# Patient Record
Sex: Female | Born: 1985
Health system: Southern US, Community
[De-identification: ages and names within clinical notes are randomized; demographics above are authoritative.]

## PROBLEM LIST (undated history)

## (undated) DIAGNOSIS — D509 Iron deficiency anemia, unspecified: Secondary | ICD-10-CM

## (undated) DIAGNOSIS — Z52819 Egg (Oocyte) donor, unspecified: Secondary | ICD-10-CM

## (undated) DIAGNOSIS — B019 Varicella without complication: Secondary | ICD-10-CM

## (undated) HISTORY — DX: Egg (oocyte) donor, unspecified: Z52.819

## (undated) HISTORY — DX: Iron deficiency anemia, unspecified: D50.9

## (undated) HISTORY — DX: Varicella without complication: B01.9

## (undated) HISTORY — PX: OTHER SURGICAL HISTORY: SHX169

---

## 2005-09-25 LAB — CONVERTED CEMR LAB: Pap Smear: NORMAL

## 2006-10-17 ENCOUNTER — Ambulatory Visit: Payer: Self-pay | Admitting: Internal Medicine

## 2006-10-17 LAB — CONVERTED CEMR LAB
ALT: 11 units/L (ref 0–40)
AST: 20 units/L (ref 0–37)
Alkaline Phosphatase: 40 units/L (ref 39–117)
Basophils Absolute: 0.1 10*3/uL (ref 0.0–0.1)
Basophils Relative: 1.2 % — ABNORMAL HIGH (ref 0.0–1.0)
Chloride: 105 meq/L (ref 96–112)
Cholesterol: 151 mg/dL (ref 0–200)
Eosinophils Relative: 1.5 % (ref 0.0–5.0)
Ferritin: 2.2 ng/mL — ABNORMAL LOW (ref 10.0–291.0)
GFR calc Af Amer: 115 mL/min
GFR calc non Af Amer: 95 mL/min
Glucose, Bld: 92 mg/dL (ref 70–99)
HCT: 25.9 % — ABNORMAL LOW (ref 36.0–46.0)
HDL: 36.8 mg/dL — ABNORMAL LOW (ref >39.0)
LDL Cholesterol: 93 mg/dL (ref 0–99)
Platelets: 262 10*3/uL (ref 150–400)
Potassium: 3.7 meq/L (ref 3.5–5.1)
RBC: 4.22 M/uL (ref 3.87–5.11)
RDW: 19.4 % — ABNORMAL HIGH (ref 11.5–14.6)
Sodium: 139 meq/L (ref 135–145)
Total Protein: 8.3 g/dL (ref 6.0–8.3)
VLDL: 22 mg/dL (ref 0–40)
WBC: 4.3 10*3/uL — ABNORMAL LOW (ref 4.5–10.5)

## 2006-10-18 ENCOUNTER — Ambulatory Visit: Payer: Self-pay | Admitting: Internal Medicine

## 2006-10-18 LAB — CONVERTED CEMR LAB
Hgb A2 Quant: 4.9 % — ABNORMAL HIGH (ref 2.2–3.2)
Hgb A: 93.2 % — ABNORMAL LOW (ref 96.8–97.8)

## 2006-10-22 ENCOUNTER — Encounter: Payer: Self-pay | Admitting: Internal Medicine

## 2006-10-24 ENCOUNTER — Ambulatory Visit: Payer: Self-pay | Admitting: Internal Medicine

## 2006-10-24 ENCOUNTER — Other Ambulatory Visit: Admission: RE | Admit: 2006-10-24 | Discharge: 2006-10-24 | Payer: Self-pay | Admitting: Internal Medicine

## 2006-10-24 ENCOUNTER — Encounter: Payer: Self-pay | Admitting: Internal Medicine

## 2006-10-24 LAB — CONVERTED CEMR LAB

## 2006-12-26 ENCOUNTER — Ambulatory Visit: Payer: Self-pay | Admitting: Internal Medicine

## 2007-03-06 ENCOUNTER — Ambulatory Visit: Payer: Self-pay | Admitting: Internal Medicine

## 2007-03-15 ENCOUNTER — Encounter: Payer: Self-pay | Admitting: Internal Medicine

## 2007-04-25 ENCOUNTER — Ambulatory Visit: Payer: Self-pay | Admitting: Internal Medicine

## 2007-04-25 DIAGNOSIS — D508 Other iron deficiency anemias: Secondary | ICD-10-CM

## 2007-04-25 DIAGNOSIS — L989 Disorder of the skin and subcutaneous tissue, unspecified: Secondary | ICD-10-CM | POA: Insufficient documentation

## 2007-05-06 ENCOUNTER — Telehealth (INDEPENDENT_AMBULATORY_CARE_PROVIDER_SITE_OTHER): Payer: Self-pay | Admitting: *Deleted

## 2007-10-11 ENCOUNTER — Ambulatory Visit: Payer: Self-pay | Admitting: Internal Medicine

## 2007-10-11 LAB — CONVERTED CEMR LAB
Bilirubin Urine: NEGATIVE
Urobilinogen, UA: 0.2

## 2007-10-13 LAB — CONVERTED CEMR LAB
Albumin: 3.7 g/dL (ref 3.5–5.2)
Alkaline Phosphatase: 33 units/L — ABNORMAL LOW (ref 39–117)
BUN: 8 mg/dL (ref 6–23)
Basophils Absolute: 0 10*3/uL (ref 0.0–0.1)
Cholesterol: 149 mg/dL (ref 0–200)
GFR calc Af Amer: 133 mL/min
HDL: 32.8 mg/dL — ABNORMAL LOW (ref >39.0)
Hemoglobin: 10.9 g/dL — ABNORMAL LOW (ref 12.0–15.0)
LDL Cholesterol: 94 mg/dL (ref 0–99)
Lymphocytes Relative: 47.6 % — ABNORMAL HIGH (ref 12.0–46.0)
MCHC: 32.7 g/dL (ref 30.0–36.0)
Monocytes Absolute: 0.3 10*3/uL (ref 0.2–0.7)
Monocytes Relative: 7.8 % (ref 3.0–11.0)
Neutro Abs: 1.7 10*3/uL (ref 1.4–7.7)
Platelets: 264 10*3/uL (ref 150–400)
Potassium: 4.5 meq/L (ref 3.5–5.1)
RDW: 23.5 % — ABNORMAL HIGH (ref 11.5–14.6)
Triglycerides: 111 mg/dL (ref 0–149)

## 2007-10-18 ENCOUNTER — Ambulatory Visit: Payer: Self-pay | Admitting: Internal Medicine

## 2007-10-18 ENCOUNTER — Other Ambulatory Visit: Admission: RE | Admit: 2007-10-18 | Discharge: 2007-10-18 | Payer: Self-pay | Admitting: Internal Medicine

## 2007-10-18 ENCOUNTER — Encounter: Payer: Self-pay | Admitting: Internal Medicine

## 2007-10-18 DIAGNOSIS — R51 Headache: Secondary | ICD-10-CM | POA: Insufficient documentation

## 2007-10-18 DIAGNOSIS — R519 Headache, unspecified: Secondary | ICD-10-CM | POA: Insufficient documentation

## 2007-12-17 ENCOUNTER — Ambulatory Visit: Payer: Self-pay | Admitting: Internal Medicine

## 2007-12-17 LAB — CONVERTED CEMR LAB: Hemoglobin: 11.3 g/dL

## 2009-02-02 ENCOUNTER — Ambulatory Visit: Payer: Self-pay | Admitting: Internal Medicine

## 2009-02-02 LAB — CONVERTED CEMR LAB
ALT: 10 units/L (ref 0–35)
Albumin: 4.1 g/dL (ref 3.5–5.2)
Basophils Absolute: 0 10*3/uL (ref 0.0–0.1)
CO2: 29 meq/L (ref 19–32)
Calcium: 9.4 mg/dL (ref 8.4–10.5)
Creatinine, Ser: 0.7 mg/dL (ref 0.4–1.2)
Eosinophils Absolute: 0 10*3/uL (ref 0.0–0.7)
Glucose, Bld: 87 mg/dL (ref 70–99)
Glucose, Urine, Semiquant: NEGATIVE
HDL: 36 mg/dL — ABNORMAL LOW (ref >39.00)
Hemoglobin: 11.5 g/dL — ABNORMAL LOW (ref 12.0–15.0)
Ketones, urine, test strip: NEGATIVE
Lymphocytes Relative: 52.1 % — ABNORMAL HIGH (ref 12.0–46.0)
MCHC: 33.2 g/dL (ref 30.0–36.0)
Monocytes Absolute: 0.3 10*3/uL (ref 0.1–1.0)
Neutro Abs: 1.1 10*3/uL — ABNORMAL LOW (ref 1.4–7.7)
Neutrophils Relative %: 35.7 % — ABNORMAL LOW (ref 43.0–77.0)
RDW: 25.9 % — ABNORMAL HIGH (ref 11.5–14.6)
Specific Gravity, Urine: >=1.03
TSH: 2.13 microintl units/mL (ref 0.35–5.50)
Total Bilirubin: 0.5 mg/dL (ref 0.3–1.2)
Total Protein: 7.9 g/dL (ref 6.0–8.3)
Triglycerides: 97 mg/dL (ref 0.0–149.0)
pH: 5.5

## 2009-02-09 ENCOUNTER — Encounter: Payer: Self-pay | Admitting: Internal Medicine

## 2009-02-09 ENCOUNTER — Ambulatory Visit: Payer: Self-pay | Admitting: Internal Medicine

## 2009-02-09 ENCOUNTER — Other Ambulatory Visit: Admission: RE | Admit: 2009-02-09 | Discharge: 2009-02-09 | Payer: Self-pay | Admitting: Internal Medicine

## 2009-05-21 ENCOUNTER — Ambulatory Visit: Payer: Self-pay | Admitting: Internal Medicine

## 2009-05-21 DIAGNOSIS — M542 Cervicalgia: Secondary | ICD-10-CM | POA: Insufficient documentation

## 2009-08-31 ENCOUNTER — Ambulatory Visit: Payer: Self-pay | Admitting: Internal Medicine

## 2009-08-31 ENCOUNTER — Other Ambulatory Visit: Admission: RE | Admit: 2009-08-31 | Discharge: 2009-08-31 | Payer: Self-pay | Admitting: Internal Medicine

## 2009-08-31 DIAGNOSIS — R87610 Atypical squamous cells of undetermined significance on cytologic smear of cervix (ASC-US): Secondary | ICD-10-CM

## 2009-08-31 LAB — CONVERTED CEMR LAB: Hemoglobin: 11.6 g/dL

## 2011-02-10 NOTE — Assessment & Plan Note (Signed)
[facility]                            [facility] OFFICE NOTE   NAME:[name]                        MRN:          [id number]  DATE:10/18/2006                            DOB:          1986/07/03    CHIEF COMPLAINT:  New patient to discuss lab.   HISTORY OF PRESENT ILLNESS:  [name] is a 25 year old nonsmoking,  single, African American female who comes today for first time visit.  Called in because of abnormal lab test by our office because she had had  lab work done for a new patient physical and her hemoglobin was 8.  She  states that she has probably always been anemic and her mother told her  that she had some kind of anemia at birth but she has been off and on  iron throughout her life, but has side effects of stomach aches and  constipation with that so she has not been taking iron for quite a  while.  She has apparently been able to get her hemoglobin up to the 10  or 11 range at some point in her life, and in 2003 however it was in the  6 range.  She has never had a transfusion or acute bleeding.  Her  periods are heavy off and on but last 7 days.  She had been on oral  contraceptives in the past.  The cyclic kind caused breakthrough  bleeding but was on Seasonale for at least a year and did well on that.  She stopped it because of expense.  She has not been on hormonal therapy  for the last 2 years.  She denies any personal easy bleeding or  bruising.  She also has some problems with headaches about 3 times a  month, lasts anywhere from 1-7 days.  Occur onset of pain around her eye  and then spreads, does get sensitivity to light with no nausea and  vomiting.  She has had these kind of headaches for quite a while, but is  more frequent recently.  She is taking ibuprofen 800 mg and occasional  Goody powder with some mild help.  She is scheduled to have a regular  checkup next week with a Pap smear.   PAST MEDICAL HISTORY:  See  database.  Anemia as above, frequent  headaches.  SURGERIES:  None.  She is primiparous.  Last Pap 2006, has had an abnormal LMP October 03, 2006.  Tetanus shot known, may be up to date.   MEDICATIONS:  None.   DRUG ALLERGIES:  NONE.   FAMILY HISTORY:  Positive for anemia in her mother, may be resolved when  she had her hysterectomy.  There is a question about easy bleeding in an  aunt, when she pricks her finger it never stops.  Grandparent with  breast cancer, others with heart disease, no known diabetes.  Mother  does have high triglycerides.   SOCIAL HISTORY:  Lives at home, household of 3.  Six to eight hours of  sleep.  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  Social alcohol.  Caffeine:  Goes  to [employer] 2-3 times a week, an occasional soda.  No tobacco.  She  does not eat red meat, but is not a vegetarian.  See database.   REVIEW OF SYSTEMS:  Negative for chest pain, shortness of breath,  bleeding.  However, she has never had good exercise tolerance, but there  has been no dramatic change most recently.  No adenopathy, fever, weight  loss, weight gain that she can not explain.  Rest of her is negative or  noncontributory.   OBJECTIVE:  Height 5 foot 1-1/2 inches, weight 146, pulse 60 and  regular, blood pressure 120/80.  Is a WDWN,  healthy appearing young adult in no acute distress.  HEENT:  Is grossly normal, eyes are nonicteric.  NECK:  Without masses or significant thyromegaly or nodules.  CHEST:  CTA, BS equal.  CARDIAC:  S1, S2, no gallops or murmurs are heard.  ABDOMEN:  Soft, without any organomegaly, guarding, or rebound.  LYMPH NODES:  No adenopathy.  SKIN:  No acute bruising or petechiae.  NEUROLOGIC:  Grossly intact.   LABORATORY:  So far shows a white count of 4300, hemoglobin of 8.1, MCV  61.5, RDW 19.4, generally normal diff.  Platelet count is 262.  Cholesterol 151, LDL 93 with HDL 36.8,  triglycerides 108.  Ratio is 4.1.  General chemistries normal as well as thyroid and urinalysis.   IMPRESSION:  1. Anemia.  Significant, microcytic.  May be combination of genetic      anemia plus iron deficiency with heavy periods and cardiovascular      adaptation.  No obvious history consistent with bleeding disorder      today and examination is unremarkable.  2. Headaches.  Probably migrainous, increasing frequency.  Examination      is nonrevealing.  Would suggest headache calendar, given to patient      today and will follow up this and avoid triggers such as caffeine      and alcohol.   I have encouraged her, in regard to her anemia, to still try taking iron  and iron rich foods.  In the meantime we will add a ferritin and  hemoglobin electrophoresis onto her labs previously drawn and follow up  at her next visit.     Neta Mends. Panosh, MD  Electronically Signed    WKP/MedQ  DD: 10/18/2006  DT: 10/18/2006  Job #: [id number]

## 2011-12-26 ENCOUNTER — Other Ambulatory Visit: Payer: Self-pay

## 2012-01-02 ENCOUNTER — Other Ambulatory Visit: Payer: Self-pay

## 2012-01-10 ENCOUNTER — Encounter: Payer: Self-pay | Admitting: Internal Medicine

## 2012-04-08 ENCOUNTER — Other Ambulatory Visit: Payer: Self-pay

## 2012-04-15 ENCOUNTER — Encounter: Payer: Self-pay | Admitting: Internal Medicine

## 2012-04-15 DIAGNOSIS — Z0289 Encounter for other administrative examinations: Secondary | ICD-10-CM

## 2012-10-31 ENCOUNTER — Other Ambulatory Visit (INDEPENDENT_AMBULATORY_CARE_PROVIDER_SITE_OTHER): Payer: BC Managed Care – PPO

## 2012-10-31 DIAGNOSIS — Z0000 Encounter for general adult medical examination without abnormal findings: Secondary | ICD-10-CM

## 2012-10-31 LAB — POCT URINALYSIS DIPSTICK
Urobilinogen, UA: 0.2
pH, UA: 6

## 2012-10-31 LAB — HEPATIC FUNCTION PANEL
Albumin: 4.3 g/dL (ref 3.5–5.2)
Alkaline Phosphatase: 49 U/L (ref 39–117)

## 2012-10-31 LAB — CBC WITH DIFFERENTIAL/PLATELET
Basophils Relative: 0.9 % (ref 0.0–3.0)
Eosinophils Absolute: 0.1 10*3/uL (ref 0.0–0.7)
Eosinophils Relative: 1.3 % (ref 0.0–5.0)
Hemoglobin: 10.7 g/dL — ABNORMAL LOW (ref 12.0–15.0)
Lymphocytes Relative: 38.2 % (ref 12.0–46.0)
MCHC: 31.8 g/dL (ref 30.0–36.0)
Neutro Abs: 1.9 10*3/uL (ref 1.4–7.7)
RBC: 4.32 Mil/uL (ref 3.87–5.11)

## 2012-10-31 LAB — LIPID PANEL
HDL: 36.7 mg/dL — ABNORMAL LOW (ref >39.00)
LDL Cholesterol: 98 mg/dL (ref 0–99)
Total CHOL/HDL Ratio: 4
VLDL: 16.2 mg/dL (ref 0.0–40.0)

## 2012-10-31 LAB — BASIC METABOLIC PANEL
CO2: 26 mEq/L (ref 19–32)
Calcium: 8.9 mg/dL (ref 8.4–10.5)
Chloride: 105 mEq/L (ref 96–112)
Sodium: 137 mEq/L (ref 135–145)

## 2012-10-31 LAB — TSH: TSH: 1.2 u[IU]/mL (ref 0.35–5.50)

## 2012-11-01 LAB — RPR

## 2012-11-01 LAB — HIV ANTIBODY (ROUTINE TESTING W REFLEX): HIV: NONREACTIVE

## 2012-11-01 LAB — GC/CHLAMYDIA PROBE AMP, URINE: Chlamydia, Swab/Urine, PCR: NEGATIVE

## 2012-11-06 ENCOUNTER — Other Ambulatory Visit (HOSPITAL_COMMUNITY)
Admission: RE | Admit: 2012-11-06 | Discharge: 2012-11-06 | Disposition: A | Discharge status: Home / Self Care | Payer: BC Managed Care – PPO | Source: Ambulatory Visit | Attending: Internal Medicine | Admitting: Internal Medicine

## 2012-11-06 ENCOUNTER — Encounter: Payer: Self-pay | Admitting: Internal Medicine

## 2012-11-06 ENCOUNTER — Ambulatory Visit (INDEPENDENT_AMBULATORY_CARE_PROVIDER_SITE_OTHER): Payer: BC Managed Care – PPO | Admitting: Internal Medicine

## 2012-11-06 VITALS — BP 118/88 | HR 71 | Temp 97.3°F | Ht 62.0 in | Wt 161.0 lb

## 2012-11-06 DIAGNOSIS — Z23 Encounter for immunization: Secondary | ICD-10-CM

## 2012-11-06 DIAGNOSIS — N760 Acute vaginitis: Secondary | ICD-10-CM | POA: Insufficient documentation

## 2012-11-06 DIAGNOSIS — R109 Unspecified abdominal pain: Secondary | ICD-10-CM

## 2012-11-06 DIAGNOSIS — H1010 Acute atopic conjunctivitis, unspecified eye: Secondary | ICD-10-CM

## 2012-11-06 DIAGNOSIS — Z01419 Encounter for gynecological examination (general) (routine) without abnormal findings: Secondary | ICD-10-CM

## 2012-11-06 DIAGNOSIS — Z3009 Encounter for other general counseling and advice on contraception: Secondary | ICD-10-CM

## 2012-11-06 DIAGNOSIS — M25562 Pain in left knee: Secondary | ICD-10-CM

## 2012-11-06 DIAGNOSIS — Z113 Encounter for screening for infections with a predominantly sexual mode of transmission: Secondary | ICD-10-CM | POA: Insufficient documentation

## 2012-11-06 DIAGNOSIS — Z0000 Encounter for general adult medical examination without abnormal findings: Secondary | ICD-10-CM

## 2012-11-06 DIAGNOSIS — M25569 Pain in unspecified knee: Secondary | ICD-10-CM

## 2012-11-06 DIAGNOSIS — G43909 Migraine, unspecified, not intractable, without status migrainosus: Secondary | ICD-10-CM

## 2012-11-06 DIAGNOSIS — D649 Anemia, unspecified: Secondary | ICD-10-CM

## 2012-11-06 MED ORDER — FLUTICASONE PROPIONATE 50 MCG/ACT NA SUSP: NASAL | Status: DC

## 2012-11-06 NOTE — Patient Instructions (Addendum)
Will notify you  of Pap and screening tests when available. Sign out for my chart to review your results. I think your anemia is the same it's just been in the past. You have a low HDL which is probably genetic avoid trans-fat fast foods tobacco and continue exercise.  i agree with considering mirena. This is a good option for people who get side effects with the oral contraceptives. Have you see a gynecologist if you wish to do that. Suggest sports medicine at [place] [facility].   In regard to your knee problem.  I am uncertain why you have the lower of trauma no pain it could be constipation monitor this carefully eat correctly for optimal bowel function if this is recurrent and persistent please contact us for followup evaluation.  Try nasal cortisone as a controller medicine for your seasonal allergies. When taking every day it is very good with your antihistamine to decrease flareups.  Preventive Care for Adults, Female A healthy lifestyle and preventive care can promote health and wellness. Preventive health guidelines for women include the following key practices.  A routine yearly physical is a good way to check with your caregiver about your health and preventive screening. It is a chance to share any concerns and updates on your health, and to receive a thorough exam.  Visit your dentist for a routine exam and preventive care every 6 months. Brush your teeth twice a day and floss once a day. Good oral hygiene prevents tooth decay and gum disease.  The frequency of eye exams is based on your age, health, family medical history, use of contact lenses, and other factors. Follow your caregiver's recommendations for frequency of eye exams.  Eat a healthy diet. Foods like vegetables, fruits, whole grains, low-fat dairy products, and lean protein foods contain the nutrients you need without too many calories. Decrease your intake of foods high in solid fats, added sugars, and  salt. Eat the right amount of calories for you.Get information about a proper diet from your caregiver, if necessary.  Regular physical exercise is one of the most important things you can do for your health. Most adults should get at least 150 minutes of moderate-intensity exercise (any activity that increases your heart rate and causes you to sweat) each week. In addition, most adults need muscle-strengthening exercises on 2 or more days a week.  Maintain a healthy weight. The body mass index (BMI) is a screening tool to identify possible weight problems. It provides an estimate of body fat based on height and weight. Your caregiver can help determine your BMI, and can help you achieve or maintain a healthy weight.For adults 20 years and older:  A BMI below 18.5 is considered underweight.  A BMI of 18.5 to 24.9 is normal.  A BMI of 25 to 29.9 is considered overweight.  A BMI of 30 and above is considered obese.  Maintain normal blood lipids and cholesterol levels by exercising and minimizing your intake of saturated fat. Eat a balanced diet with plenty of fruit and vegetables. Blood tests for lipids and cholesterol should begin at age 51 and be repeated every 5 years. If your lipid or cholesterol levels are high, you are over 50, or you are at high risk for heart disease, you may need your cholesterol levels checked more frequently.Ongoing high lipid and cholesterol levels should be treated with medicines if diet and exercise are not effective.  If you smoke, find out from your caregiver  how to quit. If you do not use tobacco, do not start.  If you are pregnant, do not drink alcohol. If you are breastfeeding, be very cautious about drinking alcohol. If you are not pregnant and choose to drink alcohol, do not exceed 1 drink per day. One drink is considered to be 12 ounces (355 mL) of beer, 5 ounces (148 mL) of wine, or 1.5 ounces (44 mL) of liquor.  Avoid use of street drugs. Do not share  needles with anyone. Ask for help if you need support or instructions about stopping the use of drugs.  High blood pressure causes heart disease and increases the risk of stroke. Your blood pressure should be checked at least every 1 to 2 years. Ongoing high blood pressure should be treated with medicines if weight loss and exercise are not effective.  If you are 49 to 29 years old, ask your caregiver if you should take aspirin to prevent strokes.  Diabetes screening involves taking a blood sample to check your fasting blood sugar level. This should be done once every 3 years, after age 21, if you are within normal weight and without risk factors for diabetes. Testing should be considered at a younger age or be carried out more frequently if you are overweight and have at least 1 risk factor for diabetes.  Breast cancer screening is essential preventive care for women. You should practice "breast self-awareness." This means understanding the normal appearance and feel of your breasts and may include breast self-examination. Any changes detected, no matter how small, should be reported to a caregiver. Women in their 48s and 30s should have a clinical breast exam (CBE) by a caregiver as part of a regular health exam every 1 to 3 years. After age 83, women should have a CBE every year. Starting at age 107, women should consider having a mammography (breast X-ray test) every year. Women who have a family history of breast cancer should talk to their caregiver about genetic screening. Women at a high risk of breast cancer should talk to their caregivers about having magnetic resonance imaging (MRI) and a mammography every year.  The Pap test is a screening test for cervical cancer. A Pap test can show cell changes on the cervix that might become cervical cancer if left untreated. A Pap test is a procedure in which cells are obtained and examined from the lower end of the uterus (cervix).  Women should have a  Pap test starting at age 3.  Between ages 16 and 37, Pap tests should be repeated every 2 years.  Beginning at age 66, you should have a Pap test every 3 years as long as the past 3 Pap tests have been normal.  Some women have medical problems that increase the chance of getting cervical cancer. Talk to your caregiver about these problems. It is especially important to talk to your caregiver if a new problem develops soon after your last Pap test. In these cases, your caregiver may recommend more frequent screening and Pap tests.  The above recommendations are the same for women who have or have not gotten the vaccine for human papillomavirus (HPV).  If you had a hysterectomy for a problem that was not cancer or a condition that could lead to cancer, then you no longer need Pap tests. Even if you no longer need a Pap test, a regular exam is a good idea to make sure no other problems are starting.  If you are between  ages 28 and 49, and you have had normal Pap tests going back 10 years, you no longer need Pap tests. Even if you no longer need a Pap test, a regular exam is a good idea to make sure no other problems are starting.  If you have had past treatment for cervical cancer or a condition that could lead to cancer, you need Pap tests and screening for cancer for at least 20 years after your treatment.  If Pap tests have been discontinued, risk factors (such as a new sexual partner) need to be reassessed to determine if screening should be resumed.  The HPV test is an additional test that may be used for cervical cancer screening. The HPV test looks for the virus that can cause the cell changes on the cervix. The cells collected during the Pap test can be tested for HPV. The HPV test could be used to screen women aged 19 years and older, and should be used in women of any age who have unclear Pap test results. After the age of 62, women should have HPV testing at the same frequency as a Pap  test.  Colorectal cancer can be detected and often prevented. Most routine colorectal cancer screening begins at the age of 28 and continues through age 39. However, your caregiver may recommend screening at an earlier age if you have risk factors for colon cancer. On a yearly basis, your caregiver may provide home test kits to check for hidden blood in the stool. Use of a small camera at the end of a tube, to directly examine the colon (sigmoidoscopy or colonoscopy), can detect the earliest forms of colorectal cancer. Talk to your caregiver about this at age 64, when routine screening begins. Direct examination of the colon should be repeated every 5 to 10 years through age 66, unless early forms of pre-cancerous polyps or small growths are found.  Hepatitis C blood testing is recommended for all people born from [birth year] through 1965 and any individual with known risks for hepatitis C.  Practice safe sex. Use condoms and avoid high-risk sexual practices to reduce the spread of sexually transmitted infections (STIs). STIs include gonorrhea, chlamydia, syphilis, trichomonas, herpes, HPV, and human immunodeficiency virus (HIV). Herpes, HIV, and HPV are viral illnesses that have no cure. They can result in disability, cancer, and death. Sexually active women aged 75 and younger should be checked for chlamydia. Older women with new or multiple partners should also be tested for chlamydia. Testing for other STIs is recommended if you are sexually active and at increased risk.  Osteoporosis is a disease in which the bones lose minerals and strength with aging. This can result in serious bone fractures. The risk of osteoporosis can be identified using a bone density scan. Women ages 8 and over and women at risk for fractures or osteoporosis should discuss screening with their caregivers. Ask your caregiver whether you should take a calcium supplement or vitamin D to reduce the rate of osteoporosis.  Menopause can  be associated with physical symptoms and risks. Hormone replacement therapy is available to decrease symptoms and risks. You should talk to your caregiver about whether hormone replacement therapy is right for you.  Use sunscreen with sun protection factor (SPF) of 30 or more. Apply sunscreen liberally and repeatedly throughout the day. You should seek shade when your shadow is shorter than you. Protect yourself by wearing long sleeves, pants, a wide-brimmed hat, and sunglasses year round, whenever you are outdoors.  Once a month, do a whole body skin exam, using a mirror to look at the skin on your back. Notify your caregiver of new moles, moles that have irregular borders, moles that are larger than a pencil eraser, or moles that have changed in shape or color.  Stay current with required immunizations.  Influenza. You need a dose every fall (or winter). The composition of the flu vaccine changes each year, so being vaccinated once is not enough.  Pneumococcal polysaccharide. You need 1 to 2 doses if you smoke cigarettes or if you have certain chronic medical conditions. You need 1 dose at age 32 (or older) if you have never been vaccinated.  Tetanus, diphtheria, pertussis (Tdap, Td). Get 1 dose of Tdap vaccine if you are younger than age 57, are over 46 and have contact with an infant, are a Research scientist (physical sciences), are pregnant, or simply want to be protected from whooping cough. After that, you need a Td booster dose every 10 years. Consult your caregiver if you have not had at least 3 tetanus and diphtheria-containing shots sometime in your life or have a deep or dirty wound.  HPV. You need this vaccine if you are a woman age 1 or younger. The vaccine is given in 3 doses over 6 months.  Measles, mumps, rubella (MMR). You need at least 1 dose of MMR if you were born in 1957 or later. You may also need a second dose.  Meningococcal. If you are age 49 to 48 and a first-year college student living in  a residence hall, or have one of several medical conditions, you need to get vaccinated against meningococcal disease. You may also need additional booster doses.  Zoster (shingles). If you are age 67 or older, you should get this vaccine.  Varicella (chickenpox). If you have never had chickenpox or you were vaccinated but received only 1 dose, talk to your caregiver to find out if you need this vaccine.  Hepatitis A. You need this vaccine if you have a specific risk factor for hepatitis A virus infection or you simply wish to be protected from this disease. The vaccine is usually given as 2 doses, 6 to 18 months apart.  Hepatitis B. You need this vaccine if you have a specific risk factor for hepatitis B virus infection or you simply wish to be protected from this disease. The vaccine is given in 3 doses, usually over 6 months. Preventive Services / Frequency Ages 75 to 64  Blood pressure check.** / Every 1 to 2 years.  Lipid and cholesterol check.** / Every 5 years beginning at age 74.  Clinical breast exam.** / Every 3 years for women in their 63s and 30s.  Pap test.** / Every 2 years from ages 73 through 48. Every 3 years starting at age 9 through age 54 or 83 with a history of 3 consecutive normal Pap tests.  HPV screening.** / Every 3 years from ages 60 through ages 69 to 49 with a history of 3 consecutive normal Pap tests.  Hepatitis C blood test.** / For any individual with known risks for hepatitis C.  Skin self-exam. / Monthly.  Influenza immunization.** / Every year.  Pneumococcal polysaccharide immunization.** / 1 to 2 doses if you smoke cigarettes or if you have certain chronic medical conditions.  Tetanus, diphtheria, pertussis (Tdap, Td) immunization. / A one-time dose of Tdap vaccine. After that, you need a Td booster dose every 10 years.  HPV immunization. / 3 doses over  6 months, if you are 55 and younger.  Measles, mumps, rubella (MMR) immunization. / You need at  least 1 dose of MMR if you were born in 1957 or later. You may also need a second dose.  Meningococcal immunization. / 1 dose if you are age 66 to 68 and a first-year college student living in a residence hall, or have one of several medical conditions, you need to get vaccinated against meningococcal disease. You may also need additional booster doses.  Varicella immunization.** / Consult your caregiver.  Hepatitis A immunization.** / Consult your caregiver. 2 doses, 6 to 18 months apart.  Hepatitis B immunization.** / Consult your caregiver. 3 doses usually over 6 months. Ages 72 to 56  Blood pressure check.** / Every 1 to 2 years.  Lipid and cholesterol check.** / Every 5 years beginning at age 91.  Clinical breast exam.** / Every year after age 12.  Mammogram.** / Every year beginning at age 70 and continuing for as long as you are in good health. Consult with your caregiver.  Pap test.** / Every 3 years starting at age 56 through age 45 or 49 with a history of 3 consecutive normal Pap tests.  HPV screening.** / Every 3 years from ages 51 through ages 83 to 28 with a history of 3 consecutive normal Pap tests.  Fecal occult blood test (FOBT) of stool. / Every year beginning at age 22 and continuing until age 90. You may not need to do this test if you get a colonoscopy every 10 years.  Flexible sigmoidoscopy or colonoscopy.** / Every 5 years for a flexible sigmoidoscopy or every 10 years for a colonoscopy beginning at age 23 and continuing until age 33.  Hepatitis C blood test.** / For all people born from [birth year] through 1967 and any individual with known risks for hepatitis C.  Skin self-exam. / Monthly.  Influenza immunization.** / Every year.  Pneumococcal polysaccharide immunization.** / 1 to 2 doses if you smoke cigarettes or if you have certain chronic medical conditions.  Tetanus, diphtheria, pertussis (Tdap, Td) immunization.** / A one-time dose of Tdap vaccine. After  that, you need a Td booster dose every 10 years.  Measles, mumps, rubella (MMR) immunization. / You need at least 1 dose of MMR if you were born in 1957 or later. You may also need a second dose.  Varicella immunization.** / Consult your caregiver.  Meningococcal immunization.** / Consult your caregiver.  Hepatitis A immunization.** / Consult your caregiver. 2 doses, 6 to 18 months apart.  Hepatitis B immunization.** / Consult your caregiver. 3 doses, usually over 6 months. Ages 36 and over  Blood pressure check.** / Every 1 to 2 years.  Lipid and cholesterol check.** / Every 5 years beginning at age 33.  Clinical breast exam.** / Every year after age 61.  Mammogram.** / Every year beginning at age 26 and continuing for as long as you are in good health. Consult with your caregiver.  Pap test.** / Every 3 years starting at age 67 through age 23 or 33 with a 3 consecutive normal Pap tests. Testing can be stopped between 65 and 70 with 3 consecutive normal Pap tests and no abnormal Pap or HPV tests in the past 10 years.  HPV screening.** / Every 3 years from ages 32 through ages 33 or 102 with a history of 3 consecutive normal Pap tests. Testing can be stopped between 65 and 70 with 3 consecutive normal Pap tests and no abnormal  Pap or HPV tests in the past 10 years.  Fecal occult blood test (FOBT) of stool. / Every year beginning at age 64 and continuing until age 18. You may not need to do this test if you get a colonoscopy every 10 years.  Flexible sigmoidoscopy or colonoscopy.** / Every 5 years for a flexible sigmoidoscopy or every 10 years for a colonoscopy beginning at age 29 and continuing until age 38.  Hepatitis C blood test.** / For all people born from [birth year] through 1967 and any individual with known risks for hepatitis C.  Osteoporosis screening.** / A one-time screening for women ages 17 and over and women at risk for fractures or osteoporosis.  Skin self-exam. /  Monthly.  Influenza immunization.** / Every year.  Pneumococcal polysaccharide immunization.** / 1 dose at age 24 (or older) if you have never been vaccinated.  Tetanus, diphtheria, pertussis (Tdap, Td) immunization. / A one-time dose of Tdap vaccine if you are over 65 and have contact with an infant, are a Research scientist (physical sciences), or simply want to be protected from whooping cough. After that, you need a Td booster dose every 10 years.  Varicella immunization.** / Consult your caregiver.  Meningococcal immunization.** / Consult your caregiver.  Hepatitis A immunization.** / Consult your caregiver. 2 doses, 6 to 18 months apart.  Hepatitis B immunization.** / Check with your caregiver. 3 doses, usually over 6 months. ** Family history and personal history of risk and conditions may change your caregiver's recommendations. Document Released: 11/07/2001 Document Revised: 12/04/2011 Document Reviewed: 02/06/2011 [facility] Patient Information 2013 [place], [place].

## 2012-11-06 NOTE — Progress Notes (Signed)
Chief Complaint  Patient presents with  . Annual Exam    Complains of pain in her stomach.  Tends to be after she eats.  She is treating with Gas-X.  Also would like STD testing with her pap.  Also has some allergy problems.  Stated she is eatting Claritin like candy.  Has had migraines and was taking generic Imitrex.  Would like something else to take.  Would like to discuss BC.  Specifically the Mirena.    HPI: Patient comes in today for Preventive Health Care visit  Last visit was over 3 years ago . She is generally well but needs PV and pap and has a couple of issues as above.  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  Contraception periods: Past; hx of nuvaring  And just stopped. Some concern about use   Has used OCPS in the past  But made  her sick . ? If mirena an option   Low abd bloating   Off an on for 3 weeks   Uncertain.  cause had episode  To left shoulder x 1 dau.  No fever NV some constipation in the past.    No dysmenorrhea.  Not taking lots of nsaids . No change vag discharge  No vomiting  No hx of ovarian cysts    Working on knee  Problems  Has seen SM .  wendover;  ocass aleve.  Needs more help  Bothers her with exercise .   Allergy sx nose and eyes in spring usually no fever asthma sx.   Gets ocass migraine and imitrex can help but makes her nauseous asks about other options  Frequently  every 1-2 months  ROS:  GEN/ HEENT: No fever, significant weight changes sweats  vision problems hearing changes, CV/ PULM; No chest pain shortness of breath cough, syncope,edema  change in exercise tolerance. GI /GU: No adominal pain, vomiting, change in bowel habits. No blood in the stool. No significant GU symptoms. SKIN/HEME: ,no acute skin rashes suspicious lesions or bleeding. No lymphadenopathy, nodules, masses.  NEURO/ PSYCH:  No neurologic signs such as weakness numbness. No depression anxiety. IMM/ Allergy: No unusual infections.  Allergy .   REST of 12 system review  negative except as per HPI   Past Medical History  Diagnosis Date  . Anemia, iron deficiency     With elevated hemoglobin A2  . Chicken pox   . Egg donor     Family History  Problem Relation Age of Onset  . Hyperlipidemia Mother   . Miscarriages / [place] Mother     Mother has 1 miscarriage  . Cancer Father     Lung Cancer  . Learning disabilities Sister     Sister has Cerebral Palsy and Epilepsy  . Mental retardation Sister   . Cancer Maternal Grandfather     Liver Cancer    History   Social History  . Marital Status: Single    Spouse Name: N/A    Number of Children: N/A  . Years of Education: N/A   Social History Main Topics  . Smoking status: Never Smoker   . Smokeless tobacco: None  . Alcohol Use: None  . Drug Use: None  . Sexually Active: None   Other Topics Concern  . None   Social History Narrative   hhof 3 plus dog   Working  Animator.   School [employer]  Senior  BA .  6 hours  No ets. Tobacco   caffiene  Once coke zero.   2-3    ETOH:  Wine weekends.    Gym  4 x per week.     Working on knee  Problems  Has seen SM .  wendover  ocass aleve.           Outpatient Encounter Prescriptions as of 11/06/2012  Medication Sig Dispense Refill  . loratadine (CLARITIN) 10 MG tablet Take 10 mg by mouth daily.      . fluticasone (FLONASE) 50 MCG/ACT nasal spray 2 spray each nostril qd  16 g  3   No facility-administered encounter medications on file as of 11/06/2012.    EXAM:  BP 118/88  Pulse 71  Temp(Src) 97.3 F (36.3 C) (Oral)  Ht 5\' 2"  (1.575 m)  Wt 161 lb (73.029 kg)  BMI 29.44 kg/m2  SpO2 97%  LMP 10/21/2012  Body mass index is 29.44 kg/(m^2).  Physical Exam: Vital signs reviewed [id number] is a well-developed well-nourished alert cooperative   female who appears her stated age in no acute distress.  HEENT: normocephalic atraumatic , Eyes: PERRL EOM's full, conjunctiva clear, Nares: paten,t no deformity discharge or tenderness. Mild  congestion , Ears: no deformity EAC's clear TMs with normal landmarks. Mouth: clear OP, no lesions, edema.  Moist mucous membranes. Dentition in adequate repair. NECK: supple without masses, thyromegaly or bruits. CHEST/PULM:  Clear to auscultation and percussion breath sounds equal no wheeze , rales or rhonchi. No chest wall deformities or tenderness. CV: PMI is nondisplaced, S1 S2 no gallops, murmurs, rubs. Peripheral pulses are full without delay.No JVD .  Breast: normal by inspection . No dimpling, discharge, masses, tenderness or discharge .  ABDOMEN: Bowel sounds normal nontender  No guard or rebound, no hepato splenomegal no CVA tenderness.  No hernia. Extremtities:  No clubbing cyanosis or edema, no acute joint swelling or redness no focal atrophy knee  No  overt deformity nl rom  NEURO:  Oriented x3, cranial nerves 3-12 appear to be intact, no obvious focal weakness,gait within normal limits no abnormal reflexes or asymmetrical SKIN: No acute rashes normal turgor, color, no bruising or petechiae. PSYCH: Oriented, good eye contact, no obvious depression anxiety, cognition and judgment appear normal. LN: no cervical axillary inguinal adenopathy Pelvic: NL ext GU, labia clear without lesions or rash . Vagina no lesions . Creamy grey discharge Cervix: clear  UTERUS: Neg CMT Adnexa:  clear no masses . obvious   PAP done   Lab Results  Component Value Date   WBC 3.9* 10/31/2012   HGB 10.7* 10/31/2012   HCT 33.6* 10/31/2012   PLT 253.0 10/31/2012   GLUCOSE 95 10/31/2012   CHOL 151 10/31/2012   TRIG 81.0 10/31/2012   HDL 36.70* 10/31/2012   LDLCALC 98 10/31/2012   ALT 12 10/31/2012   AST 19 10/31/2012   NA 137 10/31/2012   K 4.0 10/31/2012   CL 105 10/31/2012   CREATININE 0.8 10/31/2012   BUN 7 10/31/2012   CO2 26 10/31/2012   TSH 1.20 10/31/2012    ASSESSMENT AND PLAN:  Discussed the following assessment and plan:  Visit for preventive health examination - Plan: PAP [[place]]  Migraine headache -  contact us about  insuance  med preference and can pick from this  avoid triggers  Abdominal pain, unspecified site - lower off and on for 3 weks no new sx  Need for Tdap vaccination - Plan: Tdap vaccine greater than or equal to 7yo  IM  Encounter for routine gynecological examination - Plan: PAP [[place]]  Other general counseling and advice for contraceptive management - agree mirena good option see gyne about this  Knee pain, bilateral - mechanical. suggest [facility] for help  Allergic rhinoconjunctivitis, seasonal and perennial - trial nasal steroids  Anemia - hx iron def and elevated hg a2  optimize iron.  Patient Care Team: Rocco Serene, MD as PCP - General (Dermatology) Patient Instructions  Will notify you  of Pap and screening tests when available. Sign out for my chart to review your results. I think your anemia is the same it's just been in the past. You have a low HDL which is probably genetic avoid trans-fat fast foods tobacco and continue exercise.  i agree with considering mirena. This is a good option for people who get side effects with the oral contraceptives. Have you see a gynecologist if you wish to do that. Suggest sports medicine at [place] [facility].   In regard to your knee problem.  I am uncertain why you have the lower of trauma no pain it could be constipation monitor this carefully eat correctly for optimal bowel function if this is recurrent and persistent please contact us for followup evaluation.  Try nasal cortisone as a controller medicine for your seasonal allergies. When taking every day it is very good with your antihistamine to decrease flareups.  Preventive Care for Adults, Female A healthy lifestyle and preventive care can promote health and wellness. Preventive health guidelines for women include the following key practices.  A routine yearly physical is a good way to check with your caregiver about your health and preventive  screening. It is a chance to share any concerns and updates on your health, and to receive a thorough exam.  Visit your dentist for a routine exam and preventive care every 6 months. Brush your teeth twice a day and floss once a day. Good oral hygiene prevents tooth decay and gum disease.  The frequency of eye exams is based on your age, health, family medical history, use of contact lenses, and other factors. Follow your caregiver's recommendations for frequency of eye exams.  Eat a healthy diet. Foods like vegetables, fruits, whole grains, low-fat dairy products, and lean protein foods contain the nutrients you need without too many calories. Decrease your intake of foods high in solid fats, added sugars, and salt. Eat the right amount of calories for you.Get information about a proper diet from your caregiver, if necessary.  Regular physical exercise is one of the most important things you can do for your health. Most adults should get at least 150 minutes of moderate-intensity exercise (any activity that increases your heart rate and causes you to sweat) each week. In addition, most adults need muscle-strengthening exercises on 2 or more days a week.  Maintain a healthy weight. The body mass index (BMI) is a screening tool to identify possible weight problems. It provides an estimate of body fat based on height and weight. Your caregiver can help determine your BMI, and can help you achieve or maintain a healthy weight.For adults 20 years and older:  A BMI below 18.5 is considered underweight.  A BMI of 18.5 to 24.9 is normal.  A BMI of 25 to 29.9 is considered overweight.  A BMI of 30 and above is considered obese.  Maintain normal blood lipids and cholesterol levels by exercising and minimizing your intake of saturated fat. Eat a balanced  diet with plenty of fruit and vegetables. Blood tests for lipids and cholesterol should begin at age 82 and be repeated every 5 years. If your lipid or  cholesterol levels are high, you are over 50, or you are at high risk for heart disease, you may need your cholesterol levels checked more frequently.Ongoing high lipid and cholesterol levels should be treated with medicines if diet and exercise are not effective.  If you smoke, find out from your caregiver how to quit. If you do not use tobacco, do not start.  If you are pregnant, do not drink alcohol. If you are breastfeeding, be very cautious about drinking alcohol. If you are not pregnant and choose to drink alcohol, do not exceed 1 drink per day. One drink is considered to be 12 ounces (355 mL) of beer, 5 ounces (148 mL) of wine, or 1.5 ounces (44 mL) of liquor.  Avoid use of street drugs. Do not share needles with anyone. Ask for help if you need support or instructions about stopping the use of drugs.  High blood pressure causes heart disease and increases the risk of stroke. Your blood pressure should be checked at least every 1 to 2 years. Ongoing high blood pressure should be treated with medicines if weight loss and exercise are not effective.  If you are 25 to 29 years old, ask your caregiver if you should take aspirin to prevent strokes.  Diabetes screening involves taking a blood sample to check your fasting blood sugar level. This should be done once every 3 years, after age 55, if you are within normal weight and without risk factors for diabetes. Testing should be considered at a younger age or be carried out more frequently if you are overweight and have at least 1 risk factor for diabetes.  Breast cancer screening is essential preventive care for women. You should practice "breast self-awareness." This means understanding the normal appearance and feel of your breasts and may include breast self-examination. Any changes detected, no matter how small, should be reported to a caregiver. Women in their 81s and 30s should have a clinical breast exam (CBE) by a caregiver as part of a  regular health exam every 1 to 3 years. After age 44, women should have a CBE every year. Starting at age 53, women should consider having a mammography (breast X-ray test) every year. Women who have a family history of breast cancer should talk to their caregiver about genetic screening. Women at a high risk of breast cancer should talk to their caregivers about having magnetic resonance imaging (MRI) and a mammography every year.  The Pap test is a screening test for cervical cancer. A Pap test can show cell changes on the cervix that might become cervical cancer if left untreated. A Pap test is a procedure in which cells are obtained and examined from the lower end of the uterus (cervix).  Women should have a Pap test starting at age 76.  Between ages 30 and 62, Pap tests should be repeated every 2 years.  Beginning at age 84, you should have a Pap test every 3 years as long as the past 3 Pap tests have been normal.  Some women have medical problems that increase the chance of getting cervical cancer. Talk to your caregiver about these problems. It is especially important to talk to your caregiver if a new problem develops soon after your last Pap test. In these cases, your caregiver may recommend more frequent screening and  Pap tests.  The above recommendations are the same for women who have or have not gotten the vaccine for human papillomavirus (HPV).  If you had a hysterectomy for a problem that was not cancer or a condition that could lead to cancer, then you no longer need Pap tests. Even if you no longer need a Pap test, a regular exam is a good idea to make sure no other problems are starting.  If you are between ages 20 and 35, and you have had normal Pap tests going back 10 years, you no longer need Pap tests. Even if you no longer need a Pap test, a regular exam is a good idea to make sure no other problems are starting.  If you have had past treatment for cervical cancer or a  condition that could lead to cancer, you need Pap tests and screening for cancer for at least 20 years after your treatment.  If Pap tests have been discontinued, risk factors (such as a new sexual partner) need to be reassessed to determine if screening should be resumed.  The HPV test is an additional test that may be used for cervical cancer screening. The HPV test looks for the virus that can cause the cell changes on the cervix. The cells collected during the Pap test can be tested for HPV. The HPV test could be used to screen women aged 30 years and older, and should be used in women of any age who have unclear Pap test results. After the age of 78, women should have HPV testing at the same frequency as a Pap test.  Colorectal cancer can be detected and often prevented. Most routine colorectal cancer screening begins at the age of 18 and continues through age 72. However, your caregiver may recommend screening at an earlier age if you have risk factors for colon cancer. On a yearly basis, your caregiver may provide home test kits to check for hidden blood in the stool. Use of a small camera at the end of a tube, to directly examine the colon (sigmoidoscopy or colonoscopy), can detect the earliest forms of colorectal cancer. Talk to your caregiver about this at age 71, when routine screening begins. Direct examination of the colon should be repeated every 5 to 10 years through age 71, unless early forms of pre-cancerous polyps or small growths are found.  Hepatitis C blood testing is recommended for all people born from [birth year] through 1965 and any individual with known risks for hepatitis C.  Practice safe sex. Use condoms and avoid high-risk sexual practices to reduce the spread of sexually transmitted infections (STIs). STIs include gonorrhea, chlamydia, syphilis, trichomonas, herpes, HPV, and human immunodeficiency virus (HIV). Herpes, HIV, and HPV are viral illnesses that have no cure. They can  result in disability, cancer, and death. Sexually active women aged 80 and younger should be checked for chlamydia. Older women with new or multiple partners should also be tested for chlamydia. Testing for other STIs is recommended if you are sexually active and at increased risk.  Osteoporosis is a disease in which the bones lose minerals and strength with aging. This can result in serious bone fractures. The risk of osteoporosis can be identified using a bone density scan. Women ages 73 and over and women at risk for fractures or osteoporosis should discuss screening with their caregivers. Ask your caregiver whether you should take a calcium supplement or vitamin D to reduce the rate of osteoporosis.  Menopause can be associated  with physical symptoms and risks. Hormone replacement therapy is available to decrease symptoms and risks. You should talk to your caregiver about whether hormone replacement therapy is right for you.  Use sunscreen with sun protection factor (SPF) of 30 or more. Apply sunscreen liberally and repeatedly throughout the day. You should seek shade when your shadow is shorter than you. Protect yourself by wearing long sleeves, pants, a wide-brimmed hat, and sunglasses year round, whenever you are outdoors.  Once a month, do a whole body skin exam, using a mirror to look at the skin on your back. Notify your caregiver of new moles, moles that have irregular borders, moles that are larger than a pencil eraser, or moles that have changed in shape or color.  Stay current with required immunizations.  Influenza. You need a dose every fall (or winter). The composition of the flu vaccine changes each year, so being vaccinated once is not enough.  Pneumococcal polysaccharide. You need 1 to 2 doses if you smoke cigarettes or if you have certain chronic medical conditions. You need 1 dose at age 70 (or older) if you have never been vaccinated.  Tetanus, diphtheria, pertussis (Tdap, Td).  Get 1 dose of Tdap vaccine if you are younger than age 56, are over 48 and have contact with an infant, are a Research scientist (physical sciences), are pregnant, or simply want to be protected from whooping cough. After that, you need a Td booster dose every 10 years. Consult your caregiver if you have not had at least 3 tetanus and diphtheria-containing shots sometime in your life or have a deep or dirty wound.  HPV. You need this vaccine if you are a woman age 17 or younger. The vaccine is given in 3 doses over 6 months.  Measles, mumps, rubella (MMR). You need at least 1 dose of MMR if you were born in 1957 or later. You may also need a second dose.  Meningococcal. If you are age 38 to 45 and a first-year college student living in a residence hall, or have one of several medical conditions, you need to get vaccinated against meningococcal disease. You may also need additional booster doses.  Zoster (shingles). If you are age 57 or older, you should get this vaccine.  Varicella (chickenpox). If you have never had chickenpox or you were vaccinated but received only 1 dose, talk to your caregiver to find out if you need this vaccine.  Hepatitis A. You need this vaccine if you have a specific risk factor for hepatitis A virus infection or you simply wish to be protected from this disease. The vaccine is usually given as 2 doses, 6 to 18 months apart.  Hepatitis B. You need this vaccine if you have a specific risk factor for hepatitis B virus infection or you simply wish to be protected from this disease. The vaccine is given in 3 doses, usually over 6 months. Preventive Services / Frequency Ages 49 to 64  Blood pressure check.** / Every 1 to 2 years.  Lipid and cholesterol check.** / Every 5 years beginning at age 15.  Clinical breast exam.** / Every 3 years for women in their 44s and 30s.  Pap test.** / Every 2 years from ages 47 through 19. Every 3 years starting at age 70 through age 42 or 18 with a history  of 3 consecutive normal Pap tests.  HPV screening.** / Every 3 years from ages 62 through ages 29 to 38 with a history of 3 consecutive normal  Pap tests.  Hepatitis C blood test.** / For any individual with known risks for hepatitis C.  Skin self-exam. / Monthly.  Influenza immunization.** / Every year.  Pneumococcal polysaccharide immunization.** / 1 to 2 doses if you smoke cigarettes or if you have certain chronic medical conditions.  Tetanus, diphtheria, pertussis (Tdap, Td) immunization. / A one-time dose of Tdap vaccine. After that, you need a Td booster dose every 10 years.  HPV immunization. / 3 doses over 6 months, if you are 7 and younger.  Measles, mumps, rubella (MMR) immunization. / You need at least 1 dose of MMR if you were born in 1957 or later. You may also need a second dose.  Meningococcal immunization. / 1 dose if you are age 70 to 20 and a first-year college student living in a residence hall, or have one of several medical conditions, you need to get vaccinated against meningococcal disease. You may also need additional booster doses.  Varicella immunization.** / Consult your caregiver.  Hepatitis A immunization.** / Consult your caregiver. 2 doses, 6 to 18 months apart.  Hepatitis B immunization.** / Consult your caregiver. 3 doses usually over 6 months. Ages 50 to 81  Blood pressure check.** / Every 1 to 2 years.  Lipid and cholesterol check.** / Every 5 years beginning at age 60.  Clinical breast exam.** / Every year after age 57.  Mammogram.** / Every year beginning at age 74 and continuing for as long as you are in good health. Consult with your caregiver.  Pap test.** / Every 3 years starting at age 49 through age 83 or 30 with a history of 3 consecutive normal Pap tests.  HPV screening.** / Every 3 years from ages 51 through ages 19 to 62 with a history of 3 consecutive normal Pap tests.  Fecal occult blood test (FOBT) of stool. / Every year  beginning at age 21 and continuing until age 40. You may not need to do this test if you get a colonoscopy every 10 years.  Flexible sigmoidoscopy or colonoscopy.** / Every 5 years for a flexible sigmoidoscopy or every 10 years for a colonoscopy beginning at age 45 and continuing until age 10.  Hepatitis C blood test.** / For all people born from [birth year] through 1967 and any individual with known risks for hepatitis C.  Skin self-exam. / Monthly.  Influenza immunization.** / Every year.  Pneumococcal polysaccharide immunization.** / 1 to 2 doses if you smoke cigarettes or if you have certain chronic medical conditions.  Tetanus, diphtheria, pertussis (Tdap, Td) immunization.** / A one-time dose of Tdap vaccine. After that, you need a Td booster dose every 10 years.  Measles, mumps, rubella (MMR) immunization. / You need at least 1 dose of MMR if you were born in 1957 or later. You may also need a second dose.  Varicella immunization.** / Consult your caregiver.  Meningococcal immunization.** / Consult your caregiver.  Hepatitis A immunization.** / Consult your caregiver. 2 doses, 6 to 18 months apart.  Hepatitis B immunization.** / Consult your caregiver. 3 doses, usually over 6 months. Ages 93 and over  Blood pressure check.** / Every 1 to 2 years.  Lipid and cholesterol check.** / Every 5 years beginning at age 28.  Clinical breast exam.** / Every year after age 56.  Mammogram.** / Every year beginning at age 50 and continuing for as long as you are in good health. Consult with your caregiver.  Pap test.** / Every 3 years starting at age  30 through age 35 or 61 with a 3 consecutive normal Pap tests. Testing can be stopped between 65 and 70 with 3 consecutive normal Pap tests and no abnormal Pap or HPV tests in the past 10 years.  HPV screening.** / Every 3 years from ages 15 through ages 49 or 78 with a history of 3 consecutive normal Pap tests. Testing can be stopped between 65 and  70 with 3 consecutive normal Pap tests and no abnormal Pap or HPV tests in the past 10 years.  Fecal occult blood test (FOBT) of stool. / Every year beginning at age 30 and continuing until age 55. You may not need to do this test if you get a colonoscopy every 10 years.  Flexible sigmoidoscopy or colonoscopy.** / Every 5 years for a flexible sigmoidoscopy or every 10 years for a colonoscopy beginning at age 37 and continuing until age 63.  Hepatitis C blood test.** / For all people born from [birth year] through 1967 and any individual with known risks for hepatitis C.  Osteoporosis screening.** / A one-time screening for women ages 67 and over and women at risk for fractures or osteoporosis.  Skin self-exam. / Monthly.  Influenza immunization.** / Every year.  Pneumococcal polysaccharide immunization.** / 1 dose at age 48 (or older) if you have never been vaccinated.  Tetanus, diphtheria, pertussis (Tdap, Td) immunization. / A one-time dose of Tdap vaccine if you are over 65 and have contact with an infant, are a Research scientist (physical sciences), or simply want to be protected from whooping cough. After that, you need a Td booster dose every 10 years.  Varicella immunization.** / Consult your caregiver.  Meningococcal immunization.** / Consult your caregiver.  Hepatitis A immunization.** / Consult your caregiver. 2 doses, 6 to 18 months apart.  Hepatitis B immunization.** / Check with your caregiver. 3 doses, usually over 6 months. ** Family history and personal history of risk and conditions may change your caregiver's recommendations. Document Released: 11/07/2001 Document Revised: 12/04/2011 Document Reviewed: 02/06/2011 [facility] Patient Information 2013 [place], [place].     Neta Mends. Hafiz Irion M.D.

## 2012-11-07 ENCOUNTER — Encounter: Payer: Self-pay | Admitting: Internal Medicine

## 2012-11-07 DIAGNOSIS — Z01419 Encounter for gynecological examination (general) (routine) without abnormal findings: Secondary | ICD-10-CM | POA: Insufficient documentation

## 2012-11-07 DIAGNOSIS — Z3009 Encounter for other general counseling and advice on contraception: Secondary | ICD-10-CM | POA: Insufficient documentation

## 2012-11-07 DIAGNOSIS — D649 Anemia, unspecified: Secondary | ICD-10-CM | POA: Insufficient documentation

## 2012-11-07 DIAGNOSIS — H1010 Acute atopic conjunctivitis, unspecified eye: Secondary | ICD-10-CM | POA: Insufficient documentation

## 2012-11-07 DIAGNOSIS — Z0000 Encounter for general adult medical examination without abnormal findings: Secondary | ICD-10-CM | POA: Insufficient documentation

## 2012-11-07 DIAGNOSIS — M25561 Pain in right knee: Secondary | ICD-10-CM | POA: Insufficient documentation

## 2012-11-07 DIAGNOSIS — R109 Unspecified abdominal pain: Secondary | ICD-10-CM | POA: Insufficient documentation

## 2012-11-18 ENCOUNTER — Other Ambulatory Visit: Payer: Self-pay | Admitting: Family Medicine

## 2012-11-18 MED ORDER — METRONIDAZOLE 500 MG PO TABS: ORAL_TABLET | ORAL | Status: DC

## 2012-11-18 MED ORDER — SUMATRIPTAN SUCCINATE 100 MG PO TABS: 100.0000 mg | ORAL_TABLET | ORAL | Status: DC | PRN

## 2012-12-17 ENCOUNTER — Encounter: Payer: Self-pay | Admitting: Sports Medicine

## 2012-12-17 ENCOUNTER — Ambulatory Visit (INDEPENDENT_AMBULATORY_CARE_PROVIDER_SITE_OTHER): Payer: BC Managed Care – PPO | Admitting: Sports Medicine

## 2012-12-17 VITALS — BP 113/76 | HR 73 | Ht 62.0 in | Wt 161.0 lb

## 2012-12-17 DIAGNOSIS — M25562 Pain in left knee: Secondary | ICD-10-CM

## 2012-12-17 DIAGNOSIS — M25569 Pain in unspecified knee: Secondary | ICD-10-CM

## 2012-12-17 NOTE — Assessment & Plan Note (Signed)
Today she has LT knee sxs only Encouraged her to begin a home exercise program This will focus on keeping good hip strength as well as quadriceps strength She should be okay to run while working on this injury Use arch pads Patellar strap Recheck in 6 weeks

## 2012-12-17 NOTE — Progress Notes (Signed)
  Subjective:    Patient ID: [name], female    DOB: 1986-03-28, 27 y.o.   MRN: [id number]  HPI Patient referred courtesy of Dr Fabian Sharp  Patient is a 27 yo AA woman/runner who presents with a CC of left knee pain which started about 1 year ago following a treadmill injury. Patient describes pain as located on the anteromedial aspect of her L knee, aggravated by flexing knee and relieved by knee extension. Patient report occasional spontaneous swelling and resolution at the knee. She reports Aleve has provided some symptom relief.   Hx of rare RT knee pain but similar sxs Likes to run but the knee pain has made her hesitant to train regularly  Of note, patient reports injury to the L knee in middle school 10 years ago while running track. This sounds like PFPS at the time.  She had seen Dr Sherlean Foot 1 yr ago and he found similar findings.   Review of Systems     Objective:   Physical Exam Pleasant AA woman in NAD  Knee: Normal to inspection with no erythema or effusion or obvious bony abnormalities. Palpation normal with no warmth mild joint line tenderness and medial patellar tenderness  no condyle tenderness. ROM normal in flexion and extension and lower leg rotation. Ligaments with solid consistent endpoints including ACL, PCL, LCL, MCL. Negative Mcmurray's and provocative meniscal tests. Non painful patellar compression. Patellar and quadriceps tendons unremarkable. Hamstring and quadriceps strength is normal.  Pes planus with pronation on standing, less pronation with walking and running.  Good running gait No leg length difference.  5/5 strength on hip abduction.      Assessment & Plan:    P Use arch supports Use patella tendon strap Exercises to perform 3 set of 15 daily:  Step up and Step down with knee alignment  Lateral step-up and step down for Quads  Crossover step-up/down  Bent leg lifts  Straight leg lifts Return to clinic on 6 to 8 weeks

## 2013-01-24 ENCOUNTER — Other Ambulatory Visit: Payer: Self-pay | Admitting: Internal Medicine

## 2013-01-28 ENCOUNTER — Encounter: Payer: Self-pay | Admitting: Sports Medicine

## 2013-01-28 ENCOUNTER — Ambulatory Visit (INDEPENDENT_AMBULATORY_CARE_PROVIDER_SITE_OTHER): Payer: BC Managed Care – PPO | Admitting: Sports Medicine

## 2013-01-28 VITALS — BP 119/81 | HR 58 | Ht 62.0 in | Wt 161.0 lb

## 2013-01-28 DIAGNOSIS — M25569 Pain in unspecified knee: Secondary | ICD-10-CM

## 2013-01-28 DIAGNOSIS — M25561 Pain in right knee: Secondary | ICD-10-CM | POA: Insufficient documentation

## 2013-01-28 DIAGNOSIS — M25562 Pain in left knee: Secondary | ICD-10-CM

## 2013-01-28 NOTE — Assessment & Plan Note (Signed)
With a significant knee effusion on the right she needs to cut back on activity  She is given a home exercise program to modify  Biking  She needs a compression sleeve to keep down the swelling in the right knee  Recheck in 4-6 weeks

## 2013-01-28 NOTE — Patient Instructions (Addendum)
You have a small tear in your right medial meniscus  Try bodyhelix full knee sleeve on rt knee with walking  Biking is great exercise for your knees  Please follow up in 4-6 weeks  Thank you for seeing [place] today!

## 2013-01-28 NOTE — Assessment & Plan Note (Signed)
Her patellofemoral symptoms that were particularly worse on the left have improved significantly  Keep up home exercises and patellofemoral strap for these

## 2013-01-28 NOTE — Progress Notes (Signed)
  Subjective:    Patient ID: [name], female    DOB: [birth year], 27 y.o.   MRN: [id number]  HPI  Pt presents to clinic for f/u of bilat knee pain.   Left knee feeling much better with home exercises and bodyhelix patellar strap. Much less aching.  Rt knee pain has increased since last week.  Foot slipped while hiking- knee twisted outward. This was not hurting her at the time of her last visit  His knee has felt tight and difficult to bend since she had the fall   Review of Systems     Objective:   Physical Exam  Lt knee: Good quad strength - VMO good Abduction strength good   Rt knee Medial aspect puffier than Lt Less strength in VMO Crepitation with flexion and extension Mcmurray's neg Lauchman's neg MCL and LCL normal Abduction strength good Pain with thessaly test  MSK ultrasound Suprapatellar pouch of right knee shows significant effusion and there is none noted on the left  Quadriceps and patellar tendons are normal Meniscus on the medial aspect on the left shows some splitting and there is mild edema around the joint line Lateral meniscus is normal       Assessment & Plan:

## 2013-02-07 ENCOUNTER — Other Ambulatory Visit: Payer: Self-pay | Admitting: Family Medicine

## 2013-02-07 MED ORDER — SUMATRIPTAN SUCCINATE 100 MG PO TABS: ORAL_TABLET | ORAL | Status: DC

## 2013-02-18 ENCOUNTER — Telehealth: Payer: Self-pay | Admitting: Internal Medicine

## 2013-02-18 NOTE — Telephone Encounter (Signed)
Informed pt RX was sent in and prior auth needed for her RX SUMAtriptan (IMITREX) 100 MG tablet.

## 2013-02-18 NOTE — Telephone Encounter (Signed)
Please note that you informed the pt her rx was received and prior auth needed.

## 2013-02-18 NOTE — Telephone Encounter (Signed)
Spoke to the pharmacy.  The rx was received.  A prior authorization must be performed.  Left message on home/cell for the pt to return my call.

## 2013-02-18 NOTE — Telephone Encounter (Signed)
Patient called stating that the pharmacy states they never received refill for her sumitriptan. Please resend to [facility] on [address] in [place].

## 2013-02-27 ENCOUNTER — Ambulatory Visit: Payer: BC Managed Care – PPO | Admitting: Sports Medicine

## 2013-03-27 ENCOUNTER — Ambulatory Visit: Payer: BC Managed Care – PPO | Admitting: Sports Medicine

## 2013-07-31 ENCOUNTER — Other Ambulatory Visit: Payer: Self-pay

## 2014-03-12 ENCOUNTER — Other Ambulatory Visit: Payer: Self-pay | Admitting: Internal Medicine

## 2014-03-12 NOTE — Telephone Encounter (Signed)
Sent 1 refill.  Left a message for the pt to call the office and make an appt with Dr. Fabian SharpPanosh

## 2014-04-10 ENCOUNTER — Other Ambulatory Visit: Payer: Self-pay | Admitting: Internal Medicine

## 2014-04-14 NOTE — Telephone Encounter (Signed)
OK X 1 keep appt  In fall

## 2014-04-14 NOTE — Telephone Encounter (Signed)
Last filled in Feb 2014. Has an upcoming CPE in Nov.  Please advise.  Thanks!

## 2014-04-14 NOTE — Telephone Encounter (Signed)
I received a 2nd re-fill request on the below RX

## 2014-04-16 NOTE — Telephone Encounter (Signed)
Sent to the pharmacy by e-scribe. 

## 2014-08-11 ENCOUNTER — Other Ambulatory Visit (INDEPENDENT_AMBULATORY_CARE_PROVIDER_SITE_OTHER): Payer: 59

## 2014-08-11 DIAGNOSIS — Z0000 Encounter for general adult medical examination without abnormal findings: Secondary | ICD-10-CM

## 2014-08-11 DIAGNOSIS — Z114 Encounter for screening for human immunodeficiency virus [HIV]: Secondary | ICD-10-CM

## 2014-08-11 DIAGNOSIS — Z113 Encounter for screening for infections with a predominantly sexual mode of transmission: Secondary | ICD-10-CM

## 2014-08-11 LAB — BASIC METABOLIC PANEL
BUN: 11 mg/dL (ref 6–23)
CALCIUM: 9.1 mg/dL (ref 8.4–10.5)
CO2: 22 meq/L (ref 19–32)
Chloride: 109 mEq/L (ref 96–112)
Creatinine, Ser: 0.8 mg/dL (ref 0.4–1.2)
GFR: 84.59 mL/min (ref >60.00)
GLUCOSE: 79 mg/dL (ref 70–99)
Potassium: 4.3 mEq/L (ref 3.5–5.1)
Sodium: 140 mEq/L (ref 135–145)

## 2014-08-11 LAB — HEPATIC FUNCTION PANEL
ALBUMIN: 4.2 g/dL (ref 3.5–5.2)
ALT: 24 U/L (ref 0–35)
AST: 20 U/L (ref 0–37)
Alkaline Phosphatase: 57 U/L (ref 39–117)
Bilirubin, Direct: 0 mg/dL (ref 0.0–0.3)
TOTAL PROTEIN: 7.9 g/dL (ref 6.0–8.3)
Total Bilirubin: 0.4 mg/dL (ref 0.2–1.2)

## 2014-08-11 LAB — CBC WITH DIFFERENTIAL/PLATELET
Basophils Absolute: 0 10*3/uL (ref 0.0–0.1)
Basophils Relative: 0.8 % (ref 0.0–3.0)
EOS PCT: 2.5 % (ref 0.0–5.0)
Eosinophils Absolute: 0.1 10*3/uL (ref 0.0–0.7)
HEMATOCRIT: 32.3 % — AB (ref 36.0–46.0)
HEMOGLOBIN: 10.2 g/dL — AB (ref 12.0–15.0)
LYMPHS ABS: 1.8 10*3/uL (ref 0.7–4.0)
Lymphocytes Relative: 39.6 % (ref 12.0–46.0)
MCHC: 31.5 g/dL (ref 30.0–36.0)
MCV: 74.4 fl — ABNORMAL LOW (ref 78.0–100.0)
Monocytes Absolute: 0.4 10*3/uL (ref 0.1–1.0)
Monocytes Relative: 9.3 % (ref 3.0–12.0)
NEUTROS ABS: 2.1 10*3/uL (ref 1.4–7.7)
Neutrophils Relative %: 47.8 % (ref 43.0–77.0)
Platelets: 296 10*3/uL (ref 150.0–400.0)
RBC: 4.35 Mil/uL (ref 3.87–5.11)
RDW: 22 % — ABNORMAL HIGH (ref 11.5–15.5)
WBC: 4.5 10*3/uL (ref 4.0–10.5)

## 2014-08-11 LAB — LIPID PANEL
CHOL/HDL RATIO: 4
Cholesterol: 157 mg/dL (ref 0–200)
HDL: 35.8 mg/dL — AB (ref >39.00)
LDL Cholesterol: 86 mg/dL (ref 0–99)
NONHDL: 121.2
Triglycerides: 175 mg/dL — ABNORMAL HIGH (ref 0.0–149.0)
VLDL: 35 mg/dL (ref 0.0–40.0)

## 2014-08-11 LAB — TSH: TSH: 1.97 u[IU]/mL (ref 0.35–4.50)

## 2014-08-11 NOTE — Addendum Note (Signed)
Addended by: Rita OharaHRASHER, Sho Salguero R on: 08/11/2014 11:36 AM   Modules accepted: Orders

## 2014-08-12 LAB — HIV ANTIBODY (ROUTINE TESTING W REFLEX): HIV: NONREACTIVE

## 2014-08-12 LAB — RPR

## 2014-08-18 ENCOUNTER — Ambulatory Visit (INDEPENDENT_AMBULATORY_CARE_PROVIDER_SITE_OTHER): Payer: 59 | Admitting: Internal Medicine

## 2014-08-18 ENCOUNTER — Encounter: Payer: Self-pay | Admitting: Internal Medicine

## 2014-08-18 VITALS — BP 126/86 | Temp 98.7°F | Ht 62.0 in | Wt 192.7 lb

## 2014-08-18 DIAGNOSIS — Z0000 Encounter for general adult medical examination without abnormal findings: Secondary | ICD-10-CM

## 2014-08-18 DIAGNOSIS — G43009 Migraine without aura, not intractable, without status migrainosus: Secondary | ICD-10-CM

## 2014-08-18 DIAGNOSIS — D649 Anemia, unspecified: Secondary | ICD-10-CM

## 2014-08-18 DIAGNOSIS — Z23 Encounter for immunization: Secondary | ICD-10-CM

## 2014-08-18 DIAGNOSIS — E786 Lipoprotein deficiency: Secondary | ICD-10-CM

## 2014-08-18 MED ORDER — RIZATRIPTAN BENZOATE 10 MG PO TBDP: 10.0000 mg | ORAL_TABLET | ORAL | Status: DC | PRN

## 2014-08-18 NOTE — Patient Instructions (Signed)
Get more sleep and avoid artificial sweeteners and  Sugar drinks .   For headaches take 2 aleve and the new migraine med  For now  Fu if  persistent or progressive  Anemia FU  should get better  With time on them mirena .   Plan   Recheck cbcdiff ibc ferritin   celiac panel in 4-6 months  And fu .

## 2014-08-18 NOTE — Progress Notes (Signed)
Pre visit review using our clinic review tool, if applicable. No additional management support is needed unless otherwise documented below in the visit note.  Chief Complaint  Patient presents with  . Annual Exam    HPI: Patient  [name]  28 y.o. comes in today for Preventive Health Care visit   Has had mirena  For a year   ...  Less thanb   Periods about 5-7 days but light  Sinuses and migraines  And dry eyes  Sumatriptan not working any more   This weekend  Took 3 pills and didn't change .  Taking iron with vitamin c  Every day  No excess bleeding  allergies  flonase   Health Maintenance  Topic Date Due  . INFLUENZA VACCINE  04/26/2015  . PAP SMEAR  11/07/2015  . TETANUS/TDAP  11/06/2022   Health Maintenance Review LIFESTYLE:  Exercise:   No now  Tobacco/ETS: no Alcohol: 2 Weekly  Sugar beverages: tea  Sweet  2 x per week .  Sleep:  About 6-7  Drug use: no PAP:  Last year and normal   ROS:  GEN/ HEENT: No fever, significant weight changes sweats headaches vision problems hearing changes, CV/ PULM; No chest pain shortness of breath cough, syncope,edema  change in exercise tolerance. GI /GU: No adominal pain, vomiting, change in bowel habits. No blood in the stool. No significant GU symptoms. SKIN/HEME: ,no acute skin rashes suspicious lesions or bleeding. No lymphadenopathy, nodules, masses.  NEURO/ PSYCH:  No neurologic signs such as weakness numbness. No depression anxiety. IMM/ Allergy: No unusual infections.  Allergy .   REST of 12 system review negative except as per HPI   Past Medical History  Diagnosis Date  . Anemia, iron deficiency     With elevated hemoglobin A2  . Chicken pox   . Egg donor     Past Surgical History  Procedure Laterality Date  . Denies surgical hx      Family History  Problem Relation Age of Onset  . Hyperlipidemia Mother   . Miscarriages / [place] Mother     Mother has 1 miscarriage  . Cancer Father     Lung Cancer    . Learning disabilities Sister     Sister has Cerebral Palsy and Epilepsy  . Mental retardation Sister   . Cancer Maternal Grandfather     Liver Cancer    History   Social History  . Marital Status: Single    Spouse Name: N/A    Number of Children: N/A  . Years of Education: N/A   Social History Main Topics  . Smoking status: Never Smoker   . Smokeless tobacco: None  . Alcohol Use: None  . Drug Use: None  . Sexual Activity: None   Other Topics Concern  . 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 Tobacco   caffiene  Once coke zero.   2-3    ETOH:  Wine weekends.    Gym  4 x per week.     Working on knee  Problems  Has seen SM .  wendover  ocass aleve.           Outpatient Encounter Prescriptions as of 08/18/2014  Medication Sig  .  fexofenadine (ALLEGRA) 180 MG tablet Take 180 mg by mouth daily.  . fluticasone (FLONASE) 50 MCG/ACT nasal spray USE 2 SPRAYS IN EACH NOSTRIL EVERY DAY  . levonorgestrel (MIRENA) 20 MCG/24HR IUD 1 each by Intrauterine route once.  . SUMAtriptan (IMITREX) 100 MG tablet TAKE 1 TABLET BY MOUTH EVERY 2 HOURS AS NEEDED FOR MIGRAINE  . [DISCONTINUED] SUMAtriptan (IMITREX) 100 MG tablet Take 100 mg by mouth every 2 (two) hours as needed for migraine.  . rizatriptan (MAXALT-MLT) 10 MG disintegrating tablet Take 1 tablet (10 mg total) by mouth as needed for migraine. May repeat in 2 hours if needed  . [DISCONTINUED] loratadine (CLARITIN) 10 MG tablet Take 10 mg by mouth daily.    EXAM:  BP 126/86 mmHg  Temp(Src) 98.7 F (37.1 C) (Oral)  Ht 5\' 2"  (1.575 m)  Wt 192 lb 11.2 oz (87.408 kg)  BMI 35.24 kg/m2  LMP 07/30/2014  Body mass index is 35.24 kg/(m^2).  Physical Exam: Vital signs reviewed HQI:ONGEGEN:This is a well-developed well-nourished alert cooperative    who appearsr stated age in no acute distress.  HEENT:  normocephalic atraumatic , Eyes: PERRL EOM's full, conjunctiva clear, Nares: paten,t no deformity discharge or tenderness., Ears: no deformity EAC's clear TMs with normal landmarks. Mouth: clear OP, no lesions, edema.  Moist mucous membranes. Dentition in adequate repair. NECK: supple without masses, thyromegaly or bruits. CHEST/PULM:  Clear to auscultation and percussion breath sounds equal no wheeze , rales or rhonchi. No chest wall deformities or tenderness. Breast: normal by inspection . No dimpling, discharge, masses, tenderness or discharge . CV: PMI is nondisplaced, S1 S2 no gallops, murmurs, rubs. Peripheral pulses are full without delay.No JVD .  ABDOMEN: Bowel sounds normal nontender  No guard or rebound, no hepato splenomegal no CVA tenderness.  No hernia. Extremtities:  No clubbing cyanosis or edema, no acute joint swelling or redness no focal atrophy NEURO:  Oriented x3, cranial nerves 3-12 appear to be intact, no obvious focal weakness,gait within normal limits no abnormal reflexes or asymmetrical SKIN: No acute rashes normal turgor, color, no bruising or petechiae. PSYCH: Oriented, good eye contact, no obvious depression anxiety, cognition and judgment appear normal. LN: no cervical axillary inguinal adenopathy  Lab Results  Component Value Date   WBC 4.5 08/11/2014   HGB 10.2* 08/11/2014   HCT 32.3* 08/11/2014   PLT 296.0 08/11/2014   GLUCOSE 79 08/11/2014   CHOL 157 08/11/2014   TRIG 175.0* 08/11/2014   HDL 35.80* 08/11/2014   LDLCALC 86 08/11/2014   ALT 24 08/11/2014   AST 20 08/11/2014   NA 140 08/11/2014   K 4.3 08/11/2014   CL 109 08/11/2014   CREATININE 0.8 08/11/2014   BUN 11 08/11/2014   CO2 22 08/11/2014   TSH 1.97 08/11/2014    ASSESSMENT AND PLAN:  Discussed the following assessment and plan:  Visit for preventive health examination  Anemia, unspecified anemia type - may be mixed iron  and or b thal?   Nonintractable migraine, unspecified migraine  type - change to maxalt track and add nsaid   Low HDL (under 40)  Need for prophylactic vaccination and inoculation against influenza - Plan: Flu Vaccine QUAD 36+ mos PF IM (Fluarix Quad PF) Counseled regarding healthy nutrition, exercise, sleep, injury prevention, calcium vit d and healthy weight .  Patient Care Team: Madelin HeadingsWanda K Jolonda Gomm, MD as PCP - General (Internal Medicine) Patient Instructions  Get more sleep and avoid artificial sweeteners and  Sugar drinks .  For headaches take 2 aleve and the new migraine med  For now  Fu if  persistent or progressive  Anemia FU  should get better  With time on them mirena .   Plan   Recheck cbcdiff ibc ferritin   celiac panel in 4-6 months  And fu .      Neta MendsWanda K. Ayde Record M.D.

## 2014-09-30 ENCOUNTER — Other Ambulatory Visit: Payer: Self-pay | Admitting: Internal Medicine

## 2014-09-30 NOTE — Telephone Encounter (Signed)
Sent to the pharmacy by e-scribe. 

## 2014-12-02 ENCOUNTER — Telehealth: Payer: Self-pay | Admitting: Internal Medicine

## 2014-12-02 MED ORDER — MOMETASONE FUROATE 50 MCG/ACT NA SUSP: 2.0000 | Freq: Every day | NASAL | Status: DC

## 2014-12-02 NOTE — Telephone Encounter (Signed)
Pt staes the fluticasone (FLONASE) 50 MCG/ACT nasal spray is just not working. Would like at alternate. Has tried OTC, no relief. The sinus issues are triggering her migraines, which dr Fabian Sharppanosh has her on med for. But if there is another spray she could try, she would appreciate.

## 2014-12-02 NOTE — Telephone Encounter (Signed)
Contact patient  If having nasal congestion or allergy triggered headaches   Can try  nasonex 2 sprays each nostril  each day  (Or omnaris which may not be on formulary )  Refill x 3  Check for coupons on liine

## 2014-12-02 NOTE — Telephone Encounter (Signed)
Spoke to the pt.  She is having burning in her nasal passages along with sinus pain/pressure and nasal congestion.  Informed the pt that I will send in Nasonex.

## 2014-12-03 ENCOUNTER — Telehealth: Payer: Self-pay | Admitting: Internal Medicine

## 2014-12-03 NOTE — Telephone Encounter (Signed)
Nasonex is a Plan Exclusion.  The preferred medications on patient's plan are as follows: fluticasone, zetonna, and flunisolide.

## 2014-12-09 NOTE — Telephone Encounter (Signed)
Pt has tried and failed fluticasone.  Nasonex is not covered.  WP out of the office.  Will send to Dr. Clent RidgesFry to see if he can authorize medication change.

## 2014-12-10 NOTE — Telephone Encounter (Signed)
This will need to wait for Dr. Fabian SharpPanosh to address

## 2014-12-14 NOTE — Telephone Encounter (Signed)
Tell patient what  is going on . She can try zetonna .  Find out  How many failures  She needs to cover other meds

## 2014-12-15 NOTE — Telephone Encounter (Signed)
LM for a return call.  

## 2014-12-21 MED ORDER — CICLESONIDE 37 MCG/ACT NA AERS: 1.0000 | INHALATION_SPRAY | Freq: Every day | NASAL | Status: DC

## 2014-12-21 NOTE — Telephone Encounter (Signed)
Pt notified and Zetonna sent to the pharmacy by e-scribe.

## 2014-12-28 ENCOUNTER — Telehealth: Payer: Self-pay | Admitting: Internal Medicine

## 2014-12-28 NOTE — Telephone Encounter (Signed)
Sure  .  Looks for co pay card.

## 2014-12-28 NOTE — Telephone Encounter (Signed)
Left message for the pt to return my call. 

## 2014-12-28 NOTE — Telephone Encounter (Signed)
Spoke to the pharmacist.  [name] is [phone].  Okay to send in flunisolide?

## 2014-12-28 NOTE — Telephone Encounter (Signed)
Spoke to the pt.  [name] is $214.  Medication needs to be changed.  [facility]

## 2014-12-28 NOTE — Telephone Encounter (Signed)
Pt called to say that she did not pick up the following med Mometasone   NASONEX . Pt said the nasonex was over 200.00   Pt is asking if there is something else you could try for her   phamacy [facility] [address] [place]

## 2014-12-29 MED ORDER — FLUNISOLIDE 25 MCG/ACT (0.025%) NA SOLN: 2.0000 | Freq: Two times a day (BID) | NASAL | Status: DC

## 2014-12-29 NOTE — Addendum Note (Signed)
Addended by: Raj JanusADKINS, Tevis Dunavan T on: 12/29/2014 09:26 AM   Modules accepted: Orders, Medications

## 2014-12-29 NOTE — Telephone Encounter (Signed)
Pt notified to pick up at the pharmacy. 

## 2015-02-09 ENCOUNTER — Other Ambulatory Visit (INDEPENDENT_AMBULATORY_CARE_PROVIDER_SITE_OTHER): Payer: 59

## 2015-02-09 DIAGNOSIS — D509 Iron deficiency anemia, unspecified: Secondary | ICD-10-CM

## 2015-02-09 LAB — CBC WITH DIFFERENTIAL/PLATELET
BASOS ABS: 0 10*3/uL (ref 0.0–0.1)
Basophils Relative: 0.7 % (ref 0.0–3.0)
Eosinophils Absolute: 0.1 10*3/uL (ref 0.0–0.7)
Eosinophils Relative: 2 % (ref 0.0–5.0)
HEMATOCRIT: 33.2 % — AB (ref 36.0–46.0)
HEMOGLOBIN: 10.8 g/dL — AB (ref 12.0–15.0)
Lymphocytes Relative: 40.7 % (ref 12.0–46.0)
Lymphs Abs: 1.9 10*3/uL (ref 0.7–4.0)
MCHC: 32.5 g/dL (ref 30.0–36.0)
MCV: 76.3 fl — AB (ref 78.0–100.0)
MONO ABS: 0.4 10*3/uL (ref 0.1–1.0)
Monocytes Relative: 8.8 % (ref 3.0–12.0)
NEUTROS ABS: 2.2 10*3/uL (ref 1.4–7.7)
NEUTROS PCT: 47.8 % (ref 43.0–77.0)
PLATELETS: 294 10*3/uL (ref 150.0–400.0)
RBC: 4.35 Mil/uL (ref 3.87–5.11)
RDW: 15.8 % — ABNORMAL HIGH (ref 11.5–15.5)
WBC: 4.6 10*3/uL (ref 4.0–10.5)

## 2015-02-09 LAB — IBC PANEL
IRON: 42 ug/dL (ref 42–145)
SATURATION RATIOS: 10.7 % — AB (ref 20.0–50.0)
Transferrin: 280 mg/dL (ref 212.0–360.0)

## 2015-02-09 LAB — FERRITIN: FERRITIN: 6.5 ng/mL — AB (ref 10.0–291.0)

## 2015-02-10 LAB — GLIA (IGA/G) + TTG IGA
GLIADIN IGG: 2 U (ref <20)
Gliadin IgA: 2 Units (ref <20)
TISSUE TRANSGLUTAMINASE AB, IGA: 1 U/mL (ref <4)

## 2015-02-16 ENCOUNTER — Ambulatory Visit: Payer: 59 | Admitting: Internal Medicine

## 2015-02-26 ENCOUNTER — Ambulatory Visit (INDEPENDENT_AMBULATORY_CARE_PROVIDER_SITE_OTHER): Payer: 59 | Admitting: Internal Medicine

## 2015-02-26 ENCOUNTER — Encounter: Payer: Self-pay | Admitting: Internal Medicine

## 2015-02-26 VITALS — BP 116/78 | Temp 98.2°F | Ht 62.0 in | Wt 191.5 lb

## 2015-02-26 DIAGNOSIS — R0989 Other specified symptoms and signs involving the circulatory and respiratory systems: Secondary | ICD-10-CM | POA: Diagnosis not present

## 2015-02-26 DIAGNOSIS — G43809 Other migraine, not intractable, without status migrainosus: Secondary | ICD-10-CM | POA: Diagnosis not present

## 2015-02-26 DIAGNOSIS — D509 Iron deficiency anemia, unspecified: Secondary | ICD-10-CM

## 2015-02-26 NOTE — Patient Instructions (Signed)
Continue the iron supplement and can try increase to 3 per day  especially during .  Still have some iron deficiency .   Continue nasal cortisone  For sinus issues  If r progressive  Contact  us consider ent check   Plan wellness check in  About 6 months  And if ok then yearly . Continue headache prevention parameters .

## 2015-02-26 NOTE — Progress Notes (Signed)
Pre visit review using our clinic review tool, if applicable. No additional management support is needed unless otherwise documented below in the visit note.  Chief Complaint  Patient presents with  . Follow-up    anemia headaches     HPI: [name] 29 y.o. comes in for fu anemia  Ha sinus issues  Taking  2 at night with vit C   Ok at night .   Headaches:   Contribute  to sinu prssures left face and then ha and then moving and then eye .  Taking allegra  And benadryl  nasalide with some help nasal Stuffy  3- 4 days per week   Every day helps some.   Bad headaches  This year  2  2-3 days  Took med x 2 . imitrex  On mirena for over a year periods about 5-7 days light and heavy no bleeding gi gu otherwise no bruising  ROS: See pertinent positives and negatives per HPI. No cv pulm sx otherwise   Past Medical History  Diagnosis Date  . Anemia, iron deficiency     With elevated hemoglobin A2  . Chicken pox   . Egg donor     Family History  Problem Relation Age of Onset  . Hyperlipidemia Mother   . Miscarriages / [place] Mother     Mother has 1 miscarriage  . Cancer Father     Lung Cancer  . Learning disabilities Sister     Sister has Cerebral Palsy and Epilepsy  . Mental retardation Sister   . Cancer Maternal Grandfather     Liver Cancer    History   Social History  . Marital Status: Single    Spouse Name: N/A  . Number of Children: N/A  . Years of Education: N/A   Social History Main Topics  . Smoking status: Never Smoker   . Smokeless tobacco: Not on file  . Alcohol Use: Not on file  . Drug Use: Not on file  . Sexual Activity: Not on file   Other Topics Concern  . 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 Tobacco   caffiene  Once coke zero.   2-3    ETOH:  Wine weekends.    Gym  4 x per week.     Working on knee   Problems  Has seen SM .  wendover  ocass aleve.           Outpatient Prescriptions Prior to Visit  Medication Sig Dispense Refill  . fexofenadine (ALLEGRA) 180 MG tablet Take 180 mg by mouth daily.    . flunisolide (NASALIDE) 25 MCG/ACT (0.025%) SOLN Place 2 sprays into the nose 2 (two) times daily. 1 Bottle 3  . levonorgestrel (MIRENA) 20 MCG/24HR IUD 1 each by Intrauterine route once.    . SUMAtriptan (IMITREX) 100 MG tablet TAKE 1 TABLET BY MOUTH EVERY 2 HOURS AS NEEDED FOR MIGRAINE 9 tablet 0  . rizatriptan (MAXALT-MLT) 10 MG disintegrating tablet Take 1 tablet (10 mg total) by mouth as needed for migraine. May repeat in 2 hours if needed 10 tablet 0   No facility-administered medications prior to visit.     EXAM:  BP 116/78 mmHg  Temp(Src) 98.2 F (36.8  C) (Oral)  Ht 5\' 2"  (1.575 m)  Wt 191 lb 8 oz (86.864 kg)  BMI 35.02 kg/m2  Body mass index is 35.02 kg/(m^2).  GENERAL: vitals reviewed and listed above, alert, oriented, appears well hydrated and in no acute distress HEENT: atraumatic, conjunctiva  clear, no obvious abnormalities on inspection of external nose and ears Oface non tender NECK: no obvious masses on inspection palpation  LUNGS: clear to auscultation bilaterally, no wheezes, rales or rhonchi, good air movement CV: HRRR, no clubbing cyanosis or  peripheral edema nl cap refill  Abdomen:  Sof,t normal bowel sounds without hepatosplenomegaly, no guarding rebound or masses no CVA tenderness MS: moves all extremities without noticeable focal  abnormality PSYCH: pleasant and cooperative, no obvious depression or anxiety Lab Results  Component Value Date   WBC 4.6 02/09/2015   HGB 10.8* 02/09/2015   HCT 33.2* 02/09/2015   PLT 294.0 02/09/2015   GLUCOSE 79 08/11/2014   CHOL 157 08/11/2014   TRIG 175.0* 08/11/2014   HDL 35.80* 08/11/2014   LDLCALC 86 08/11/2014   ALT 24 08/11/2014   AST 20 08/11/2014   NA 140 08/11/2014   K 4.3 08/11/2014   CL 109 08/11/2014     CREATININE 0.8 08/11/2014   BUN 11 08/11/2014   CO2 22 08/11/2014   TSH 1.97 08/11/2014   [name] c 10 normal  Ferritin and ivc low  ASSESSMENT AND PLAN:  Discussed the following assessment and plan:  Anemia, iron deficiency - prob from menses no obv other cause nl hg fr her may be high 11 range increase iron attention etc   Other migraine without status migrainosus, not intractable - ocass controlled   Chronic sinus complaints - left  cont regimen fu if worse consider seeing ent ifneeded cpx 6 months with ferritin and hiv aby ( routine screen) -Patient advised to return or notify health care team  if symptoms worsen ,persist or new concerns arise.  Patient Instructions  Continue the iron supplement and can try increase to 3 per day  especially during .  Still have some iron deficiency .   Continue nasal cortisone  For sinus issues  If r progressive  Contact  us consider ent check   Plan wellness check in  About 6 months  And if ok then yearly . Continue headache prevention parameters .      Neta MendsWanda K. Lucine Bilski M.D.

## 2015-04-12 ENCOUNTER — Telehealth: Payer: Self-pay | Admitting: Internal Medicine

## 2015-04-12 NOTE — Telephone Encounter (Signed)
Patient Name: Cathy CheadleLISA Allen  DOB: 05/14/1986    Initial Comment Caller States she had bad gas over the weekend. tried OTC meds but didnt work well, can still feels pain in her abdomen    Nurse Assessment  Nurse: Scarlette ArStandifer, RN, Herbert SetaHeather Date/Time (Eastern Time): 04/12/2015 11:26:42 AM  Confirm and document reason for call. If symptomatic, describe symptoms. ---Caller States she had bad gas over the weekend. tried OTC meds, it went away but then came back the next day, so she took more meds and it got a little better but did not go away, can still feel pain in her abdomen  Has the patient traveled out of the country within the last 30 days? ---Not Applicable  Does the patient require triage? ---Yes  Related visit to physician within the last 2 weeks? ---No  Does the PT have any chronic conditions? (i.e. diabetes, asthma, etc.) ---No  Did the patient indicate they were pregnant? ---No     Guidelines    Guideline Title Affirmed Question Affirmed Notes  Abdominal Pain - Female [1] MILD (e.g., does not interfere with normal activities) AND [2] pain comes and goes (cramps) AND [3] present > 48 hours    Final Disposition User   See Physician within 24 Hours Standifer, RN, Research scientist (physical sciences)Heather    Comments  Appt made with Dr. Caryl NeverBurchette tomorrow morning at 9:45 am.

## 2015-04-12 NOTE — Telephone Encounter (Signed)
noted 

## 2015-04-13 ENCOUNTER — Ambulatory Visit (INDEPENDENT_AMBULATORY_CARE_PROVIDER_SITE_OTHER): Payer: 59 | Admitting: Family Medicine

## 2015-04-13 ENCOUNTER — Encounter: Payer: Self-pay | Admitting: Family Medicine

## 2015-04-13 VITALS — BP 120/78 | HR 82 | Temp 98.5°F | Wt 191.0 lb

## 2015-04-13 DIAGNOSIS — R143 Flatulence: Secondary | ICD-10-CM

## 2015-04-13 DIAGNOSIS — K5900 Constipation, unspecified: Secondary | ICD-10-CM | POA: Diagnosis not present

## 2015-04-13 NOTE — Progress Notes (Signed)
Pre visit review using our clinic review tool, if applicable. No additional management support is needed unless otherwise documented below in the visit note. 

## 2015-04-13 NOTE — Patient Instructions (Signed)

## 2015-04-13 NOTE — Progress Notes (Signed)
   Subjective:    Patient ID: [name] Smoker, female    DOB: 08/05/1986, 29 y.o.   MRN: [id number]  HPI  Patient is seen with onset Saturday of increased gas symptoms. She does have some constipation intermittently. She noticed some bilateral abdominal cramping. She took Gas-X which helped. Pepto-Bismol did not. She's not had any recent stool changes. She has history of chronic iron deficiency anemia and is on iron replacement. She denies any nausea or vomiting. No recent appetite or weight changes.  Past Medical History  Diagnosis Date  . Anemia, iron deficiency     With elevated hemoglobin A2  . Chicken pox   . Egg donor    Past Surgical History  Procedure Laterality Date  . Denies surgical hx      reports that she has never smoked. She does not have any smokeless tobacco history on file. Her alcohol and drug histories are not on file. family history includes Cancer in her father and maternal grandfather; Hyperlipidemia in her mother; Learning disabilities in her sister; Mental retardation in her sister; Miscarriages / Stillbirths in her mother. No Known Allergies   Review of Systems  Constitutional: Negative for fever and chills.  Respiratory: Negative for shortness of breath.   Cardiovascular: Negative for chest pain.  Gastrointestinal: Positive for abdominal pain and constipation. Negative for nausea, vomiting, diarrhea and blood in stool.  Genitourinary: Negative for dysuria.       Objective:   Physical Exam  Constitutional: She appears well-developed and well-nourished.  HENT:  Mouth/Throat: Oropharynx is clear and moist.  Neck: Neck supple.  Cardiovascular: Normal rate and regular rhythm.   Pulmonary/Chest: Effort normal and breath sounds normal. No respiratory distress. She has no wheezes. She has no rales.  Abdominal: Soft. Bowel sounds are normal. She exhibits no distension and no mass. There is no tenderness. There is no rebound and no guarding.            Assessment & Plan:  Diffuse abdominal pains and increased gas with frequent constipation symptoms. Handout on constipation given. Increase fluid intake. Avoid carbonated beverages. Adequate fiber. Consider occasional MiraLAX for severe constipation. Follow-up for any fever, localizing abdominal pain or any new symptoms

## 2015-04-23 ENCOUNTER — Other Ambulatory Visit: Payer: Self-pay | Admitting: Internal Medicine

## 2015-04-25 NOTE — Telephone Encounter (Signed)
Ok to refill x 3  Disp 9 Sent in .

## 2015-04-26 ENCOUNTER — Telehealth: Payer: Self-pay | Admitting: Internal Medicine

## 2015-04-26 HISTORY — PX: OTHER SURGICAL HISTORY: SHX169

## 2015-04-26 NOTE — Telephone Encounter (Signed)
Patient Name: [name]  DOB: 1986/08/14    Initial Comment caller states she has abdominal cramping   Nurse Assessment  Nurse: Deatra James RN, Corrie Dandy Date/Time [name] Time): 04/26/2015 9:31:13 AM  Confirm and document reason for call. If symptomatic, describe symptoms. ---Patient states she is having abdominal cramping. She was seen on the 19th and they gave her a pamphlet about foods not to eat because she was having gas. Now she is having abdominal cramping all the time  Has the patient traveled out of the country within the last 30 days? ---No  Does the patient require triage? ---Yes  Related visit to physician within the last 2 weeks? ---Yes  Does the PT have any chronic conditions? (i.e. diabetes, asthma, etc.) ---Yes  List chronic conditions. ---"anemia"  Did the patient indicate they were pregnant? ---No     Guidelines    Guideline Title Affirmed Question Affirmed Notes  Abdominal Pain - Female [1] MODERATE (e.g., interferes with normal activities) AND [2] pain comes and goes (cramps) AND [3] present > 24 hours (Exception: pain with Vomiting or Diarrhea - see that Guideline)    Final Disposition User   See Physician within 24 Hours Noe, RN, [facility]    Referrals  REFERRED TO PCP OFFICE   Disagree/Comply: Comply

## 2015-04-26 NOTE — Telephone Encounter (Signed)
Appointment scheduled for tomorrow with MD

## 2015-04-27 ENCOUNTER — Ambulatory Visit (INDEPENDENT_AMBULATORY_CARE_PROVIDER_SITE_OTHER): Payer: 59 | Admitting: Internal Medicine

## 2015-04-27 ENCOUNTER — Ambulatory Visit (INDEPENDENT_AMBULATORY_CARE_PROVIDER_SITE_OTHER)
Admission: RE | Admit: 2015-04-27 | Discharge: 2015-04-27 | Disposition: A | Discharge status: Home / Self Care | Payer: 59 | Source: Ambulatory Visit | Attending: Internal Medicine | Admitting: Internal Medicine

## 2015-04-27 ENCOUNTER — Encounter: Payer: Self-pay | Admitting: Internal Medicine

## 2015-04-27 VITALS — BP 120/76 | Temp 98.5°F | Wt 188.4 lb

## 2015-04-27 DIAGNOSIS — R109 Unspecified abdominal pain: Secondary | ICD-10-CM

## 2015-04-27 DIAGNOSIS — K5900 Constipation, unspecified: Secondary | ICD-10-CM

## 2015-04-27 DIAGNOSIS — D509 Iron deficiency anemia, unspecified: Secondary | ICD-10-CM

## 2015-04-27 LAB — CBC WITH DIFFERENTIAL/PLATELET
Basophils Absolute: 0 10*3/uL (ref 0.0–0.1)
Basophils Relative: 0.6 % (ref 0.0–3.0)
Eosinophils Absolute: 0.1 10*3/uL (ref 0.0–0.7)
Eosinophils Relative: 1.8 % (ref 0.0–5.0)
HEMATOCRIT: 30.2 % — AB (ref 36.0–46.0)
Hemoglobin: 9.7 g/dL — ABNORMAL LOW (ref 12.0–15.0)
LYMPHS PCT: 22.8 % (ref 12.0–46.0)
Lymphs Abs: 1.5 10*3/uL (ref 0.7–4.0)
MCHC: 32.1 g/dL (ref 30.0–36.0)
MCV: 76.1 fl — ABNORMAL LOW (ref 78.0–100.0)
Monocytes Absolute: 0.5 10*3/uL (ref 0.1–1.0)
Monocytes Relative: 8.3 % (ref 3.0–12.0)
NEUTROS ABS: 4.4 10*3/uL (ref 1.4–7.7)
Neutrophils Relative %: 66.5 % (ref 43.0–77.0)
Platelets: 399 10*3/uL (ref 150.0–400.0)
RBC: 3.97 Mil/uL (ref 3.87–5.11)
RDW: 15.8 % — ABNORMAL HIGH (ref 11.5–15.5)
WBC: 6.6 10*3/uL (ref 4.0–10.5)

## 2015-04-27 LAB — COMPREHENSIVE METABOLIC PANEL
ALBUMIN: 4.1 g/dL (ref 3.5–5.2)
ALT: 43 U/L — AB (ref 0–35)
AST: 22 U/L (ref 0–37)
Alkaline Phosphatase: 133 U/L — ABNORMAL HIGH (ref 39–117)
BUN: 8 mg/dL (ref 6–23)
CALCIUM: 9.5 mg/dL (ref 8.4–10.5)
CO2: 30 mEq/L (ref 19–32)
CREATININE: 0.74 mg/dL (ref 0.40–1.20)
Chloride: 101 mEq/L (ref 96–112)
GFR: 97.45 mL/min (ref >60.00)
Glucose, Bld: 98 mg/dL (ref 70–99)
Potassium: 4.6 mEq/L (ref 3.5–5.1)
SODIUM: 136 meq/L (ref 135–145)
Total Bilirubin: 0.2 mg/dL (ref 0.2–1.2)
Total Protein: 8.6 g/dL — ABNORMAL HIGH (ref 6.0–8.3)

## 2015-04-27 LAB — POCT URINALYSIS DIP (MANUAL ENTRY)
BILIRUBIN UA: NEGATIVE
Bilirubin, UA: NEGATIVE
GLUCOSE UA: NEGATIVE
Leukocytes, UA: NEGATIVE
NITRITE UA: NEGATIVE
PH UA: 6
RBC UA: NEGATIVE
SPEC GRAV UA: 1.02
Urobilinogen, UA: 0.2

## 2015-04-27 LAB — TSH: TSH: 2.04 u[IU]/mL (ref 0.35–4.50)

## 2015-04-27 LAB — T4, FREE: FREE T4: 0.84 ng/dL (ref 0.60–1.60)

## 2015-04-27 LAB — POCT URINE PREGNANCY: Preg Test, Ur: NEGATIVE

## 2015-04-27 LAB — SEDIMENTATION RATE: Sed Rate: 117 mm/hr — ABNORMAL HIGH (ref 0–22)

## 2015-04-27 NOTE — Patient Instructions (Addendum)
Symptoms sound like constipation but I agree it seems to be more severe than usual. We'll recheck blood count urine test. Consider plain x-ray of abdomen or ultrasound. If okay consider taking a MiraLAX   To help .   Miralax 2 capfuls twice a day for 5- 7 days  .

## 2015-04-27 NOTE — Progress Notes (Signed)
Pre visit review using our clinic review tool, if applicable. No additional management support is needed unless otherwise documented below in the visit note.  Chief Complaint  Patient presents with  . Gas  . Constipation  . Abdominal Pain    HPI: Patient [name]  comes in today for SDA for  new problem evaluation. [facility] by [facility] but actually was seen by Dr. Caryl Never a couple weeks ago forGas pain and constipation and  Some better but after eating sharp sapsms   After eating  Hurts to cough urinate  Etc.  Not related to iron  Supplements . No  Earlier constipation. In abdomena nd loser stomach.   Painful  Stools gas and extreme  Pain with defecation.  No aspirin excess NSAIDs alcohol vomiting right upper quadrant pain vaginal discharge or UTI symptoms. She states that she's had a stool 3 days ago and yesterday fairly normal texture but has a lot of gas after going. No blood was noted. Her abdominal cramping are mostly after eating or during eating. Periods are okay and this does not seem related to her cycle. ROS: See pertinent positives and negatives per HPI.  Past Medical History  Diagnosis Date  . Anemia, iron deficiency     With elevated hemoglobin A2  . Chicken pox   . Egg donor     Family History  Problem Relation Age of Onset  . Hyperlipidemia Mother   . Miscarriages / [place] Mother     Mother has 1 miscarriage  . Cancer Father     Lung Cancer  . Learning disabilities Sister     Sister has Cerebral Palsy and Epilepsy  . Mental retardation Sister   . Cancer Maternal Grandfather     Liver Cancer    History   Social History  . Marital Status: Single    Spouse Name: N/A  . Number of Children: N/A  . Years of Education: N/A   Social History Main Topics  . Smoking status: Never Smoker   . Smokeless tobacco: Not on file  . Alcohol Use: Not on file  . Drug Use: Not on file  . Sexual Activity: Not on file   Other Topics Concern  . None    Social History Narrative   hhof 2 and 2  plus dog   Working  Animator.   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  6 hours   40 work  And 12 credits .Marland Kitchen Done dec 16    No ets. Tobacco   caffiene  Once coke zero.   2-3    ETOH:  Wine weekends.    Gym  4 x per week.     Working on knee  Problems  Has seen SM .  wendover  ocass aleve.           Outpatient Prescriptions Prior to Visit  Medication Sig Dispense Refill  . fexofenadine (ALLEGRA) 180 MG tablet Take 180 mg by mouth daily.    . flunisolide (NASALIDE) 25 MCG/ACT (0.025%) SOLN Place 2 sprays into the nose 2 (two) times daily. 1 Bottle 3  . levonorgestrel (MIRENA) 20 MCG/24HR IUD 1 each by Intrauterine route once.    . SUMAtriptan (IMITREX) 100 MG tablet 1 po at onset  Of migraine.Can repeat in 2 hours if needed 9 tablet 2   No facility-administered medications prior to visit.     EXAM:  BP 120/76 mmHg  Temp(Src) 98.5 F (36.9 C) (Oral)  Wt 188 lb 6.4 oz (85.458 kg)  LMP 04/20/2015  Body mass index is 34.45 kg/(m^2).  GENERAL: vitals reviewed and listed above, alert, oriented, appears well hydrated and in no acute distress HEENT: atraumatic, conjunctiva  clear, no obvious abnormalities on inspection of external nose and earsNECK: no obvious masses on inspection palpation  CV: HRRR, no clubbing cyanosis or  peripheral edema nl cap refill  Abdomen:  Sof,t normal bowel sounds without hepatosplenomegaly, no guarding rebound or masses no CVA tenderness points to mid to lower gutters abdomen no obvious hernia no focal tenderness Rectal neg fime nl stool brown heme negative no masses no severe pain. No obvious fissures MS: moves all extremities without noticeable focal  abnormality PSYCH: pleasant and cooperative, no obvious depression or anxiety Lab Results  Component Value Date   WBC 4.6 02/09/2015   HGB 10.8* 02/09/2015   HCT 33.2* 02/09/2015   PLT 294.0 02/09/2015   GLUCOSE 79 08/11/2014   CHOL 157 08/11/2014   TRIG 175.0*  08/11/2014   HDL 35.80* 08/11/2014   LDLCALC 86 08/11/2014   ALT 24 08/11/2014   AST 20 08/11/2014   NA 140 08/11/2014   K 4.3 08/11/2014   CL 109 08/11/2014   CREATININE 0.8 08/11/2014   BUN 11 08/11/2014   CO2 22 08/11/2014   TSH 1.97 08/11/2014    ASSESSMENT AND PLAN:  Discussed the following assessment and plan:  Abdominal pain, unspecified abdominal location - Plan: CBC with Differential/Platelet, POCT urinalysis dipstick, Sedimentation rate, Comprehensive metabolic panel, TSH, T4, free, POCT urine pregnancy, DG Abd 2 Views  Constipation, unspecified constipation type - Plan: CBC with Differential/Platelet, POCT urinalysis dipstick, Sedimentation rate, Comprehensive metabolic panel, TSH, T4, free, POCT urine pregnancy, DG Abd 2 Views  Anemia, iron deficiency - Plan: CBC with Differential/Platelet, POCT urinalysis dipstick, Sedimentation rate, Comprehensive metabolic panel, TSH, T4, free, POCT urine pregnancy, DG Abd 2 Views Acts more like constipation but should be getting better instead of worse no other alarm findings. Lab test and regular abdominal x-ray plan MiraLAX no alarming symptoms or signs of obstruction. If persistent progressive consider ultrasound more evaluation GI etc. -Patient advised to return or notify health care team  if symptoms worsen ,persist or new concerns arise.  Patient Instructions  Symptoms sound like constipation but I agree it seems to be more severe than usual. We'll recheck blood count urine test. Consider plain x-ray of abdomen or ultrasound. If okay consider taking a MiraLAX   To help .   Miralax 2 capfuls twice a day for 5- 7 days  .    Cathy Mends. Sharlot Allen M.D.

## 2015-04-28 ENCOUNTER — Telehealth: Payer: Self-pay | Admitting: Internal Medicine

## 2015-04-28 NOTE — Telephone Encounter (Signed)
[facility] - Client TELEPHONE ADVICE RECORD [facility] Patient Name: [name] DOB: [birth year] Initial Comment Caller states, she saw the Dr yesterday, was waiting on lab work to come back, But symptoms are worse now. Today she has a fever, body chills, nauseated Nurse Assessment Nurse: Ladona Ridgel, RN, Felicia Date/Time (Eastern Time): 04/28/2015 3:01:13 PM Confirm and document reason for call. If symptomatic, describe symptoms. ---PT went to MD yesterday for 2nd visit for extreme gas and stomach pain and she had Xray, UA and rectal exam and labs. She was told to call back for more symptoms. Now she has fever, [place]. She was constipated and MD said to take miralax. Small BM today. Last previous BM was Mon. that was a good one. No thermometer. Estimates moderate fever. Still having stomach pain toward L him level 4 now.Has the patient traveled out of the country within the last 30 days? ---No Does the patient require triage? ---Yes Related visit to physician within the last 2 weeks? ---Yes Does the PT have any chronic conditions? (i.e. diabetes, asthma, etc.) ---No Did the patient indicate they were pregnant? ---No Guidelines Guideline Title Affirmed Question Affirmed Notes Abdominal Pain - Female [1] MODERATE (e.g., interferes with normal activities) AND [2] pain comes and goes (cramps) AND [3] present > 24 hours (Exception: pain with Vomiting or Diarrhea - see that Guideline) Final Disposition User See Physician within 24 Hours Gaddy, RN, FeliciaComments PT only wants to see Dr. Fabian Sharp who doesnt have opennings tom. Alcario Drought was reached at backline. She checked to see if Panosh could squeeze her in. She took a message of symptoms pt is having and will pass it onto MD and someone will call pt back. Disagree/Comply: Comply  Call Id: [phone]

## 2015-04-28 NOTE — Telephone Encounter (Signed)
Can you please follow up on this by end of day

## 2015-04-28 NOTE — Telephone Encounter (Signed)
[name] from [facility] called regarding this patient. Patient saw Dr. Fabian Allen on 04/27/2015. Patients has a moderate fever with nausea no vomiting  that started on today, left lower abdominal pain level 4 that is on and off. Her last bowel movement on was 04/26/2015 and had a small bowel movement today. Patient only wants to Dr. Fabian Allen per triage nurse patients need to be seen in 24 hours.

## 2015-04-28 NOTE — Telephone Encounter (Signed)
Left a message for a return call.

## 2015-04-28 NOTE — Telephone Encounter (Signed)
Noted.  See triage note.

## 2015-04-28 NOTE — Telephone Encounter (Signed)
Advise  If significant  Fever and  pain go to ED  For evaluation  Otherwise   Plan ROV with me  Friday   (  Other provider if available tomorrow )  Lab showed her anemia normal white blood count but increase inflammation markers .

## 2015-04-29 NOTE — Telephone Encounter (Signed)
Spoke to the pt.  Advised if her fever was high and having significant pain than should go to the ED.  She stated her fever was 102 yesterday afternoon but today is around 100.  Feeling better today.  She agreed to go to ED if fever spiked.  She is unsure if this is related to her abdominal pain.

## 2015-04-30 ENCOUNTER — Ambulatory Visit (INDEPENDENT_AMBULATORY_CARE_PROVIDER_SITE_OTHER): Payer: 59 | Admitting: Internal Medicine

## 2015-04-30 ENCOUNTER — Encounter: Payer: Self-pay | Admitting: Internal Medicine

## 2015-04-30 VITALS — BP 118/74 | HR 105 | Temp 99.1°F | Ht 62.0 in | Wt 188.0 lb

## 2015-04-30 DIAGNOSIS — R700 Elevated erythrocyte sedimentation rate: Secondary | ICD-10-CM | POA: Diagnosis not present

## 2015-04-30 DIAGNOSIS — K5900 Constipation, unspecified: Secondary | ICD-10-CM

## 2015-04-30 DIAGNOSIS — R5081 Fever presenting with conditions classified elsewhere: Secondary | ICD-10-CM | POA: Diagnosis not present

## 2015-04-30 DIAGNOSIS — R109 Unspecified abdominal pain: Secondary | ICD-10-CM

## 2015-04-30 LAB — CBC WITH DIFFERENTIAL/PLATELET
BASOS PCT: 0 % (ref 0–1)
Basophils Absolute: 0 10*3/uL (ref 0.0–0.1)
EOS ABS: 0.1 10*3/uL (ref 0.0–0.7)
Eosinophils Relative: 1 % (ref 0–5)
HEMATOCRIT: 28.8 % — AB (ref 36.0–46.0)
HEMOGLOBIN: 9.3 g/dL — AB (ref 12.0–15.0)
Lymphocytes Relative: 15 % (ref 12–46)
Lymphs Abs: 1.8 10*3/uL (ref 0.7–4.0)
MCH: 23.8 pg — ABNORMAL LOW (ref 26.0–34.0)
MCHC: 32.3 g/dL (ref 30.0–36.0)
MCV: 73.8 fL — ABNORMAL LOW (ref 78.0–100.0)
MONO ABS: 0.6 10*3/uL (ref 0.1–1.0)
MPV: 9.5 fL (ref 8.6–12.4)
Monocytes Relative: 5 % (ref 3–12)
Neutro Abs: 9.3 10*3/uL — ABNORMAL HIGH (ref 1.7–7.7)
Neutrophils Relative %: 79 % — ABNORMAL HIGH (ref 43–77)
Platelets: 442 10*3/uL — ABNORMAL HIGH (ref 150–400)
RBC: 3.9 MIL/uL (ref 3.87–5.11)
RDW: 16.1 % — AB (ref 11.5–15.5)
WBC: 11.8 10*3/uL — ABNORMAL HIGH (ref 4.0–10.5)

## 2015-04-30 LAB — POCT URINALYSIS DIPSTICK
Bilirubin, UA: NEGATIVE
Glucose, UA: NEGATIVE
Ketones, UA: NEGATIVE
LEUKOCYTES UA: NEGATIVE
Nitrite, UA: NEGATIVE
Protein, UA: NEGATIVE
RBC UA: NEGATIVE
SPEC GRAV UA: 1.015
UROBILINOGEN UA: 2
pH, UA: 8.5

## 2015-04-30 MED ORDER — DOXYCYCLINE HYCLATE 100 MG PO CAPS: 100.0000 mg | ORAL_CAPSULE | Freq: Two times a day (BID) | ORAL | Status: DC

## 2015-04-30 NOTE — Patient Instructions (Signed)
Uncertain cause of your fever although I'm glad you're getting better. We'll repeat her liver tests and urine screens and blood count. If fevers persistent would add empiric antibody doxycycline for the reasons discussed such as summer flu tick related disease etc.  Consideration for abdominal imaging such as an ultrasound CT scan if persistent progressive.  Plan fu visit in  2 weeks or as needed.  Continue on the miralax.

## 2015-04-30 NOTE — Progress Notes (Signed)
Chief Complaint  Patient presents with  . Follow-up    HPI: Patient Spruha Weight  comes in today for fu   problem evaluation. See phone notes. In regard to abdominal pain Small  bms using capful  Bid Wednesday so daily so far. Last bm yesterday. Nl and soft . Not as much hurting. Feels that her abdominal pain is getting better when she has discomfort is epigastric and no lower. No vomiting no blood. However had Onset fever and chills 2 days ago .  Up to 102  Nausea  .    Told cold /fever reducer every 4 hours  For fever had  Chills still fever .  slept all day.  Today a bit better 99 temperature and so went to work .   Pain is higher  Lower abd pain is better  No cough no ha  Now at onset.  No tick bites pos mosquito .     ROS: See pertinent positives and negatives per HPI. No unusual rashes joint pains severe headaches of the mild headache other people's with fever no sore throat cough sneeze. No UTI symptoms no pelvic pain or vaginal discharge. One partner 1-1/2 years.  Past Medical History  Diagnosis Date  . Anemia, iron deficiency     With elevated hemoglobin A2  . Chicken pox   . Egg donor     Family History  Problem Relation Age of Onset  . Hyperlipidemia Mother   . Miscarriages / [place] Mother     Mother has 1 miscarriage  . Cancer Father     Lung Cancer  . Learning disabilities Sister     Sister has Cerebral Palsy and Epilepsy  . Mental retardation Sister   . Cancer Maternal Grandfather     Liver Cancer    History   Social History  . Marital Status: Single    Spouse Name: N/A  . Number of Children: N/A  . Years of Education: N/A   Social History Main Topics  . Smoking status: Never Smoker   . Smokeless tobacco: Never Used  . Alcohol Use: No  . Drug Use: No  . Sexual Activity: Not on file   Other Topics Concern  . None   Social History Narrative   hhof 2 and 2  plus dog   Working  Dispensing optician.   School [employer]  Senior  [employer] .  6 hours   40 work   And 12 credits .Marland Kitchen Done dec 16    No ets. Tobacco   caffiene  Once coke zero.   2-3    ETOH:  Wine weekends.    Gym  4 x per week.     Working on knee  Problems  Has seen SM .  wendover  ocass aleve.           Outpatient Prescriptions Prior to Visit  Medication Sig Dispense Refill  . fexofenadine (ALLEGRA) 180 MG tablet Take 180 mg by mouth daily.    . flunisolide (NASALIDE) 25 MCG/ACT (0.025%) SOLN Place 2 sprays into the nose 2 (two) times daily. 1 Bottle 3  . levonorgestrel (MIRENA) 20 MCG/24HR IUD 1 each by Intrauterine route once.    . SUMAtriptan (IMITREX) 100 MG tablet 1 po at onset  Of migraine.Can repeat in 2 hours if needed 9 tablet 2   No facility-administered medications prior to visit.     EXAM:  BP 118/74 mmHg  Pulse 105  Temp(Src) 99.1 F (37.3  C)  Ht 5' 2" (1.575 m)  Wt 188 lb (85.276 kg)  BMI 34.38 kg/m2  LMP 04/20/2015  Body mass index is 34.38 kg/(m^2).  GENERAL: vitals reviewed and listed above, alert, oriented, appears well hydrated and in no acute distress HEENT: atraumatic, conjunctiva  clear, no obvious abnormalities on inspection of external nose and ears OP : no lesion edema or exudate  NECK: no obvious masses on inspection palpation thyroids palpable with no adenopathy LUNGS: clear to auscultation bilaterally, no wheezes, rales or rhonchi, good air movement CV: HRRR, no clubbing cyanosis or  peripheral edema nl cap refill  Abdomen bowel sounds present soft no focal tenderness  No  psoas signs MS: moves all extremities without noticeable focal  Abnormality Skin: normal capillary refill ,turgor , color: No acute rashes ,petechiae or bruising.Marland Kitchen PSYCH: pleasant and cooperative, no obvious depression or anxiety Wt Readings from Last 3 Encounters:  04/30/15 188 lb (85.276 kg)  04/27/15 188 lb 6.4 oz (85.458 kg)  04/13/15 191 lb (86.637 kg)   BP Readings from Last 3 Encounters:  04/30/15 118/74  04/27/15 120/76  04/13/15 120/78   Lab Results    Component Value Date   WBC 6.6 04/27/2015   HGB 9.7* 04/27/2015   HCT 30.2* 04/27/2015   PLT 399.0 04/27/2015   GLUCOSE 98 04/27/2015   CHOL 157 08/11/2014   TRIG 175.0* 08/11/2014   HDL 35.80* 08/11/2014   LDLCALC 86 08/11/2014   ALT 43* 04/27/2015   AST 22 04/27/2015   NA 136 04/27/2015   K 4.6 04/27/2015   CL 101 04/27/2015   CREATININE 0.74 04/27/2015   BUN 8 04/27/2015   CO2 30 04/27/2015   TSH 2.04 04/27/2015   Protein in urine   ASSESSMENT AND PLAN:  Discussed the following assessment and plan:  Fever presenting with conditions classified elsewhere - Plan: POCT urinalysis dipstick, GC/chlamydia probe amp, urine, Protein / creatinine ratio, urine, Hepatitis Acute Panel, CBC with Differential/Platelet, Hepatic function panel, Urine Microscopic, CANCELED: CBC with Differential/Platelet, CANCELED: Hepatic function panel, CANCELED: Urinalysis, microscopic only, CANCELED: C-reactive protein  Abdominal pain, unspecified abdominal location - Plan: POCT urinalysis dipstick, GC/chlamydia probe amp, urine, Protein / creatinine ratio, urine, Hepatitis Acute Panel, CBC with Differential/Platelet, High sensitivity CRP, Urine Microscopic, CANCELED: CBC with Differential/Platelet, CANCELED: Hepatic function panel, CANCELED: Urinalysis, microscopic only, CANCELED: C-reactive protein  Constipation, unspecified constipation type - Plan: POCT urinalysis dipstick, GC/chlamydia probe amp, urine, Protein / creatinine ratio, urine, Hepatitis Acute Panel, CANCELED: CBC with Differential/Platelet, CANCELED: Hepatic function panel, CANCELED: Urinalysis, microscopic only, CANCELED: C-reactive protein  ESR raised - Plan: POCT urinalysis dipstick, GC/chlamydia probe amp, urine, Protein / creatinine ratio, urine, Hepatitis Acute Panel, High sensitivity CRP, CANCELED: CBC with Differential/Platelet, CANCELED: Hepatic function panel, CANCELED: Urinalysis, microscopic only, CANCELED: C-reactive protein she  actually looks much better than previous. And her abdominal pain is getting better treating for constipation. Unknown cause of her fever although could be in the interim disease process minor liver abnormalities and proteinuria. Discussed summer flu and sometimes we use empiric doxycycline. A little concerned because of her very high sedimentation rate however she looks quite well. If this is ongoing she may need imaging study of her abdomen. Ultrasound versus CT. Plan RV in 2-3 weeks or next week if not getting better. I will be out of the office but will discuss this with other provider. -Patient advised to return or notify health care team  if symptoms worsen ,persist or new concerns arise.  Patient Instructions  Uncertain cause of your fever although I'm glad you're getting better. We'll repeat her liver tests and urine screens and blood count. If fevers persistent would add empiric antibody doxycycline for the reasons discussed such as summer flu tick related disease etc.  Consideration for abdominal imaging such as an ultrasound CT scan if persistent progressive.  Plan fu visit in  2 weeks or as needed.  Continue on the miralax.      Standley Brooking. Panosh M.D.

## 2015-04-30 NOTE — Telephone Encounter (Signed)
appt scheduled for 8.5.2016 with Dr. Fabian Sharp.

## 2015-05-01 LAB — HEPATIC FUNCTION PANEL
ALBUMIN: 3.9 g/dL (ref 3.6–5.1)
ALT: 27 U/L (ref 6–29)
AST: 14 U/L (ref 10–30)
Alkaline Phosphatase: 137 U/L — ABNORMAL HIGH (ref 33–115)
BILIRUBIN TOTAL: 0.2 mg/dL (ref 0.2–1.2)
Bilirubin, Direct: 0.1 mg/dL (ref <=0.2)
Indirect Bilirubin: 0.1 mg/dL — ABNORMAL LOW (ref 0.2–1.2)
Total Protein: 8.2 g/dL — ABNORMAL HIGH (ref 6.1–8.1)

## 2015-05-01 LAB — URINALYSIS, MICROSCOPIC ONLY
Bacteria, UA: NONE SEEN [HPF]
CRYSTALS: NONE SEEN [HPF]
Casts: NONE SEEN [LPF]
RBC / HPF: NONE SEEN RBC/HPF (ref <=2)
WBC UA: NONE SEEN WBC/HPF (ref <=5)
YEAST: NONE SEEN [HPF]

## 2015-05-01 LAB — HIGH SENSITIVITY CRP: CRP, High Sensitivity: 112 mg/L — ABNORMAL HIGH

## 2015-05-01 LAB — HEPATITIS PANEL, ACUTE
HCV Ab: NEGATIVE
HEP B S AG: NEGATIVE
Hep A IgM: NONREACTIVE
Hep B C IgM: NONREACTIVE

## 2015-05-01 LAB — GC/CHLAMYDIA PROBE AMP, URINE
Chlamydia, Swab/Urine, PCR: NEGATIVE
GC Probe Amp, Urine: NEGATIVE

## 2015-05-01 LAB — PROTEIN / CREATININE RATIO, URINE
Creatinine, Urine: 140.7 mg/dL
Protein Creatinine Ratio: 0.11 (ref <0.15)
Total Protein, Urine: 15 mg/dL (ref 5–24)

## 2015-05-13 ENCOUNTER — Inpatient Hospital Stay (HOSPITAL_COMMUNITY)
Admission: AD | Admit: 2015-05-13 | Discharge: 2015-05-21 | DRG: 759 | Disposition: A | Discharge status: Home / Self Care | Payer: 59 | Source: Ambulatory Visit | Attending: Obstetrics & Gynecology | Admitting: Obstetrics & Gynecology

## 2015-05-13 ENCOUNTER — Telehealth: Payer: Self-pay

## 2015-05-13 ENCOUNTER — Encounter (HOSPITAL_COMMUNITY): Payer: Self-pay | Admitting: *Deleted

## 2015-05-13 ENCOUNTER — Ambulatory Visit (INDEPENDENT_AMBULATORY_CARE_PROVIDER_SITE_OTHER)
Admission: RE | Admit: 2015-05-13 | Discharge: 2015-05-13 | Disposition: A | Discharge status: Home / Self Care | Payer: 59 | Source: Ambulatory Visit | Attending: Internal Medicine | Admitting: Internal Medicine

## 2015-05-13 ENCOUNTER — Encounter: Payer: Self-pay | Admitting: Internal Medicine

## 2015-05-13 ENCOUNTER — Ambulatory Visit (INDEPENDENT_AMBULATORY_CARE_PROVIDER_SITE_OTHER): Payer: 59 | Admitting: Internal Medicine

## 2015-05-13 VITALS — BP 116/74 | Temp 98.6°F | Wt 187.3 lb

## 2015-05-13 DIAGNOSIS — Z975 Presence of (intrauterine) contraceptive device: Secondary | ICD-10-CM | POA: Diagnosis not present

## 2015-05-13 DIAGNOSIS — R938 Abnormal findings on diagnostic imaging of other specified body structures: Secondary | ICD-10-CM

## 2015-05-13 DIAGNOSIS — M545 Low back pain, unspecified: Secondary | ICD-10-CM

## 2015-05-13 DIAGNOSIS — R109 Unspecified abdominal pain: Secondary | ICD-10-CM

## 2015-05-13 DIAGNOSIS — R5081 Fever presenting with conditions classified elsewhere: Secondary | ICD-10-CM | POA: Diagnosis not present

## 2015-05-13 DIAGNOSIS — Z01411 Encounter for gynecological examination (general) (routine) with abnormal findings: Secondary | ICD-10-CM

## 2015-05-13 DIAGNOSIS — N838 Other noninflammatory disorders of ovary, fallopian tube and broad ligament: Secondary | ICD-10-CM

## 2015-05-13 DIAGNOSIS — N7093 Salpingitis and oophoritis, unspecified: Secondary | ICD-10-CM | POA: Diagnosis present

## 2015-05-13 DIAGNOSIS — B9561 Methicillin susceptible Staphylococcus aureus infection as the cause of diseases classified elsewhere: Secondary | ICD-10-CM | POA: Diagnosis present

## 2015-05-13 DIAGNOSIS — R700 Elevated erythrocyte sedimentation rate: Secondary | ICD-10-CM

## 2015-05-13 LAB — CBC
HEMATOCRIT: 26.3 % — AB (ref 36.0–46.0)
HEMOGLOBIN: 8.4 g/dL — AB (ref 12.0–15.0)
MCH: 23 pg — AB (ref 26.0–34.0)
MCHC: 31.9 g/dL (ref 30.0–36.0)
MCV: 72.1 fL — AB (ref 78.0–100.0)
PLATELETS: 482 10*3/uL — AB (ref 150–400)
RBC: 3.65 MIL/uL — AB (ref 3.87–5.11)
RDW: 16 % — ABNORMAL HIGH (ref 11.5–15.5)
WBC: 8.8 10*3/uL (ref 4.0–10.5)

## 2015-05-13 LAB — COMPREHENSIVE METABOLIC PANEL
ALK PHOS: 82 U/L (ref 38–126)
ALT: 13 U/L — AB (ref 14–54)
AST: 16 U/L (ref 15–41)
Albumin: 3.3 g/dL — ABNORMAL LOW (ref 3.5–5.0)
Anion gap: 9 (ref 5–15)
CALCIUM: 8.8 mg/dL — AB (ref 8.9–10.3)
CHLORIDE: 99 mmol/L — AB (ref 101–111)
CO2: 26 mmol/L (ref 22–32)
CREATININE: 0.74 mg/dL (ref 0.44–1.00)
Glucose, Bld: 103 mg/dL — ABNORMAL HIGH (ref 65–99)
Potassium: 3.4 mmol/L — ABNORMAL LOW (ref 3.5–5.1)
Sodium: 134 mmol/L — ABNORMAL LOW (ref 135–145)
Total Bilirubin: 0.5 mg/dL (ref 0.3–1.2)
Total Protein: 8.4 g/dL — ABNORMAL HIGH (ref 6.5–8.1)

## 2015-05-13 MED ORDER — AMPICILLIN SODIUM 2 G IJ SOLR
2.0000 g | Freq: Four times a day (QID) | INTRAMUSCULAR | Status: DC
  Administered 2015-05-14 – 2015-05-19 (×22): 2 g via INTRAVENOUS
  Filled 2015-05-13 (×25): qty 2000

## 2015-05-13 MED ORDER — CLINDAMYCIN PHOSPHATE 900 MG/50ML IV SOLN
900.0000 mg | Freq: Three times a day (TID) | INTRAVENOUS | Status: DC
  Administered 2015-05-14 – 2015-05-19 (×18): 900 mg via INTRAVENOUS
  Filled 2015-05-13 (×19): qty 50

## 2015-05-13 MED ORDER — HYDROMORPHONE HCL 1 MG/ML IJ SOLN
1.0000 mg | INTRAMUSCULAR | Status: DC | PRN
  Administered 2015-05-17 – 2015-05-18 (×4): 1 mg via INTRAVENOUS
  Administered 2015-05-18: 2 mg via INTRAVENOUS
  Administered 2015-05-18 (×3): 1 mg via INTRAVENOUS
  Administered 2015-05-19: 2 mg via INTRAVENOUS
  Administered 2015-05-19 – 2015-05-21 (×2): 1 mg via INTRAVENOUS
  Filled 2015-05-13: qty 2
  Filled 2015-05-13: qty 1
  Filled 2015-05-13: qty 2
  Filled 2015-05-13 (×6): qty 1
  Filled 2015-05-13 (×2): qty 2
  Filled 2015-05-13: qty 1

## 2015-05-13 MED ORDER — IOHEXOL 300 MG/ML  SOLN
100.0000 mL | Freq: Once | INTRAMUSCULAR | Status: AC | PRN
  Administered 2015-05-13: 100 mL via INTRAVENOUS

## 2015-05-13 MED ORDER — ALUM & MAG HYDROXIDE-SIMETH 200-200-20 MG/5ML PO SUSP
30.0000 mL | ORAL | Status: DC | PRN
  Administered 2015-05-13: 30 mL via ORAL
  Filled 2015-05-13: qty 30

## 2015-05-13 MED ORDER — ACETAMINOPHEN 325 MG PO TABS
650.0000 mg | ORAL_TABLET | ORAL | Status: DC | PRN
  Administered 2015-05-13 – 2015-05-20 (×3): 650 mg via ORAL
  Filled 2015-05-13 (×3): qty 2

## 2015-05-13 MED ORDER — SODIUM CHLORIDE 0.9 % IV SOLN
INTRAVENOUS | Status: DC
  Administered 2015-05-13 – 2015-05-14 (×2): via INTRAVENOUS
  Administered 2015-05-14: 125 mL/h via INTRAVENOUS
  Administered 2015-05-15 – 2015-05-17 (×3): via INTRAVENOUS

## 2015-05-13 MED ORDER — PRENATAL MULTIVITAMIN CH
1.0000 | ORAL_TABLET | Freq: Every day | ORAL | Status: DC
  Administered 2015-05-14 – 2015-05-20 (×5): 1 via ORAL
  Filled 2015-05-13 (×5): qty 1

## 2015-05-13 MED ORDER — ZOLPIDEM TARTRATE 5 MG PO TABS: 5.0000 mg | ORAL_TABLET | Freq: Every evening | ORAL | Status: DC | PRN

## 2015-05-13 MED ORDER — POLYETHYLENE GLYCOL 3350 17 G PO PACK
17.0000 g | PACK | Freq: Every day | ORAL | Status: DC | PRN
  Administered 2015-05-20: 17 g via ORAL
  Filled 2015-05-13: qty 1

## 2015-05-13 MED ORDER — ONDANSETRON HCL 4 MG PO TABS: 4.0000 mg | ORAL_TABLET | Freq: Four times a day (QID) | ORAL | Status: DC | PRN

## 2015-05-13 MED ORDER — ONDANSETRON HCL 4 MG/2ML IJ SOLN
4.0000 mg | Freq: Four times a day (QID) | INTRAMUSCULAR | Status: DC | PRN
  Administered 2015-05-20 (×2): 4 mg via INTRAVENOUS
  Filled 2015-05-13 (×2): qty 2

## 2015-05-13 MED ORDER — OXYCODONE-ACETAMINOPHEN 5-325 MG PO TABS: 1.0000 | ORAL_TABLET | ORAL | Status: DC | PRN

## 2015-05-13 NOTE — MAU Note (Signed)
PT  SAYS SHE WENT  TODAY    TO SEE DR PANOSH  WITH  [place]  -  FOR   LOWER  LEFT  ABD  PAIN - THAT  STARTED  4  WEEKS AGO,   THEN 2 WEEKS  AGO  SHE  DEVELOPED  A FEVER .    DR PANOSH  SENT  HER  FOR  CT SCAN  ON [address]-   WHICH  SHOWED     ABCESS AND  TOLD  HER  TO COME  TO ER  FOR  ADMISSION  WITH   DR  LEGGETT-   AND  WOULD  HAVE  TO REMOVE HER  IUD.            HAD XRAYS  DONE   ON 8-2 AT [place] -  WHICH SHOWED-  NL.     DID MORE  LABS ON 8-5-  WHICH  SHOWED INFLAMMED   .

## 2015-05-13 NOTE — Patient Instructions (Addendum)
Plan CTscan of abdomen   Pelvis  Will let you know results  Infectious disease consult  Or gyne depending on results . Marland Kitchen

## 2015-05-13 NOTE — H&P (Signed)
[name] is an 29 y.o. female who presents from  with a TuboOvarian Abscess.  She has been followed by Dr Fabian Sharp since early August for abdominal pain and constipation. She also had an unexplained fever. She was still having pain and fever this week and so a CT scan was done. This showed a large tuboovarian abscess. It spanned both sides and displaced her uterus. She has an IUD in place.   She has been working, but is increasingly uncomfortable with pain.  Pain is in her lower abdomen and she even has pain in her ischial bones when sitting. Pain is crampy.   Pertinent Gynecological History: Menses: minimal due to IUD Bleeding: not bleeding Contraception: IUD DES exposure: unknown Blood transfusions: none Sexually transmitted diseases: no past history Previous GYN Procedures: none  Last mammogram: none Date:   OB History: G0, P0   Menstrual History:  Patient's last menstrual period was 04/20/2015.    Past Medical History  Diagnosis Date  . Anemia, iron deficiency     With elevated hemoglobin A2  . Chicken pox   . Egg donor     Past Surgical History  Procedure Laterality Date  . Denies surgical hx      Family History  Problem Relation Age of Onset  . Hyperlipidemia Mother   . Miscarriages / [place] Mother     Mother has 1 miscarriage  . Cancer Father     Lung Cancer  . Learning disabilities Sister     Sister has Cerebral Palsy and Epilepsy  . Mental retardation Sister   . Cancer Maternal Grandfather     Liver Cancer    Social History:  reports that she has never smoked. She has never used smokeless tobacco. She reports that she does not drink alcohol or use illicit drugs.  Allergies: No Known Allergies  Prescriptions prior to admission  Medication Sig Dispense Refill Last Dose  . doxycycline (VIBRAMYCIN) 100 MG capsule Take 1 capsule (100 mg total) by mouth 2 (two) times daily. 20 capsule 0 Taking  . fexofenadine (ALLEGRA) 180 MG tablet Take 180 mg  by mouth daily.   Taking  . flunisolide (NASALIDE) 25 MCG/ACT (0.025%) SOLN Place 2 sprays into the nose 2 (two) times daily. 1 Bottle 3 Taking  . levonorgestrel (MIRENA) 20 MCG/24HR IUD 1 each by Intrauterine route once.   Taking  . SUMAtriptan (IMITREX) 100 MG tablet 1 po at onset  Of migraine.Can repeat in 2 hours if needed 9 tablet 2 Taking    Review of Systems  Constitutional: Positive for fever and malaise/fatigue. Negative for chills.  Respiratory: Negative for shortness of breath.   Cardiovascular: Negative for chest pain.  Gastrointestinal: Positive for abdominal pain and constipation. Negative for nausea, vomiting and diarrhea.  Genitourinary: Negative for dysuria.  Musculoskeletal: Negative for myalgias and neck pain.  Neurological: Negative for dizziness, weakness and headaches.    Blood pressure 119/71, pulse 111, temperature 102.2 F (39 C), temperature source Oral, resp. rate 20, height  (1.575 m), weight 187 lb 8 oz (85.049 kg), last menstrual period 04/20/2015. Physical Exam  Constitutional: She is oriented to person, place, and time. She appears well-developed and well-nourished. No distress.  HENT:  Head: Normocephalic.  Cardiovascular: Normal rate and regular rhythm.   Respiratory: Effort normal. No respiratory distress.  GI: Soft.  Genitourinary: Vaginal discharge (minimal') found.  Cervix long and closed with IUD string visible Cervix displaced to patient's right THere is a large mass adjacent to cervix, large  orange size Unable to palpate uterus secondary to mass Unable to palpate adnexa secondary to mass and discomfort  Musculoskeletal: Normal range of motion.  Neurological: She is alert and oriented to person, place, and time.  Skin: Skin is warm and dry. She is not diaphoretic.  Psychiatric: She has a normal mood and affect.    No results found for this or any previous visit (from the past 24 hour(s)).  Ct Abdomen Pelvis W Contrast  05/13/2015    CLINICAL DATA:  Fever for 10 days. Abnormal pelvic exam. Pelvic mass.  EXAM: CT ABDOMEN AND PELVIS WITH CONTRAST  TECHNIQUE: Multidetector CT imaging of the abdomen and pelvis was performed using the standard protocol following bolus administration of intravenous contrast.  CONTRAST:  OMNIPAQUE IOHEXOL 300 MG/ML  SOLN  COMPARISON:  04/27/2015  FINDINGS: Musculoskeletal:  No aggressive osseous lesions.  Lung Bases: Clear.  Liver:  Normal.  Spleen:  Normal.  Gallbladder:  Normal.  Common bile duct:  Normal.  Pancreas:  Normal.  Adrenal glands:  Normal.  Kidneys: Normal enhancement. LEFT ureter appears normal. RIGHT ureter appears normal.  Stomach:  Normal.  Small bowel:  Normal.  No obstruction or mesenteric adenopathy.  Colon: Normal retrocecal appendix identified. Ascending colon, transverse, descending and sigmoid colon are within normal limits. There is fat stranding extending to the rectum from pelvic mass.  Pelvic Genitourinary: The urinary bladder is partially collapsed. IUD present within the uterus. There is a complex cluster of cystic lesions with peripheral enhancement in the anatomic pelvis on both sides of the uterus, compatible with tubo-ovarian abscess. Surrounding inflammatory changes are present. In aggregate, the pelvic mass measures 9 cm transverse, 6.8 cm craniocaudal and at least 7.3 cm AP.  Peritoneum: No free air.  Vascular/lymphatic: No acute vascular abnormality. Small retroperitoneal lymph nodes are likely reactive to pelvic process.  Body Wall: Normal.  IMPRESSION: 1. Complex multilocular inflammatory mass in the anatomic pelvis compatible with tubo-ovarian abscess. 2. IUD present in the uterus which is displaced inferiorly by the mass. 3. These results will be called to the ordering clinician or representative by the Radiologist Assistant, and communication documented in the PACS or zVision Dashboard.   Electronically Signed   By: Andreas Newport M.D.   On: 05/13/2015 16:50     Assessment/Plan: A:  TuboOvarian Abscess  P:  Admit to [facility] Unit per Dr Penne Lash       Routine GYN orders       IV hydration       IV antibiotics, to be decided by Dr Penne Lash       Analgesia ordered       Plan [place] in am to evaluate abscess         [facility] 05/13/2015, 9:26 PM

## 2015-05-13 NOTE — Telephone Encounter (Signed)
Stacy from [facility] called with a call report regarding pt CT scan that was done today. Please see ASAP.

## 2015-05-13 NOTE — Progress Notes (Signed)
Pre visit review using our clinic review tool, if applicable. No additional management support is needed unless otherwise documented below in the visit note.  Chief Complaint  Patient presents with  . Follow-up    fever abd pain constipation    HPI: [name]  29 y.o. fu of abd sx and then fever  abnornal  labs and elevated esr    since last visit she states that her stomach pain has resolved when she takes MiraLAX feels good if she goes off the MiraLAX her stomach pain comes back. Bowel movements are soft occasionally green no blood.  Fever : Every day   Almost  Tylenol bid  Once a day  Then  Fever  Between 99 and 100  Usually at night  Then 101 chills and body aches .  Occasionally.  Began  Doxy 4 days ago .    Onset august 12 .   Still having fever. . Pattern as above. No unusual rashes joint swelling cough respiratory symptoms. She is still gone to work but just felt tired. No unusual headaches.  Stomach pain  Is good  With miralax    .  Pain if off  Normal between and soft   ocass green .   No blood .  No pelvic pain  .  No abd vag dc.   One sore on tongue.  No joint swelling.   New pain a week ago  .   Feels like has  "Butt bone pain. " feels sore ischial area  When sitting  No radiating pain or pain with bm  No uti sx or urinary problems  ROS: See pertinent positives and negatives per HPI. No adenopathy see above  Past Medical History  Diagnosis Date  . Anemia, iron deficiency     With elevated hemoglobin A2  . Chicken pox   . Egg donor     Family History  Problem Relation Age of Onset  . Hyperlipidemia Mother   . Miscarriages / [place] Mother     Mother has 1 miscarriage  . Cancer Father     Lung Cancer  . Learning disabilities Sister     Sister has Cerebral Palsy and Epilepsy  . Mental retardation Sister   . Cancer Maternal Grandfather     Liver Cancer    Social History   Social History  . Marital Status: Single    Spouse Name: N/A  . Number of  Children: N/A  . Years of Education: N/A   Social History Main Topics  . Smoking status: Never Smoker   . Smokeless tobacco: Never Used  . Alcohol Use: No  . Drug Use: No  . Sexual Activity: Not Asked   Other Topics Concern  . None   Social History Narrative   hhof 2 and 2  plus dog   Working  Dispensing optician.   School [employer]  Sen[employer] .  6 hours   40 work  And 12 credits .Marland Kitchen Done dec 16    No ets. Tobacco   caffiene  Once coke zero.   2-3    ETOH:  Wine weekends.    Gym  4 x per week.     Working on knee  Problems  Has seen SM .  wendover  ocass aleve.           Outpatient Prescriptions Prior to Visit  Medication Sig Dispense Refill  . doxycycline (VIBRAMYCIN) 100 MG capsule Take 1 capsule (100  mg total) by mouth 2 (two) times daily. 20 capsule 0  . fexofenadine (ALLEGRA) 180 MG tablet Take 180 mg by mouth daily.    . flunisolide (NASALIDE) 25 MCG/ACT (0.025%) SOLN Place 2 sprays into the nose 2 (two) times daily. 1 Bottle 3  . levonorgestrel (MIRENA) 20 MCG/24HR IUD 1 each by Intrauterine route once.    . SUMAtriptan (IMITREX) 100 MG tablet 1 po at onset  Of migraine.Can repeat in 2 hours if needed 9 tablet 2   No facility-administered medications prior to visit.     EXAM:  BP 116/74 mmHg  Temp(Src) 98.6 F (37 C) (Oral)  Wt 187 lb 4.8 oz (84.959 kg)  LMP 04/20/2015  Body mass index is 34.25 kg/(m^2).  GENERAL: vitals reviewed and listed above, alert, oriented, appears well hydrated and in no acute distress HEENT: atraumatic, conjunctiva  clear, no obvious abnormalities on inspection of external nose and ears OP : no lesion edema or exudate  NECK: no obvious masses on inspection palpation  LUNGS: clear to auscultation bilaterally, no wheezes, rales or rhonchi, good air movement CV: HRRR, no clubbing cyanosis or  peripheral edema nl cap refill  Abdomen:  Sof,t normal bowel sounds without hepatosplenomegaly, no guarding rebound or masses no CVA tenderness MS: moves  all extremities without noticeable focal  abnormality PSYCH: pleasant and cooperative, no obvious depression or anxiety LN: no sig cervical axillary inguinal adenopathy Skin: normal capillary refill ,turgor , color: No acute rashes ,petechiae or bruising tatoos Back sacrum ok no discrete point tenderness . Pelvic: NL ext  White dc GU, labia clear without lesions or rash . Vagina no lesions white normal dc .Cervix: clear  i ud in place   UTERUS: Neg CMT Adnexa:  clear  But thre is a firm rough feeling  mass feeling inferiro  Lower left  Mild tender?   Rectal no mass  .    Lab Results  Component Value Date   WBC 11.8* 04/30/2015   HGB 9.3* 04/30/2015   HCT 28.8* 04/30/2015   PLT 442* 04/30/2015   GLUCOSE 98 04/27/2015   CHOL 157 08/11/2014   TRIG 175.0* 08/11/2014   HDL 35.80* 08/11/2014   LDLCALC 86 08/11/2014   ALT 27 04/30/2015   AST 14 04/30/2015   NA 136 04/27/2015   K 4.6 04/27/2015   CL 101 04/27/2015   CREATININE 0.74 04/27/2015   BUN 8 04/27/2015   CO2 30 04/27/2015   TSH 2.04 04/27/2015    ASSESSMENT AND PLAN:  Discussed the following assessment and plan:  Fever presenting with conditions classified elsewhere - persisting constipation sx better on miralax  . - Plan: CT Abdomen Pelvis W Contrast  Abnormal pelvic exam  -  mass left lower  - Plan: CT Abdomen Pelvis W Contrast  Midline low back pain without sciatica - ischial pain   newer  non focal   - Plan: CT Abdomen Pelvis W Contrast  ESR raised  Abdominal pain, unspecified abdominal location - this is better with miralax.  New  Lower ischial pain  .    Abnormal pelvic exam  Feel firm area ful lleft lower pelvic area   Rectal ok   to be done today  2 pm  Plan appopriate referral depending on results  -Patient advised to return or notify health care team  if symptoms worsen ,persist or new concerns arise.  Patient Instructions  Plan CTscan of abdomen   Pelvis  Will let you know results  Infectious disease  consult  Or gyne depending on results . Marland Kitchen   Standley Brooking. Syaire Saber M.D.

## 2015-05-13 NOTE — Telephone Encounter (Signed)
Probable large multiloculated tubo ovarian abscess.   Reported to patient  Disc with on call obgyne team dr leggette and shannon    [facility]  [facility]  Disc expectant  Treatment plan with patient.

## 2015-05-14 ENCOUNTER — Encounter (HOSPITAL_COMMUNITY): Payer: Self-pay | Admitting: *Deleted

## 2015-05-14 ENCOUNTER — Inpatient Hospital Stay (HOSPITAL_COMMUNITY): Payer: 59

## 2015-05-14 LAB — GENTAMICIN LEVEL, RANDOM: GENTAMICIN RM: 1.9 ug/mL

## 2015-05-14 MED ORDER — GENTAMICIN SULFATE 40 MG/ML IJ SOLN
7.0000 mg/kg | INTRAVENOUS | Status: DC
  Administered 2015-05-14 – 2015-05-19 (×6): 450 mg via INTRAVENOUS
  Filled 2015-05-14 (×8): qty 11.25

## 2015-05-14 NOTE — Progress Notes (Signed)
ANTIBIOTIC CONSULT NOTE - FOLLOW UP  Pharmacy Consult for gentamicin Indication: TOA  No Known Allergies  Patient Measurements: Height:  (157.5 cm) Weight: 187 lb 8 oz (85.049 kg) IBW/kg (Calculated) : 50.1 Adjusted Body Weight: 64.1kg  Vital Signs: Temp: 98.6 F (37 C) (08/19 1153) Temp Source: Oral (08/19 1153) BP: 97/60 mmHg (08/19 1153) Pulse Rate: 85 (08/19 1153) Intake/Output from previous day: 08/18 0701 - 08/19 0700 In: 836 [I.V.:625; IV Piggyback:211] Out: 500 [Urine:500] Intake/Output from this shift: Total I/O In: -  Out: 700 [Urine:700]  Labs:  Recent Labs  05/13/15 2315  WBC 8.8  HGB 8.4*  PLT 482*  CREATININE 0.74   Estimated Creatinine Clearance: 104.1 mL/min (by C-G formula based on Cr of 0.74).  Recent Labs  05/14/15 1118  GENTRANDOM 1.9     Microbiology: No results found for this or any previous visit (from the past 720 hour(s)).  Anti-infectives    Start     Dose/Rate Route Frequency Ordered Stop   05/14/15 0100  gentamicin (GARAMYCIN) 450 mg in dextrose 5 % 100 mL IVPB     7 mg/kg  64.1 kg (Adjusted) 111.3 mL/hr over 60 Minutes Intravenous Every 24 hours 05/14/15 0004     05/14/15 0000  ampicillin (OMNIPEN) 2 g in sodium chloride 0.9 % 50 mL IVPB     2 g 150 mL/hr over 20 Minutes Intravenous 4 times per day 05/13/15 2357     05/14/15 0000  clindamycin (CLEOCIN) IVPB 900 mg     900 mg 100 mL/hr over 30 Minutes Intravenous 3 times per day 05/13/15 2357        Assessment: 29 y.o. female presents with TOA as seen on CT. Pt is febrile and in pain. [place] planned for AM to evaluate. On Triple abx.  Goal of Therapy: Gent peak 6-8 mg/L and trough < 1 mg/L  Plan:  Gentamicin 450 mg ( /kg) IV Q24 hours.  Ten hour random gent level drawn at 1118 today was within the every 24 hour dosing window.  Will continue to follow closely for resolution of symptoms.   Hurley Cisco 05/14/2015,1:02 PM

## 2015-05-14 NOTE — Progress Notes (Signed)
ANTIBIOTIC CONSULT NOTE - INITIAL  Pharmacy Consult for Gentamicin Indication: TOA  No Known Allergies  Patient Measurements: Height:  (157.5 cm) Weight: 187 lb 8 oz (85.049 kg) IBW/kg (Calculated) : 50.1 Adjusted Body Weight: 64.1 kg  Vital Signs: Temp: 99.7 F (37.6 C) (08/18 2156) Temp Source: Oral (08/18 2156) BP: 122/76 mmHg (08/18 2156) Pulse Rate: 94 (08/18 2156)  Labs:  Recent Labs  05/13/15 2315  WBC 8.8  HGB 8.4*  PLT 482*  CREATININE 0.74   No results for input(s): GENTTROUGH, GENTPEAK, GENTRANDOM in the last 72 hours.   Microbiology: No results found for this or any previous visit (from the past 720 hour(s)).  Medications:  Ampicillin 2 grams IV Q 6 hours Clindamycin 900 mg IV Q 8 hours  Assessment: 29 y.o. female presents with TOA as seen on CT. Pt is febrile and in pain. [place] planned for AM to evaluate. Will start gentamicin extended interval dosing.   Goal of Therapy:  Gentamicin peak 6-8 mg/L and Trough < 1 mg/L  Plan:  Gentamicin 450 mg ( /kg) IV Q24 hours  Gentamicin level  10 hours after first dose to determine if frequency should be changed based on [place] Nomogram   Regan Mcbryar Scarlett 05/14/2015,12:45 AM

## 2015-05-15 MED ORDER — FLUCONAZOLE 100 MG PO TABS
100.0000 mg | ORAL_TABLET | Freq: Every day | ORAL | Status: DC
  Administered 2015-05-16 – 2015-05-21 (×5): 100 mg via ORAL
  Filled 2015-05-15 (×6): qty 1

## 2015-05-15 MED ORDER — FLUCONAZOLE 150 MG PO TABS
150.0000 mg | ORAL_TABLET | Freq: Once | ORAL | Status: AC
Start: 2015-05-15 — End: 2015-05-15
  Administered 2015-05-15: 150 mg via ORAL
  Filled 2015-05-15: qty 1

## 2015-05-15 NOTE — Progress Notes (Signed)
Patient ID: [name], female   DOB: [birth year], 29 y.o.   MRN: [id number]   Subjective: Interval History:continues to have fevers. Pain feels improved.  Objective: Vital signs in last 24 hours: Temp:  [98.6 F (37 C)-100.4 F (38 C)] 99.4 F (37.4 C) (08/20 4098) Pulse Rate:  [85-95] 92 (08/20 0613) Resp:  [16-18] 18 (08/20 0613) BP: (94-112)/(50-67) 101/50 mmHg (08/20 0613) SpO2:  [99 %-100 %] 99 % (08/20 0613)  Intake/Output from previous day: 08/19 0701 - 08/20 0700 In: 3544.2 [P.O.:1560; I.V.:1622.9] Out: 4650 [Urine:4650] Intake/Output this shift:    General appearance: alert, cooperative and appears stated age Head: Normocephalic, without obvious abnormality, atraumatic, no lesion Lungs: normal effort Heart: regular rate and rhythm Abdomen: soft, TTP lower quadrants, + rebound Extremities: extremities normal, atraumatic, no cyanosis or edema  Results for orders placed or performed during the hospital encounter of 05/13/15 (from the past 24 hour(s))  Gentamicin level, random     Status: None   Collection Time: 05/14/15 11:18 AM  Result Value Ref Range   Gentamicin Rm 1.9 ug/mL    Studies/Results: Pelvic sono Complex right adnexal mass measuring up to 11.4 cm with dilated cystic tubular component.  Complex left adnexal mass measuring up to 6.4 cm with multiple cystic components.  These findings are compatible with bilateral tubo-ovarian abscesses.  Scheduled Meds: . ampicillin (OMNIPEN) IV  2 g Intravenous 4 times per day  . clindamycin (CLEOCIN) IV  900 mg Intravenous 3 times per day  . gentamicin  7 mg/kg (Adjusted) Intravenous Q24H  . prenatal multivitamin  1 tablet Oral Q1200   Continuous Infusions: . sodium chloride 125 mL/hr at 05/15/15 0640   PRN Meds:acetaminophen, alum & mag hydroxide-simeth, HYDROmorphone (DILAUDID) injection, ondansetron **OR** ondansetron (ZOFRAN) IV, oxyCODONE-acetaminophen, polyethylene glycol,  zolpidem  Assessment/Plan: Principal Problem:   Tubo-ovarian abscess  Mildly improved from pain standpoint, continue to have low grade fever.  May need IR drainage if fevers persist.   LOS: 2 days   [name] S, MD 05/15/2015 7:39 AM

## 2015-05-16 DIAGNOSIS — B9561 Methicillin susceptible Staphylococcus aureus infection as the cause of diseases classified elsewhere: Secondary | ICD-10-CM

## 2015-05-16 DIAGNOSIS — Z975 Presence of (intrauterine) contraceptive device: Secondary | ICD-10-CM

## 2015-05-16 DIAGNOSIS — N7093 Salpingitis and oophoritis, unspecified: Secondary | ICD-10-CM

## 2015-05-16 NOTE — Progress Notes (Signed)
Subjective: Patient reports feels pretty good.    Objective: I have reviewed patient's vital signs, intake and output, medications and labs.  General: alert, cooperative and no distress GI: soft, non-tender; bowel sounds normal; no masses,  no organomegaly  Filed Vitals:   05/15/15 1434 05/15/15 1832 05/15/15 2120 05/16/15 0555  BP:  127/72 119/81 100/62  Pulse:  92 91 88  Temp: 98.9 F (37.2 C) 99.6 F (37.6 C) 99.2 F (37.3 C) 98.5 F (36.9 C)  TempSrc: Oral Oral Oral Oral  Resp:  Height:      Weight:      SpO2:  98% 100% 100%     Assessment/Plan: Bilateral TOA   LOS: 3 days    EURE,LUTHER H 05/16/2015, 7:54 AM    Patient ID: [name], female   DOB: 06-27-1986, 29 y.o.   MRN: [id number]   Subjective: Interval History:continues to have fevers. Pain feels improved.  Objective: Vital signs in last 24 hours: Temp:  [98.5 F (36.9 C)-99.6 F (37.6 C)] 98.5 F (36.9 C) (08/21 0555) Pulse Rate:  [85-92] 88 (08/21 0555) Resp:  [18] 18 (08/21 0555) BP: (100-127)/(62-81) 100/62 mmHg (08/21 0555) SpO2:  [98 %-100 %] 100 % (08/21 0555)  Intake/Output from previous day: 08/20 0701 - 08/21 0700 In: 5779 [P.O.:3420; I.V.:1897.7] Out: 5600 [Urine:5600] Intake/Output this shift:    General appearance: alert, cooperative and appears stated age Head: Normocephalic, without obvious abnormality, atraumatic, no lesion Lungs: normal effort Heart: regular rate and rhythm Abdomen: soft, TTP lower quadrants, + rebound Extremities: extremities normal, atraumatic, no cyanosis or edema  No results found for this or any previous visit (from the past 24 hour(s)).  Studies/Results: Pelvic sono Complex right adnexal mass measuring up to 11.4 cm with dilated cystic tubular component.  Complex left adnexal mass measuring up to 6.4 cm with multiple cystic components.  These findings are compatible with bilateral tubo-ovarian abscesses.  Scheduled Meds: .  ampicillin (OMNIPEN) IV  2 g Intravenous 4 times per day  . clindamycin (CLEOCIN) IV  900 mg Intravenous 3 times per day  . fluconazole  100 mg Oral Daily  . gentamicin  7 mg/kg (Adjusted) Intravenous Q24H  . prenatal multivitamin  1 tablet Oral Q1200   Continuous Infusions: . sodium chloride 10 mL/hr at 05/16/15 0619   PRN Meds:acetaminophen, alum & mag hydroxide-simeth, HYDROmorphone (DILAUDID) injection, ondansetron **OR** ondansetron (ZOFRAN) IV, oxyCODONE-acetaminophen, polyethylene glycol, zolpidem  Assessment/Plan: Principal Problem:   Tubo-ovarian abscess  Mildly improved from pain standpoint, continue to have low grade fever.  May need IR drainage if fevers persist.   LOS: 3 days    Low grade temp continues Arrange for IR tomorrow  Lazaro Arms, MD 05/16/2015 7:54 AM

## 2015-05-17 ENCOUNTER — Ambulatory Visit (HOSPITAL_COMMUNITY)
Admit: 2015-05-17 | Discharge: 2015-05-17 | Disposition: A | Discharge status: Home / Self Care | Payer: 59 | Attending: Obstetrics & Gynecology | Admitting: Obstetrics & Gynecology

## 2015-05-17 ENCOUNTER — Encounter (HOSPITAL_COMMUNITY): Payer: Self-pay | Admitting: Radiology

## 2015-05-17 LAB — CREATININE, SERUM
CREATININE: 0.73 mg/dL (ref 0.44–1.00)
GFR calc Af Amer: >60 mL/min (ref >60)
GFR calc non Af Amer: >60 mL/min (ref >60)

## 2015-05-17 LAB — HCG, SERUM, QUALITATIVE: PREG SERUM: NEGATIVE

## 2015-05-17 LAB — PROTIME-INR
INR: 1.17 (ref 0.00–1.49)
PROTHROMBIN TIME: 15.1 s (ref 11.6–15.2)

## 2015-05-17 MED ORDER — LIDOCAINE HCL 1 % IJ SOLN
INTRAMUSCULAR | Status: AC
  Filled 2015-05-17: qty 20

## 2015-05-17 MED ORDER — MIDAZOLAM HCL 2 MG/2ML IJ SOLN
INTRAMUSCULAR | Status: AC
  Filled 2015-05-17: qty 2

## 2015-05-17 MED ORDER — SODIUM CHLORIDE 0.9 % IV SOLN
INTRAVENOUS | Status: DC | PRN
  Administered 2015-05-17: 10 mL/h via INTRAVENOUS

## 2015-05-17 MED ORDER — FENTANYL CITRATE (PF) 100 MCG/2ML IJ SOLN
INTRAMUSCULAR | Status: DC | PRN
  Administered 2015-05-17: 50 ug via INTRAVENOUS
  Administered 2015-05-17 (×3): 25 ug via INTRAVENOUS

## 2015-05-17 MED ORDER — FENTANYL CITRATE (PF) 100 MCG/2ML IJ SOLN
INTRAMUSCULAR | Status: AC
  Filled 2015-05-17: qty 2

## 2015-05-17 MED ORDER — MIDAZOLAM HCL 2 MG/2ML IJ SOLN
INTRAMUSCULAR | Status: AC
  Filled 2015-05-17: qty 4

## 2015-05-17 MED ORDER — HYDROCODONE-ACETAMINOPHEN 5-325 MG PO TABS
1.0000 | ORAL_TABLET | ORAL | Status: DC | PRN
  Administered 2015-05-17 (×2): 1 via ORAL
  Administered 2015-05-19 – 2015-05-21 (×6): 2 via ORAL
  Administered 2015-05-21 (×2): 1 via ORAL
  Filled 2015-05-17 (×2): qty 2
  Filled 2015-05-17: qty 1
  Filled 2015-05-17 (×2): qty 2
  Filled 2015-05-17: qty 1
  Filled 2015-05-17: qty 2
  Filled 2015-05-17 (×2): qty 1
  Filled 2015-05-17: qty 2

## 2015-05-17 MED ORDER — MIDAZOLAM HCL 2 MG/2ML IJ SOLN
INTRAMUSCULAR | Status: DC | PRN
  Administered 2015-05-17: 2 mg via INTRAVENOUS
  Administered 2015-05-17 (×3): 1 mg via INTRAVENOUS

## 2015-05-17 NOTE — Progress Notes (Signed)
[facility] arrived to transport patient to [facility]. IV capped per request of [facility] EMT for transport. 1200 dose of IV ampicillin sent with patient and [facility] updated that antibiotic would be sent with patient.

## 2015-05-17 NOTE — Progress Notes (Signed)
Patient ID: [name], female   DOB: 12/12/1985, 29 y.o.   MRN: [id number] Subjective: Patient reports feels well. She reports that her abdsominal pain is well controlled  Objective: Vital signs in last 24 hours: Temp:  [98.4 F (36.9 C)-99.2 F (37.3 C)] 98.7 F (37.1 C) (08/22 0524) Pulse Rate:  [79-93] 93 (08/22 0524) Resp:  [18] 18 (08/22 0524) BP: (96-103)/(65-71) 102/71 mmHg (08/22 0524) SpO2:  [100 %] 100 % (08/22 0524)  Intake/Output from previous day: 08/21 0701 - 08/22 0700 In: 2562.9 [P.O.:1920; I.V.:331.7] Out: 4050 [Urine:4050] Intake/Output this shift: Total I/O In: 1362.9 [P.O.:720; I.V.:331.7; IV Piggyback:311.3] Out: 2550 [Urine:2550]  General appearance: alert, cooperative and appears stated age Head: Normocephalic, without obvious abnormality, atraumatic, no lesion Lungs: normal effort Heart: regular rate and rhythm Abdomen: soft, TTP lower quadrants, + rebound Extremities: extremities normal, atraumatic, no cyanosis or edema  Results for orders placed or performed during the hospital encounter of 05/13/15 (from the past 24 hour(s))  Protime-INR     Status: None   Collection Time: 05/17/15  5:40 AM  Result Value Ref Range   Prothrombin Time 15.1 11.6 - 15.2 seconds   INR 1.17 0.00 - 1.49  hCG, serum, qualitative     Status: None   Collection Time: 05/17/15  5:40 AM  Result Value Ref Range   Preg, Serum NEGATIVE NEGATIVE    Studies/Results: 05/14/2015 Pelvic sono Complex right adnexal mass measuring up to 11.4 cm with dilated cystic tubular component.  Complex left adnexal mass measuring up to 6.4 cm with multiple cystic components.  These findings are compatible with bilateral tubo-ovarian abscesses.  Scheduled Meds: . ampicillin (OMNIPEN) IV  2 g Intravenous 4 times per day  . clindamycin (CLEOCIN) IV  900 mg Intravenous 3 times per day  . fluconazole  100 mg Oral Daily  . gentamicin  7 mg/kg (Adjusted) Intravenous Q24H  . prenatal  multivitamin  1 tablet Oral Q1200   Continuous Infusions: . sodium chloride 10 mL/hr at 05/17/15 0631   PRN Meds:acetaminophen, alum & mag hydroxide-simeth, HYDROmorphone (DILAUDID) injection, ondansetron **OR** ondansetron (ZOFRAN) IV, oxyCODONE-acetaminophen, polyethylene glycol, zolpidem  Assessment/Plan: 29 yo G0 with bilateral TOA - Continue parenteral antibiotics Tmax 99.1 - Plan for IR drainage of TOA today at 11 am - Continue current care    LOS: 4 days     Kaitlynn Tramontana, MD 05/17/2015 6:57 AM

## 2015-05-17 NOTE — Progress Notes (Signed)
Serum Creatinine 0.73mg /dL today.  Continue current gentamicin regimen.  Hurley Cisco, Pharm.D.

## 2015-05-17 NOTE — H&P (Signed)
Chief Complaint: Patient was seen in consultation today for tubo ovarian abscess at the request of Elsie Lincoln MD  Referring Physician(s): Elsie Lincoln MD  History of Present Illness: [name] is a 29 y.o. female with c/o fevers and pelvic pain x 2 weeks. Imaging revealed findings consistent with TOA and IR received request for percutaneous drainage. She denies any current abdominal/pelvic pain. She denies any chest pain, shortness of breath or palpitations. She denies any active signs of bleeding or excessive bruising.The patient denies any history of sleep apnea or chronic oxygen use. She has no known contraindications to sedation.    Past Medical History  Diagnosis Date  . Anemia, iron deficiency     With elevated hemoglobin A2  . Chicken pox   . Egg donor     Past Surgical History  Procedure Laterality Date  . Denies surgical hx      Allergies: Review of patient's allergies indicates no known allergies.  Medications: Prior to Admission medications   Medication Sig Start Date End Date Taking? Authorizing Provider  acetaminophen (TYLENOL) 500 MG tablet Take 1,000 mg by mouth every 6 (six) hours as needed for fever.   Yes Historical Provider, MD  doxycycline (VIBRAMYCIN) 100 MG capsule Take 1 capsule (100 mg total) by mouth 2 (two) times daily. 04/30/15  Yes Madelin Headings, MD  fexofenadine (ALLEGRA) 180 MG tablet Take 180 mg by mouth daily as needed for allergies.    Yes Historical Provider, MD  flunisolide (NASALIDE) 25 MCG/ACT (0.025%) SOLN Place 2 sprays into the nose 2 (two) times daily. 12/29/14  Yes Madelin Headings, MD  IRON PO Take 3 tablets by mouth at bedtime.   Yes Historical Provider, MD  levonorgestrel (MIRENA) 20 MCG/24HR IUD 1 each by Intrauterine route once. Was removed ~2100 8/18.   Yes Historical Provider, MD  Polyethylene Glycol 3350 (MIRALAX PO) Take 1 Dose by mouth daily as needed (constipation).   Yes Historical Provider, MD  Simethicone (GAS-X  EXTRA STRENGTH PO) Take 1 tablet by mouth every 4 (four) hours as needed (gas).   Yes Historical Provider, MD  SUMAtriptan (IMITREX) 100 MG tablet 1 po at onset  Of migraine.Can repeat in 2 hours if needed Patient taking differently: Take 100 mg by mouth every 2 (two) hours as needed for migraine. 1 po at onset  Of migraine.Can repeat in 2 hours if needed 04/25/15  Yes Madelin Headings, MD  vitamin C (ASCORBIC ACID) 500 MG tablet Take 1,000 mg by mouth daily.   Yes Historical Provider, MD     Family History  Problem Relation Age of Onset  . Hyperlipidemia Mother   . Miscarriages / [place] Mother     Mother has 1 miscarriage  . Cancer Father     Lung Cancer  . Learning disabilities Sister     Sister has Cerebral Palsy and Epilepsy  . Mental retardation Sister   . Cancer Maternal Grandfather     Liver Cancer    Social History   Social History  . Marital Status: Single    Spouse Name: N/A  . Number of Children: N/A  . Years of Education: N/A   Social History Main Topics  . Smoking status: Never Smoker   . Smokeless tobacco: Never Used  . Alcohol Use: No  . Drug Use: No  . Sexual Activity: Not Asked   Other Topics Concern  . 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 Tobacco   caffiene  Once coke zero.   2-3    ETOH:  Wine weekends.    Gym  4 x per week.     Working on knee  Problems  Has seen SM .  wendover  ocass aleve.           Review of Systems: A 12 point ROS discussed and pertinent positives are indicated in the HPI above.  All other systems are negative.  Review of Systems  Vital Signs: BP 102/60 mmHg  Pulse 84  Temp(Src) 98.8 F (37.1 C) (Oral)  Resp 18  Ht 5\' 2"  (1.575 m)  Wt 187 lb 8 oz (85.049 kg)  BMI 34.29 kg/m2  SpO2 100%  LMP 04/20/2015  Physical Exam  Constitutional: She is oriented to person, place, and  time. No distress.  HENT:  Head: Normocephalic and atraumatic.  Neck: No tracheal deviation present.  Cardiovascular: Normal rate and regular rhythm.  Exam reveals no gallop and no friction rub.   No murmur heard. Pulmonary/Chest: Effort normal and breath sounds normal. No respiratory distress. She has no wheezes. She has no rales.  Abdominal: Soft. Bowel sounds are normal. She exhibits no distension. There is no tenderness.  Neurological: She is alert and oriented to person, place, and time.  Skin: Skin is warm and dry. She is not diaphoretic.    Mallampati Score:  MD Evaluation Airway: WNL Heart: WNL Abdomen: WNL Chest/ Lungs: WNL ASA  Classification: 2 Mallampati/Airway Score: One  Imaging: US Transvaginal Non-ob  05/14/2015   CLINICAL DATA:  Tubo-ovarian abscess on CT  EXAM: TRANSABDOMINAL AND TRANSVAGINAL ULTRASOUND OF PELVIS  TECHNIQUE: Both transabdominal and transvaginal ultrasound examinations of the pelvis were performed. Transabdominal technique was performed for global imaging of the pelvis including uterus, ovaries, adnexal regions, and pelvic cul-de-sac. It was necessary to proceed with endovaginal exam following the transabdominal exam to visualize the bilateral adnexa.  COMPARISON:  CT abdomen pelvis dated 05/13/2015  FINDINGS: Uterus  Measurements: 7.9 x 3.3 x 4.3 cm. No fibroids or other mass visualized.  Endometrium  Thickness: 6 mm.  No focal abnormality visualized.  Right ovary  Not discretely visualized. Complex right adnexal mass measuring 11.4 x 6.1 x 5.4 cm with dilated cystic tubular component.  Left ovary  Not discretely visualized. Complex left adnexal mass measuring 6.4 x 5.8 x 5.3 cm with multiple cystic components, many of which are not simple.  Other findings  No free fluid.  IMPRESSION: Complex right adnexal mass measuring up to 11.4 cm with dilated cystic tubular component.  Complex left adnexal mass measuring up to 6.4 cm with multiple cystic components.   These findings are compatible with bilateral tubo-ovarian abscesses.   Electronically Signed   By: Charline Bills M.D.   On: 05/14/2015 10:01   US Pelvis Complete  05/14/2015   CLINICAL DATA:  Tubo-ovarian abscess on CT  EXAM: TRANSABDOMINAL AND TRANSVAGINAL ULTRASOUND OF PELVIS  TECHNIQUE: Both transabdominal and transvaginal ultrasound examinations of the pelvis were performed. Transabdominal technique was performed for global imaging of the pelvis including uterus, ovaries, adnexal regions, and pelvic cul-de-sac. It was necessary to proceed with endovaginal exam following the transabdominal exam to visualize the bilateral adnexa.  COMPARISON:  CT abdomen pelvis dated 05/13/2015  FINDINGS:  Uterus  Measurements: 7.9 x 3.3 x 4.3 cm. No fibroids or other mass visualized.  Endometrium  Thickness: 6 mm.  No focal abnormality visualized.  Right ovary  Not discretely visualized. Complex right adnexal mass measuring 11.4 x 6.1 x 5.4 cm with dilated cystic tubular component.  Left ovary  Not discretely visualized. Complex left adnexal mass measuring 6.4 x 5.8 x 5.3 cm with multiple cystic components, many of which are not simple.  Other findings  No free fluid.  IMPRESSION: Complex right adnexal mass measuring up to 11.4 cm with dilated cystic tubular component.  Complex left adnexal mass measuring up to 6.4 cm with multiple cystic components.  These findings are compatible with bilateral tubo-ovarian abscesses.   Electronically Signed   By: Charline Bills M.D.   On: 05/14/2015 10:01   Ct Abdomen Pelvis W Contrast  05/13/2015   CLINICAL DATA:  Fever for 10 days. Abnormal pelvic exam. Pelvic mass.  EXAM: CT ABDOMEN AND PELVIS WITH CONTRAST  TECHNIQUE: Multidetector CT imaging of the abdomen and pelvis was performed using the standard protocol following bolus administration of intravenous contrast.  CONTRAST:  OMNIPAQUE IOHEXOL 300 MG/ML  SOLN  COMPARISON:  04/27/2015  FINDINGS: Musculoskeletal:  No  aggressive osseous lesions.  Lung Bases: Clear.  Liver:  Normal.  Spleen:  Normal.  Gallbladder:  Normal.  Common bile duct:  Normal.  Pancreas:  Normal.  Adrenal glands:  Normal.  Kidneys: Normal enhancement. LEFT ureter appears normal. RIGHT ureter appears normal.  Stomach:  Normal.  Small bowel:  Normal.  No obstruction or mesenteric adenopathy.  Colon: Normal retrocecal appendix identified. Ascending colon, transverse, descending and sigmoid colon are within normal limits. There is fat stranding extending to the rectum from pelvic mass.  Pelvic Genitourinary: The urinary bladder is partially collapsed. IUD present within the uterus. There is a complex cluster of cystic lesions with peripheral enhancement in the anatomic pelvis on both sides of the uterus, compatible with tubo-ovarian abscess. Surrounding inflammatory changes are present. In aggregate, the pelvic mass measures 9 cm transverse, 6.8 cm craniocaudal and at least 7.3 cm AP.  Peritoneum: No free air.  Vascular/lymphatic: No acute vascular abnormality. Small retroperitoneal lymph nodes are likely reactive to pelvic process.  Body Wall: Normal.  IMPRESSION: 1. Complex multilocular inflammatory mass in the anatomic pelvis compatible with tubo-ovarian abscess. 2. IUD present in the uterus which is displaced inferiorly by the mass. 3. These results will be called to the ordering clinician or representative by the Radiologist Assistant, and communication documented in the PACS or zVision Dashboard.   Electronically Signed   By: Andreas Newport M.D.   On: 05/13/2015 16:50   Dg Abd 2 Views  04/27/2015   CLINICAL DATA:  Lower quadrant abdominal pain and constipation for the past 2 weeks  EXAM: ABDOMEN - 2 VIEW  COMPARISON:  None in PACs  FINDINGS: The colonic stool burden is diffusely increased. There is no small or large bowel obstructive pattern. There are no free extraluminal gas collections. There are no abnormal soft tissue calcifications. An IUD is  present. The bony structures are unremarkable.  IMPRESSION: Increase colonic stool burden consistent with clinical constipation. No acute abnormality otherwise.   Electronically Signed   By: David  [place] M.D.   On: 04/27/2015 11:42    Labs:  CBC:  Recent Labs  02/09/15 0815 04/27/15 1030 04/30/15 1603 05/13/15 2315  WBC 4.6 6.6 11.8* 8.8  HGB 10.8* 9.7* 9.3* 8.4*  HCT 33.2* 30.2* 28.8*  26.3*  PLT 294.0 399.0 442* 482*    COAGS:  Recent Labs  05/17/15 0540  INR 1.17    BMP:  Recent Labs  08/11/14 0805 04/27/15 1030 05/13/15 2315 05/17/15 0540  NA 140 136 134*  --   K 4.3 4.6 3.4*  --   CL 109 101 99*  --   CO2 22 30 26   --   GLUCOSE 79 98 103*  --   BUN 11 8 <5*  --   CALCIUM 9.1 9.5 8.8*  --   CREATININE 0.8 0.74 0.74 0.73  GFRNONAA  --   --  >60 >60  GFRAA  --   --  >60 >60    LIVER FUNCTION TESTS:  Recent Labs  08/11/14 0805 04/27/15 1030 04/30/15 1603 05/13/15 2315  BILITOT 0.4 0.2 0.2 0.5  AST 20 22 14 16   ALT 24 43* 27 13*  ALKPHOS 57 133* 137* 82  PROT 7.9 8.6* 8.2* 8.4*  ALBUMIN 4.2 4.1 3.9 3.3*    Assessment and Plan: Pelvic pain and fevers x 2 weeks CT 05/13/15 with findings consistent with Tubo-ovarian abscess Scheduled today for image guided tubo-ovarian abscess drain placement with sedation The patient has been NPO, no blood thinners taken, labs and vitals have been reviewed. Risks and Benefits discussed with the patient including bleeding, infection, damage to adjacent structures, bowel perforation/fistula connection and ovarian failure.  All of the patient's questions were answered, patient is agreeable to proceed. Consent signed and in chart   Thank you for this interesting consult.  I greatly enjoyed meeting [name] and look forward to participating in their care.  A copy of this report was sent to the requesting provider on this date.  SignedBerneta Levins 05/17/2015, 11:31 AM   I spent a total of 20 Minutes in  face to face in clinical consultation, greater than 50% of which was counseling/coordinating care for tubo ovarian abscess.

## 2015-05-17 NOTE — Procedures (Signed)
CT 75f abscess drain L TOA, 25ml bloody purulent aspiration,sample for GS C&S No complication No blood loss. See complete dictation in [facility]

## 2015-05-18 NOTE — Progress Notes (Signed)
Drain flushed per order with 5 mL of normal saline. Patient tolerated well, will continue to monitor.

## 2015-05-18 NOTE — Progress Notes (Signed)
Subjective:mild nausea but appetite improving Patient reports nausea, tolerating PO and no problems voiding.    Objective: I have reviewed patient's vital signs, intake and output, medications, labs and microbiology. Filed Vitals:   05/18/15 0624  BP: 113/73  Pulse: 102  Temp: 98.9 F (37.2 C)  Resp: 15    General: alert, cooperative and no distress GI: soft, non-tender; bowel sounds normal; no masses,  no organomegaly Bloody drainage in bag  Intake/Output Summary (Last 24 hours) at 05/18/15 1030 Last data filed at 05/18/15 0640  Gross per 24 hour  Intake 324.83 ml  Output    330 ml  Net  -5.17 ml     Assessment/Plan: POD 1 s/p IR drainage of TOA   LOS: 5 days    ARNOLD,JAMES 05/18/2015, 10:28 AM

## 2015-05-19 MED ORDER — PIPERACILLIN-TAZOBACTAM 3.375 G IVPB
3.3750 g | Freq: Three times a day (TID) | INTRAVENOUS | Status: DC
  Administered 2015-05-19 – 2015-05-21 (×5): 3.375 g via INTRAVENOUS
  Filled 2015-05-19 (×6): qty 50

## 2015-05-19 MED ORDER — METRONIDAZOLE 500 MG PO TABS
500.0000 mg | ORAL_TABLET | Freq: Two times a day (BID) | ORAL | Status: DC
  Administered 2015-05-19 – 2015-05-20 (×2): 500 mg via ORAL
  Filled 2015-05-19 (×4): qty 1

## 2015-05-19 NOTE — Progress Notes (Signed)
Subjective:pain managed with current medication Patient reports tolerating PO and + BM.  Appetite still decreased   Objective: I have reviewed patient's vital signs, intake and output, medications and microbiology. Blood pressure 104/61, pulse 96, temperature 98.8 F (37.1 C), temperature source Oral, resp. rate 16, height  (1.575 m), weight 85.049 kg (187 lb 8 oz), last menstrual period 04/20/2015, SpO2 97 %.  General: alert, cooperative and no distress GI: soft, non-tender; bowel sounds normal; no masses,  no organomegaly  I/O last 3 completed shifts: In: 694.8 [I.V.:274.8; Other:20; IV Piggyback:400] Out: 1710 [Urine:1650; Drains:45; Other:15]  Assessment/Plan: Continue current ABX and drain in place still draining  LOS: 6 days    ARNOLD,JAMES 05/19/2015, 6:59 AM

## 2015-05-20 LAB — CULTURE, ROUTINE-ABSCESS

## 2015-05-20 MED ORDER — PSEUDOEPHEDRINE HCL 30 MG PO TABS
60.0000 mg | ORAL_TABLET | Freq: Four times a day (QID) | ORAL | Status: DC | PRN
  Administered 2015-05-20 (×2): 60 mg via ORAL
  Filled 2015-05-20 (×2): qty 2

## 2015-05-20 NOTE — Progress Notes (Signed)
  Filed Vitals:   05/19/15 1730 05/19/15 2105 05/20/15 0208 05/20/15 0657  BP: 106/58 131/84  107/53  Pulse: 95 93  94  Temp: 98.9 F (37.2 C) 98.2 F (36.8 C) 98.2 F (36.8 C) 98.7 F (37.1 C)  TempSrc: Oral  Oral Oral  Resp: Height:      Weight:      SpO2: 100% 100%  100%        Patient ID: [name], female   DOB: [birth year], 28 y.o.   MRN: [id number]   Subjective: Interval History:continues to have fevers. Pain feels improved.  Objective: Vital signs in last 24 hours: Temp:  [98.2 F (36.8 C)-98.9 F (37.2 C)] 98.7 F (37.1 C) (08/25 0657) Pulse Rate:  [88-95] 94 (08/25 0657) Resp:  [16-18] 16 (08/25 0657) BP: (104-131)/(53-84) 107/53 mmHg (08/25 0657) SpO2:  [99 %-100 %] 100 % (08/25 0657)  Intake/Output from previous day: 08/24 0701 - 08/25 0700 In: 733.5 [P.O.:360; I.V.:363.5] Out: 2345 [Urine:2325; Drains:20] Intake/Output this shift: Total I/O In: 92.2 [I.V.:87.2; Other:5] Out: 5 [Drains:5]  General appearance: alert, cooperative and appears stated age Head: Normocephalic, without obvious abnormality, atraumatic, no lesion Lungs: normal effort Heart: regular rate and rhythm Abdomen: soft, TTP lower quadrants, + rebound Extremities: extremities normal, atraumatic, no cyanosis or edema  No results found for this or any previous visit (from the past 24 hour(s)).  Studies/Results:   Scheduled Meds: . fluconazole  100 mg Oral Daily  . metroNIDAZOLE  500 mg Oral Q12H  . piperacillin-tazobactam  3.375 g Intravenous Q8H  . prenatal multivitamin  1 tablet Oral Q1200   Continuous Infusions: . sodium chloride 10 mL/hr at 05/17/15 1508   PRN Meds:acetaminophen, alum & mag hydroxide-simeth, HYDROcodone-acetaminophen, HYDROmorphone (DILAUDID) injection, ondansetron **OR** ondansetron (ZOFRAN) IV, oxyCODONE-acetaminophen, polyethylene glycol, pseudoephedrine, zolpidem  Assessment/Plan: Principal Problem:   Tubo-ovarian abscess POD # from  IR drainage  Changed antibiotics due to continued low grade fever 48 hours after IR drainage    LOS: 7 days     Lazaro Arms, MD 05/20/2015 7:16 AM

## 2015-05-21 MED ORDER — INFLUENZA VAC SPLIT QUAD 0.5 ML IM SUSY: 0.5000 mL | PREFILLED_SYRINGE | INTRAMUSCULAR | Status: DC

## 2015-05-21 MED ORDER — HYDROCODONE-ACETAMINOPHEN 5-325 MG PO TABS: 1.0000 | ORAL_TABLET | Freq: Four times a day (QID) | ORAL | Status: DC | PRN

## 2015-05-21 MED ORDER — AMOXICILLIN-POT CLAVULANATE 875-125 MG PO TABS: 1.0000 | ORAL_TABLET | Freq: Two times a day (BID) | ORAL | Status: DC

## 2015-05-21 NOTE — Discharge Instructions (Signed)
Discharge Instructions    Call MD for:  difficulty breathing, headache or visual disturbances          Call MD for:  persistant nausea and vomiting          Call MD for:  redness, tenderness, or signs of infection (pain, swelling, redness, odor or green/yellow discharge around incision site)          Call MD for:  severe uncontrolled pain          Call MD for:  temperature >100.4          Diet - low sodium heart healthy          Increase activity slowly          Leave dressing on - Keep it clean, dry, and intact until clinic visit          Sexual Activity Restrictions       No intercourse until released by physician

## 2015-05-21 NOTE — Discharge Summary (Signed)
Physician Discharge Summary  Patient ID: [name] MRN: [id number] DOB/AGE: 1985-12-05 28 y.o.  Admit date: 05/13/2015 Discharge date: 05/21/2015  Admission Diagnoses:  1. Tubo-ovarian abscess - bilateral Discharge Diagnoses:  Principal Problem:   Tubo-ovarian abscess   Discharged Condition: good  Hospital Course: Patient was admitted on 8/18-2 bilateral tubo-ovarian abscesses with failed outpatient treatment with doxycycline. Patient was having fevers. She was admitted on ampicillin, gentamicin, and clindamycin. On 8/22 the patient went for drainage with interventional radiology. Patient tolerated procedure well. Due to continued fevers, the patient's biotic regimen was switched to Zosyn. She tolerated this well and was afebrile since 8/23. The culture showed staph aureus which was sensitive to all antibiotics. She'll be sent home on Augmentin to follow-up in the clinic in approximately 2 weeks. Her antibiotics will continue for those 2 weeks.  Consults: interventional radiology  Significant Diagnostic Studies: microbiology: wound culture: positive for staph aures  Treatments: antibiotics: Initially admitted on Ampicillin, gentamycin, and clindamycin, ended hospitalization on Zosyn.   Discharge Exam: Blood pressure 104/66, pulse 85, temperature 97.8 F (36.6 C), temperature source Oral, resp. rate 18, height  (1.575 m), weight 187 lb 8 oz (85.049 kg), last menstrual period 04/20/2015, SpO2 99 %. General appearance: alert, cooperative and mild distress Resp: clear to auscultation bilaterally Cardio: regular rate and rhythm, S1, S2 normal, no murmur, click, rub or gallop GI: soft, non-tender; bowel sounds normal; no masses,  no organomegaly Extremities: extremities normal, atraumatic, no cyanosis or edema Pulses: 2+ and symmetric Skin: Skin color, texture, turgor normal. No rashes or lesions  Disposition: Final discharge disposition not confirmed  Discharge Instructions    Call MD for:  difficulty breathing, headache or visual disturbances    Complete by:  As directed      Call MD for:  persistant nausea and vomiting    Complete by:  As directed      Call MD for:  redness, tenderness, or signs of infection (pain, swelling, redness, odor or green/yellow discharge around incision site)    Complete by:  As directed      Call MD for:  severe uncontrolled pain    Complete by:  As directed      Call MD for:  temperature >100.4    Complete by:  As directed      Diet - low sodium heart healthy    Complete by:  As directed      Increase activity slowly    Complete by:  As directed      Leave dressing on - Keep it clean, dry, and intact until clinic visit    Complete by:  As directed      Sexual Activity Restrictions    Complete by:  As directed   No intercourse until released by physician            Medication List    STOP taking these medications        doxycycline 100 MG capsule  Commonly known as:  VIBRAMYCIN      TAKE these medications        acetaminophen 500 MG tablet  Commonly known as:  TYLENOL  Take 1,000 mg by mouth every 6 (six) hours as needed for fever.     amoxicillin-clavulanate 875-125 MG per tablet  Commonly known as:  AUGMENTIN  Take 1 tablet by mouth 2 (two) times daily.     fexofenadine 180 MG tablet  Commonly known as:  ALLEGRA  Take 180 mg by mouth daily  as needed for allergies.     flunisolide 25 MCG/ACT (0.025%) Soln  Commonly known as:  NASALIDE  Place 2 sprays into the nose 2 (two) times daily.     GAS-X EXTRA STRENGTH PO  Take 1 tablet by mouth every 4 (four) hours as needed (gas).     HYDROcodone-acetaminophen 5-325 MG per tablet  Commonly known as:  NORCO/VICODIN  Take 1-2 tablets by mouth every 6 (six) hours as needed for moderate pain.     IRON PO  Take 3 tablets by mouth at bedtime.     levonorgestrel 20 MCG/24HR IUD  Commonly known as:  MIRENA  1 each by Intrauterine route once. Was removed ~2100  8/18.     MIRALAX PO  Take 1 Dose by mouth daily as needed (constipation).     SUMAtriptan 100 MG tablet  Commonly known as:  IMITREX  1 po at onset  Of migraine.Can repeat in 2 hours if needed     vitamin C 500 MG tablet  Commonly known as:  ASCORBIC ACID  Take 1,000 mg by mouth daily.           Follow-up Information    Follow up with [facility] In 2 weeks.   Specialty:  Obstetrics and Gynecology   Contact information:   [address] [place] [address] [phone]      Signed: Candelaria Celeste JEHIEL 05/21/2015, 9:00 AM

## 2015-06-02 ENCOUNTER — Ambulatory Visit (INDEPENDENT_AMBULATORY_CARE_PROVIDER_SITE_OTHER): Payer: 59 | Admitting: Obstetrics & Gynecology

## 2015-06-02 ENCOUNTER — Other Ambulatory Visit: Payer: Self-pay | Admitting: Obstetrics & Gynecology

## 2015-06-02 ENCOUNTER — Encounter: Payer: Self-pay | Admitting: Obstetrics & Gynecology

## 2015-06-02 VITALS — BP 112/88 | HR 68 | Ht 61.0 in | Wt 183.0 lb

## 2015-06-02 DIAGNOSIS — N7093 Salpingitis and oophoritis, unspecified: Secondary | ICD-10-CM

## 2015-06-02 DIAGNOSIS — Z30013 Encounter for initial prescription of injectable contraceptive: Secondary | ICD-10-CM | POA: Diagnosis not present

## 2015-06-02 MED ORDER — MEDROXYPROGESTERONE ACETATE 150 MG/ML IM SUSP
150.0000 mg | INTRAMUSCULAR | Status: DC
  Administered 2015-06-02 – 2020-05-17 (×17): 150 mg via INTRAMUSCULAR

## 2015-06-02 NOTE — Patient Instructions (Signed)
Medroxyprogesterone injection [Contraceptive] What is this medicine? MEDROXYPROGESTERONE (me DROX ee proe JES te rone) contraceptive injections prevent pregnancy. They provide effective birth control for 3 months. Depo-subQ Provera 104 is also used for treating pain related to endometriosis. This medicine may be used for other purposes; ask your health care provider or pharmacist if you have questions. COMMON BRAND NAME(S): Depo-Provera, Depo-subQ Provera 104 What should I tell my health care provider before I take this medicine? They need to know if you have any of these conditions: -frequently drink alcohol -asthma -blood vessel disease or a history of a blood clot in the lungs or legs -bone disease such as osteoporosis -breast cancer -diabetes -eating disorder (anorexia nervosa or bulimia) -high blood pressure -HIV infection or AIDS -kidney disease -liver disease -mental depression -migraine -seizures (convulsions) -stroke -tobacco smoker -vaginal bleeding -an unusual or allergic reaction to medroxyprogesterone, other hormones, medicines, foods, dyes, or preservatives -pregnant or trying to get pregnant -breast-feeding How should I use this medicine? Depo-Provera Contraceptive injection is given into a muscle. Depo-subQ Provera 104 injection is given under the skin. These injections are given by a health care professional. You must not be pregnant before getting an injection. The injection is usually given during the first 5 days after the start of a menstrual period or 6 weeks after delivery of a baby. Talk to your pediatrician regarding the use of this medicine in children. Special care may be needed. These injections have been used in female children who have started having menstrual periods. Overdosage: If you think you have taken too much of this medicine contact a poison control center or emergency room at once. NOTE: This medicine is only for you. Do not share this medicine  with others. What if I miss a dose? Try not to miss a dose. You must get an injection once every 3 months to maintain birth control. If you cannot keep an appointment, call and reschedule it. If you wait longer than 13 weeks between Depo-Provera contraceptive injections or longer than 14 weeks between Depo-subQ Provera 104 injections, you could get pregnant. Use another method for birth control if you miss your appointment. You may also need a pregnancy test before receiving another injection. What may interact with this medicine? Do not take this medicine with any of the following medications: -bosentan This medicine may also interact with the following medications: -aminoglutethimide -antibiotics or medicines for infections, especially rifampin, rifabutin, rifapentine, and griseofulvin -aprepitant -barbiturate medicines such as phenobarbital or primidone -bexarotene -carbamazepine -medicines for seizures like ethotoin, felbamate, oxcarbazepine, phenytoin, topiramate -modafinil -St. John's wort This list may not describe all possible interactions. Give your health care provider a list of all the medicines, herbs, non-prescription drugs, or dietary supplements you use. Also tell them if you smoke, drink alcohol, or use illegal drugs. Some items may interact with your medicine. What should I watch for while using this medicine? This drug does not protect you against HIV infection (AIDS) or other sexually transmitted diseases. Use of this product may cause you to lose calcium from your bones. Loss of calcium may cause weak bones (osteoporosis). Only use this product for more than 2 years if other forms of birth control are not right for you. The longer you use this product for birth control the more likely you will be at risk for weak bones. Ask your health care professional how you can keep strong bones. You may have a change in bleeding pattern or irregular periods. Many females stop having    periods while taking this drug. If you have received your injections on time, your chance of being pregnant is very low. If you think you may be pregnant, see your health care professional as soon as possible. Tell your health care professional if you want to get pregnant within the next year. The effect of this medicine may last a long time after you get your last injection. What side effects may I notice from receiving this medicine? Side effects that you should report to your doctor or health care professional as soon as possible: -allergic reactions like skin rash, itching or hives, swelling of the face, lips, or tongue -breast tenderness or discharge -breathing problems -changes in vision -depression -feeling faint or lightheaded, falls -fever -pain in the abdomen, chest, groin, or leg -problems with balance, talking, walking -unusually weak or tired -yellowing of the eyes or skin Side effects that usually do not require medical attention (report to your doctor or health care professional if they continue or are bothersome): -acne -fluid retention and swelling -headache -irregular periods, spotting, or absent periods -temporary pain, itching, or skin reaction at site where injected -weight gain This list may not describe all possible side effects. Call your doctor for medical advice about side effects. You may report side effects to FDA at 1-800-FDA-1088. Where should I keep my medicine? This does not apply. The injection will be given to you by a health care professional. NOTE: This sheet is a summary. It may not cover all possible information. If you have questions about this medicine, talk to your doctor, pharmacist, or health care provider.  2015, Elsevier/Gold Standard. (2008-10-02 18:37:56) Pelvic Inflammatory Disease Pelvic inflammatory disease (PID) refers to an infection in some or all of the female organs. The infection can be in the uterus, ovaries, fallopian tubes, or  the surrounding tissues in the pelvis. PID can cause abdominal or pelvic pain that comes on suddenly (acute pelvic pain). PID is a serious infection because it can lead to lasting (chronic) pelvic pain or the inability to have children (infertile).  CAUSES  The infection is often caused by the normal bacteria found in the vaginal tissues. PID may also be caused by an infection that is spread during sexual contact. PID can also occur following:   The birth of a baby.   A miscarriage.   An abortion.   Major pelvic surgery.   The use of an intrauterine device (IUD).   A sexual assault.  RISK FACTORS Certain factors can put a person at higher risk for PID, such as:  Being younger than 25 years.  Being sexually active at [place] age.  Usingnonbarrier contraception.  Havingmultiple sexual partners.  Having sex with someone who has symptoms of a genital infection.  Using oral contraception. Other times, certain behaviors can increase the possibility of getting PID, such as:  Having sex during your period.  Using a vaginal douche.  Having an intrauterine device (IUD) in place. SYMPTOMS   Abdominal or pelvic pain.   Fever.   Chills.   Abnormal vaginal discharge.  Abnormal uterine bleeding.   Unusual pain shortly after finishing your period. DIAGNOSIS  Your caregiver will choose some of the following methods to make a diagnosis, such as:   Performinga physical exam and history. A pelvic exam typically reveals a very tender uterus and surrounding pelvis.   Ordering laboratory tests including a pregnancy test, blood tests, and urine test.  Orderingcultures of the vagina and cervix to check for a sexually transmitted  infection (STI).  Performing an ultrasound.   Performing a laparoscopic procedure to look inside the pelvis.  TREATMENT   Antibiotic medicines may be prescribed and taken by mouth.   Sexual partners may be treated when the infection  is caused by a sexually transmitted disease (STD).   Hospitalization may be needed to give antibiotics intravenously.  Surgery may be needed, but this is rare. It may take weeks until you are completely well. If you are diagnosed with PID, you should also be checked for human immunodeficiency virus (HIV). HOME CARE INSTRUCTIONS   If given, take your antibiotics as directed. Finish the medicine even if you start to feel better.   Only take over-the-counter or prescription medicines for pain, discomfort, or fever as directed by your caregiver.   Do not have sexual intercourse until treatment is completed or as directed by your caregiver. If PID is confirmed, your recent sexual partner(s) will need treatment.   Keep your follow-up appointments. SEEK MEDICAL CARE IF:   You have increased or abnormal vaginal discharge.   You need prescription medicine for your pain.   You vomit.   You cannot take your medicines.   Your partner has an STD.  SEEK IMMEDIATE MEDICAL CARE IF:   You have a fever.   You have increased abdominal or pelvic pain.   You have chills.   You have pain when you urinate.   You are not better after 72 hours following treatment.  MAKE SURE YOU:   Understand these instructions.  Will watch your condition.  Will get help right away if you are not doing well or get worse. Document Released: 09/11/2005 Document Revised: 01/06/2013 Document Reviewed: 09/07/2011 [facility] Patient Information 2015 [place], [place]. This information is not intended to replace advice given to you by your health care provider. Make sure you discuss any questions you have with your health care provider.

## 2015-06-02 NOTE — Progress Notes (Signed)
Patient ID: [name], female   DOB: Oct 23, 1985, 29 y.o.   MRN: [id number] History:  29 y.o. G0P0000 here today for f/u of /TOA.  Pt was admitted 05/13/2015 for. Bilateral TOAs.  She was beign evaluated for 1 month h/o constipation and 2 week h/o fever and abd pain.  Pt s/p CT guided drainage on 8/22.  Pt reports that the drain was removed on the 26th of Aug.  Pt currently on atbx- has 1/14 more days left.  Pt denies fever or chills since 8/28.  She reports that her pain is 1/10.  Pt completely off pain meds.  She reports she is not worried about a STI. She and her partner were both treated and she has been with him for 4 years.   The following portions of the patient's history were reviewed and updated as appropriate: allergies, current medications, past family history, past medical history, past social history, past surgical history and problem list.  Review of Systems:  Pertinent items are noted in HPI.  Objective:  Physical Exam There were no vitals taken for this visit. Gen: NAD Abd: Soft, nontender and nondistended Pelvic: Normal appearing external genitalia; normal appearing vaginal mucosa and cervix.  Normal discharge.  Small uterus, no other palpable masses, no uterine or adnexal tenderness Buttocks: healing lesion on left buttocks covered with band aid Labs and Imaging US Transvaginal Non-ob  05/14/2015   CLINICAL DATA:  Tubo-ovarian abscess on CT  EXAM: TRANSABDOMINAL AND TRANSVAGINAL ULTRASOUND OF PELVIS  TECHNIQUE: Both transabdominal and transvaginal ultrasound examinations of the pelvis were performed. Transabdominal technique was performed for global imaging of the pelvis including uterus, ovaries, adnexal regions, and pelvic cul-de-sac. It was necessary to proceed with endovaginal exam following the transabdominal exam to visualize the bilateral adnexa.  COMPARISON:  CT abdomen pelvis dated 05/13/2015  FINDINGS: Uterus  Measurements: 7.9 x 3.3 x 4.3 cm. No fibroids or other mass  visualized.  Endometrium  Thickness: 6 mm.  No focal abnormality visualized.  Right ovary  Not discretely visualized. Complex right adnexal mass measuring 11.4 x 6.1 x 5.4 cm with dilated cystic tubular component.  Left ovary  Not discretely visualized. Complex left adnexal mass measuring 6.4 x 5.8 x 5.3 cm with multiple cystic components, many of which are not simple.  Other findings  No free fluid.  IMPRESSION: Complex right adnexal mass measuring up to 11.4 cm with dilated cystic tubular component.  Complex left adnexal mass measuring up to 6.4 cm with multiple cystic components.  These findings are compatible with bilateral tubo-ovarian abscesses.   Electronically Signed   By: Charline Bills M.D.   On: 05/14/2015 10:01   US Pelvis Complete  05/14/2015   CLINICAL DATA:  Tubo-ovarian abscess on CT  EXAM: TRANSABDOMINAL AND TRANSVAGINAL ULTRASOUND OF PELVIS  TECHNIQUE: Both transabdominal and transvaginal ultrasound examinations of the pelvis were performed. Transabdominal technique was performed for global imaging of the pelvis including uterus, ovaries, adnexal regions, and pelvic cul-de-sac. It was necessary to proceed with endovaginal exam following the transabdominal exam to visualize the bilateral adnexa.  COMPARISON:  CT abdomen pelvis dated 05/13/2015  FINDINGS: Uterus  Measurements: 7.9 x 3.3 x 4.3 cm. No fibroids or other mass visualized.  Endometrium  Thickness: 6 mm.  No focal abnormality visualized.  Right ovary  Not discretely visualized. Complex right adnexal mass measuring 11.4 x 6.1 x 5.4 cm with dilated cystic tubular component.  Left ovary  Not discretely visualized. Complex left adnexal mass measuring 6.4 x 5.8  x 5.3 cm with multiple cystic components, many of which are not simple.  Other findings  No free fluid.  IMPRESSION: Complex right adnexal mass measuring up to 11.4 cm with dilated cystic tubular component.  Complex left adnexal mass measuring up to 6.4 cm with multiple cystic  components.  These findings are compatible with bilateral tubo-ovarian abscesses.   Electronically Signed   By: Charline Bills M.D.   On: 05/14/2015 10:01   Ct Abdomen Pelvis W Contrast  05/13/2015   CLINICAL DATA:  Fever for 10 days. Abnormal pelvic exam. Pelvic mass.  EXAM: CT ABDOMEN AND PELVIS WITH CONTRAST  TECHNIQUE: Multidetector CT imaging of the abdomen and pelvis was performed using the standard protocol following bolus administration of intravenous contrast.  CONTRAST:  OMNIPAQUE IOHEXOL 300 MG/ML  SOLN  COMPARISON:  04/27/2015  FINDINGS: Musculoskeletal:  No aggressive osseous lesions.  Lung Bases: Clear.  Liver:  Normal.  Spleen:  Normal.  Gallbladder:  Normal.  Common bile duct:  Normal.  Pancreas:  Normal.  Adrenal glands:  Normal.  Kidneys: Normal enhancement. LEFT ureter appears normal. RIGHT ureter appears normal.  Stomach:  Normal.  Small bowel:  Normal.  No obstruction or mesenteric adenopathy.  Colon: Normal retrocecal appendix identified. Ascending colon, transverse, descending and sigmoid colon are within normal limits. There is fat stranding extending to the rectum from pelvic mass.  Pelvic Genitourinary: The urinary bladder is partially collapsed. IUD present within the uterus. There is a complex cluster of cystic lesions with peripheral enhancement in the anatomic pelvis on both sides of the uterus, compatible with tubo-ovarian abscess. Surrounding inflammatory changes are present. In aggregate, the pelvic mass measures 9 cm transverse, 6.8 cm craniocaudal and at least 7.3 cm AP.  Peritoneum: No free air.  Vascular/lymphatic: No acute vascular abnormality. Small retroperitoneal lymph nodes are likely reactive to pelvic process.  Body Wall: Normal.  IMPRESSION: 1. Complex multilocular inflammatory mass in the anatomic pelvis compatible with tubo-ovarian abscess. 2. IUD present in the uterus which is displaced inferiorly by the mass. 3. These results will be called to the ordering  clinician or representative by the Radiologist Assistant, and communication documented in the PACS or zVision Dashboard.   Electronically Signed   By: Andreas Newport M.D.   On: 05/13/2015 16:50   Ct Image Guided Drainage By Percutaneous Catheter  05/17/2015   CLINICAL DATA:  Multilocular bilateral tubo-ovarian abscess on recent CT. Percutaneous drainage is requested.  EXAM: CT GUIDED DRAINAGE OF LEFT TUBO-OVARIAN ABSCESS  CT GUIDED ASPIRATION (ATTEMPTED)  ANESTHESIA/SEDATION: Intravenous Fentanyl and Versed were administered as conscious sedation during continuous cardiorespiratory monitoring by the radiology RN, with a total moderate sedation time of 40 minutes.  PROCEDURE: The procedure, risks, benefits, and alternatives were explained to the patient. Questions regarding the procedure were encouraged and answered. The patient understands and consents to the procedure.  Patient placed prone. Select axial scans through the pelvis were obtained an the pelvic collections seen on previous CT were localized, and skin entry sites marked.  The operative field was prepped with chlorhexidinein a sterile fashion, and a sterile drape was applied covering the operative field. A sterile gown and sterile gloves were used for the procedure. Local anesthesia was provided with 1% Lidocaine.  Under CT fluoroscopic guidance, an 18 gauge trocar needle was advanced into the dominant left pelvic collection. Purulent fluid could be aspirated. An Amplatz guidewire advanced easily within the collection, confirmed on CT. Tract dilated to facilitate placement of a 12  French pigtail catheter, placed centrally within the collection, position confirmed on CT. In similar fashion, from a right-sided approach an 18 gauge trocar needs what was advanced towards the smaller right pelvic collections, only vaguely visualized on this noncontrast study. No significant fluid could be aspirated on multiple passes.  The left drain catheter was secured  externally with 0 Prolene suture and StatLock and placed to gravity bag. Approximately 25 mL of somewhat bloody purulent material were aspirated, a sample sent for Gram stain and culture. The patient tolerated the procedure well.  COMPLICATIONS: None immediate  FINDINGS: Low-attenuation bilateral pelvic collections were noted on localizing CT. 12 French pigtail catheter placed in the dominant left collection. Aspiration of right-sided collections returned no significant fluid. Follow-up scans reveal no hemorrhage or other apparent complication .  IMPRESSION: 1. Technically successful CT-guided drain catheter placement into dominant left pelvic collection. A sample of the aspirate was sent for Gram stain and culture. 2. Attempted aspiration of smaller right-sided collections returned no significant fluid.   Electronically Signed   By: Corlis Leak M.D.   On: 05/17/2015 16:38    Assessment & Plan:  Pt for f/u of B TOAs clinically improved.  Reviewed with pt increased risk of EC pregnancy and also infertility due to tubal blockage given h/o TOAs.  She may want an HSG at a later date. Contraception counseling.  All questions answered.   Pelvic sono first week in Oct Complete last day of atbx F/u in 6 weeks or sooner prn Depo Provera 150mg  IM  Today Condom use  Rondarius Kadrmas L. Harraway-Smith, M.D., [place] [place]

## 2015-06-04 ENCOUNTER — Encounter: Payer: Self-pay | Admitting: Obstetrics & Gynecology

## 2015-06-22 ENCOUNTER — Other Ambulatory Visit: Payer: Self-pay | Admitting: Obstetrics & Gynecology

## 2015-06-22 DIAGNOSIS — N7093 Salpingitis and oophoritis, unspecified: Secondary | ICD-10-CM

## 2015-06-23 ENCOUNTER — Ambulatory Visit (HOSPITAL_BASED_OUTPATIENT_CLINIC_OR_DEPARTMENT_OTHER): Payer: 59

## 2015-07-02 ENCOUNTER — Ambulatory Visit (HOSPITAL_BASED_OUTPATIENT_CLINIC_OR_DEPARTMENT_OTHER)
Admission: RE | Admit: 2015-07-02 | Discharge: 2015-07-02 | Disposition: A | Discharge status: Home / Self Care | Payer: 59 | Source: Ambulatory Visit | Attending: Obstetrics & Gynecology | Admitting: Obstetrics & Gynecology

## 2015-07-02 DIAGNOSIS — N7093 Salpingitis and oophoritis, unspecified: Secondary | ICD-10-CM | POA: Insufficient documentation

## 2015-07-08 ENCOUNTER — Encounter: Payer: Self-pay | Admitting: Internal Medicine

## 2015-07-08 ENCOUNTER — Ambulatory Visit (INDEPENDENT_AMBULATORY_CARE_PROVIDER_SITE_OTHER): Payer: 59 | Admitting: Internal Medicine

## 2015-07-08 VITALS — BP 122/88 | Temp 98.5°F | Wt 182.9 lb

## 2015-07-08 DIAGNOSIS — D509 Iron deficiency anemia, unspecified: Secondary | ICD-10-CM | POA: Diagnosis not present

## 2015-07-08 DIAGNOSIS — Z23 Encounter for immunization: Secondary | ICD-10-CM | POA: Diagnosis not present

## 2015-07-08 LAB — POCT HEMOGLOBIN: Hemoglobin: 9.6 g/dL — AB (ref 12.2–16.2)

## 2015-07-08 NOTE — Patient Instructions (Signed)
Continue iron  .     Plan fu repeat  Cbc in 3-4 months.

## 2015-07-08 NOTE — Progress Notes (Signed)
Pre visit review using our clinic review tool, if applicable. No additional management support is needed unless otherwise documented below in the visit note.  Chief Complaint  Patient presents with  . Follow-up    HPI: [name] 29 y.o.  Comes i after fu rx and drainage TOA resolved but  Ongoing  anemia that needed fu . Iron at night with vit C  Kale and  No bleeding bruising   function normal . Now on depo  Since  August .  Feels better   Last us nl .   ROS: See pertinent positives and negatives per HPI.  Past Medical History  Diagnosis Date  . Anemia, iron deficiency     With elevated hemoglobin A2  . Chicken pox   . Egg donor     Family History  Problem Relation Age of Onset  . Hyperlipidemia Mother   . Miscarriages / [place] Mother     Mother has 1 miscarriage  . Cancer Father     Lung Cancer  . Learning disabilities Sister     Sister has Cerebral Palsy and Epilepsy  . Mental retardation Sister   . Cancer Maternal Grandfather     Liver Cancer    Social History   Social History  . Marital Status: Single    Spouse Name: N/A  . Number of Children: N/A  . Years of Education: N/A   Social History Main Topics  . Smoking status: Never Smoker   . Smokeless tobacco: Never Used  . Alcohol Use: No  . Drug Use: No  . Sexual Activity: Yes    Birth Control/ Protection: Injection   Other Topics Concern  . None   Social History Narrative   hhof 2 and 2  plus dog   Working  [employer]   [employer]   [employer]  Senior  [employer] .  6 hours   40 work  And 12 credits .[employer]. Done dec 16    No ets. Tobacco   caffiene  Once coke zero.   2-3    ETOH:  Wine weekends.    Gym  4 x per week.     Working on knee  Problems  Has seen SM .  wendover  ocass aleve.           Outpatient Prescriptions Prior to Visit  Medication Sig Dispense Refill  . acetaminophen (TYLENOL) 500 MG tablet Take 1,000 mg by mouth every 6 (six) hours as needed for fever.    . fexofenadine (ALLEGRA) 180 MG  tablet Take 180 mg by mouth daily as needed for allergies.     . flunisolide (NASALIDE) 25 MCG/ACT (0.025%) SOLN Place 2 sprays into the nose 2 (two) times daily. 1 Bottle 3  . IRON PO Take 3 tablets by mouth at bedtime.    . Polyethylene Glycol 3350 (MIRALAX PO) Take 1 Dose by mouth daily as needed (constipation).    . Simethicone (GAS-X EXTRA STRENGTH PO) Take 1 tablet by mouth every 4 (four) hours as needed (gas).    . SUMAtriptan (IMITREX) 100 MG tablet 1 po at onset  Of migraine.Can repeat in 2 hours if needed (Patient taking differently: Take 100 mg by mouth every 2 (two) hours as needed for migraine. 1 po at onset  Of migraine.Can repeat in 2 hours if needed) 9 tablet 2  . vitamin C (ASCORBIC ACID) 500 MG tablet Take 1,000 mg by mouth daily.    Marland Kitchen. amoxicillin-clavulanate (AUGMENTIN) 875-125 MG per tablet  Take 1 tablet by mouth 2 (two) times daily. 28 tablet 0  . HYDROcodone-acetaminophen (NORCO/VICODIN) 5-325 MG per tablet Take 1-2 tablets by mouth every 6 (six) hours as needed for moderate pain. (Patient not taking: Reported on 06/02/2015) 28 tablet 0   Facility-Administered Medications Prior to Visit  Medication Dose Route Frequency Provider Last Rate Last Dose  . medroxyPROGESTERone (DEPO-PROVERA) injection 150 mg  150 mg Intramuscular Q90 days Willodean Rosenthal, MD   150 mg at 06/02/15 1642     EXAM:  BP 122/88 mmHg  Temp(Src) 98.5 F (36.9 C) (Oral)  Wt 182 lb 14.4 oz (82.963 kg)  LMP 05/15/2015 (Approximate)  Body mass index is 34.58 kg/(m^2).  GENERAL: vitals reviewed and listed above, alert, oriented, appears well hydrated and in no acute distress HEENT: atraumatic, conjunctiva  clear, no obvious abnormalities on inspection of external nose and ears  NECK: no obvious masses on inspection palpation  LUNGS: clear to auscultation bilaterally, no wheezes, rales or rhonchi, good air movement CV: HRRR, no clubbing cyanosis or  peripheral edema nl cap refill  abd no focal  tenderness or  Mass  MS: moves all extremities without noticeable focal  abnormality PSYCH: pleasant and cooperative, no obvious depression or anxiety Lab Results  Component Value Date   WBC 8.8 05/13/2015   HGB 9.6* 07/08/2015   HCT 26.3* 05/13/2015   PLT 482* 05/13/2015   GLUCOSE 103* 05/13/2015   CHOL 157 08/11/2014   TRIG 175.0* 08/11/2014   HDL 35.80* 08/11/2014   LDLCALC 86 08/11/2014   ALT 13* 05/13/2015   AST 16 05/13/2015   NA 134* 05/13/2015   K 3.4* 05/13/2015   CL 99* 05/13/2015   CREATININE 0.73 05/17/2015   BUN <5* 05/13/2015   CO2 26 05/13/2015   TSH 2.04 04/27/2015   INR 1.17 05/17/2015    ASSESSMENT AND PLAN:  Discussed the following assessment and plan:  Anemia, iron deficiency - Plan: POC Hemoglobin  Anemia, unspecified anemia type  Need for prophylactic vaccination and inoculation against influenza - Plan: Flu Vaccine QUAD 36+ mos PF IM (Fluarix & Fluzone Quad PF)  -Patient advised to return or notify health care team  if symptoms worsen ,persist or new concerns arise.  Patient Instructions  Continue iron  .     Plan fu repeat  Cbc in 3-4 months.      Neta Mends. Tanyika Barros M.D.

## 2015-07-14 ENCOUNTER — Ambulatory Visit (INDEPENDENT_AMBULATORY_CARE_PROVIDER_SITE_OTHER): Payer: 59 | Admitting: Obstetrics & Gynecology

## 2015-07-14 ENCOUNTER — Encounter: Payer: Self-pay | Admitting: Obstetrics & Gynecology

## 2015-07-14 VITALS — Ht 61.0 in | Wt 183.0 lb

## 2015-07-14 DIAGNOSIS — N7093 Salpingitis and oophoritis, unspecified: Secondary | ICD-10-CM

## 2015-07-14 NOTE — Progress Notes (Signed)
   Subjective:    Patient ID: [name], female    DOB: 11/18/1985, 29 y.o.   MRN: [id number]  HPI 29 yo S AA G0 is here for follow up after her u/s to follow the progress of her TOA. It was drained by IR last month. She is doing well.   Review of Systems Uses depo provera for contraception She has had her flu vaccine this year.    Objective:   Physical Exam  WNWHBFNAD Breathing, conversing, and ambulating normally abd- obese, benign Her u/s is completely normal      Assessment & Plan:  H/O TOA- resolved. Rec HSG prior to attempt to conceive Annual next year

## 2015-08-30 ENCOUNTER — Other Ambulatory Visit: Payer: 59

## 2015-08-30 ENCOUNTER — Other Ambulatory Visit (INDEPENDENT_AMBULATORY_CARE_PROVIDER_SITE_OTHER): Payer: 59

## 2015-08-30 DIAGNOSIS — Z0000 Encounter for general adult medical examination without abnormal findings: Secondary | ICD-10-CM

## 2015-08-30 LAB — LIPID PANEL
CHOLESTEROL: 144 mg/dL (ref 0–200)
HDL: 27.2 mg/dL — ABNORMAL LOW (ref >39.00)
LDL Cholesterol: 92 mg/dL (ref 0–99)
NonHDL: 116.81
TRIGLYCERIDES: 122 mg/dL (ref 0.0–149.0)
Total CHOL/HDL Ratio: 5
VLDL: 24.4 mg/dL (ref 0.0–40.0)

## 2015-08-30 LAB — CBC WITH DIFFERENTIAL/PLATELET
BASOS PCT: 0.7 % (ref 0.0–3.0)
Basophils Absolute: 0 10*3/uL (ref 0.0–0.1)
EOS PCT: 3.6 % (ref 0.0–5.0)
Eosinophils Absolute: 0.2 10*3/uL (ref 0.0–0.7)
HCT: 34.1 % — ABNORMAL LOW (ref 36.0–46.0)
Hemoglobin: 11 g/dL — ABNORMAL LOW (ref 12.0–15.0)
LYMPHS ABS: 2.2 10*3/uL (ref 0.7–4.0)
Lymphocytes Relative: 46.8 % — ABNORMAL HIGH (ref 12.0–46.0)
MCHC: 32.2 g/dL (ref 30.0–36.0)
MCV: 73.9 fl — AB (ref 78.0–100.0)
MONOS PCT: 8.6 % (ref 3.0–12.0)
Monocytes Absolute: 0.4 10*3/uL (ref 0.1–1.0)
NEUTROS ABS: 1.9 10*3/uL (ref 1.4–7.7)
NEUTROS PCT: 40.3 % — AB (ref 43.0–77.0)
RBC: 4.61 Mil/uL (ref 3.87–5.11)
RDW: 17.6 % — ABNORMAL HIGH (ref 11.5–15.5)
WBC: 4.7 10*3/uL (ref 4.0–10.5)

## 2015-08-30 LAB — BASIC METABOLIC PANEL
BUN: 9 mg/dL (ref 6–23)
CO2: 25 meq/L (ref 19–32)
Calcium: 9.1 mg/dL (ref 8.4–10.5)
Chloride: 105 mEq/L (ref 96–112)
Creatinine, Ser: 0.76 mg/dL (ref 0.40–1.20)
GFR: 94.29 mL/min (ref >60.00)
GLUCOSE: 100 mg/dL — AB (ref 70–99)
POTASSIUM: 4.3 meq/L (ref 3.5–5.1)
SODIUM: 137 meq/L (ref 135–145)

## 2015-08-30 LAB — FERRITIN: Ferritin: 7.8 ng/mL — ABNORMAL LOW (ref 10.0–291.0)

## 2015-08-30 LAB — HEPATIC FUNCTION PANEL
ALBUMIN: 3.8 g/dL (ref 3.5–5.2)
ALT: 21 U/L (ref 0–35)
AST: 17 U/L (ref 0–37)
Alkaline Phosphatase: 76 U/L (ref 39–117)
Bilirubin, Direct: 0 mg/dL (ref 0.0–0.3)
TOTAL PROTEIN: 7.4 g/dL (ref 6.0–8.3)
Total Bilirubin: 0.2 mg/dL (ref 0.2–1.2)

## 2015-08-30 LAB — TSH: TSH: 2.43 u[IU]/mL (ref 0.35–4.50)

## 2015-08-31 LAB — HIV ANTIBODY (ROUTINE TESTING W REFLEX): HIV: NONREACTIVE

## 2015-09-01 ENCOUNTER — Ambulatory Visit (INDEPENDENT_AMBULATORY_CARE_PROVIDER_SITE_OTHER): Payer: 59

## 2015-09-01 DIAGNOSIS — Z3042 Encounter for surveillance of injectable contraceptive: Secondary | ICD-10-CM

## 2015-09-01 NOTE — Progress Notes (Signed)
Patient presents for depo Provera injection. Injection given.  Medication supplied by clinic.   Patient states she is doing well with medication but did have one episode of two day of spotting in Oct. Patient made aware that this can happen but will probably decrease the more injections she has over time. Patient states understanding and will return to clinic in 3 months for next injection. Armandina StammerJennifer Marven Veley RN BSN

## 2015-09-06 ENCOUNTER — Ambulatory Visit (INDEPENDENT_AMBULATORY_CARE_PROVIDER_SITE_OTHER): Payer: 59 | Admitting: Internal Medicine

## 2015-09-06 ENCOUNTER — Encounter: Payer: Self-pay | Admitting: Internal Medicine

## 2015-09-06 VITALS — BP 120/82 | Temp 98.5°F | Ht 62.0 in | Wt 189.7 lb

## 2015-09-06 DIAGNOSIS — D509 Iron deficiency anemia, unspecified: Secondary | ICD-10-CM | POA: Diagnosis not present

## 2015-09-06 DIAGNOSIS — E786 Lipoprotein deficiency: Secondary | ICD-10-CM | POA: Diagnosis not present

## 2015-09-06 DIAGNOSIS — Z0000 Encounter for general adult medical examination without abnormal findings: Secondary | ICD-10-CM

## 2015-09-06 NOTE — Patient Instructions (Addendum)
Continue  Iron as tolerated  Plan recheck cbcdiff and ferritin in  6 months and then ROV  Avoid sugar beverages  Try off for a month  And then limited . As discussed Your low HDL and bporderline sugar shows you may have risk to develop diabetes some day .  Exercise healthy eating and some weight loss can prevent future  Problems   No matter what your heredity .    Maintenance, Female Adopting a healthy lifestyle and getting preventive care can go a long way to promote health and wellness. Talk with your health care provider about what schedule of regular examinations is right for you. This is a good chance for you to check in with your provider about disease prevention and staying healthy. In between checkups, there are plenty of things you can do on your own. Experts have done a lot of research about which lifestyle changes and preventive measures are most likely to keep you healthy. Ask your health care provider for more information. WEIGHT AND DIET  Eat a healthy diet  Be sure to include plenty of vegetables, fruits, low-fat dairy products, and lean protein.  Do not eat a lot of foods high in solid fats, added sugars, or salt.  Get regular exercise. This is one of the most important things you can do for your health.  Most adults should exercise for at least 150 minutes each week. The exercise should increase your heart rate and make you sweat (moderate-intensity exercise).  Most adults should also do strengthening exercises at least twice a week. This is in addition to the moderate-intensity exercise.  Maintain a healthy weight  Body mass index (BMI) is a measurement that can be used to identify possible weight problems. It estimates body fat based on height and weight. Your health care provider can help determine your BMI and help you achieve or maintain a healthy weight.  For females 72 years of age and older:   A BMI below 18.5 is considered underweight.  A BMI of 18.5 to  24.9 is normal.  A BMI of 25 to 29.9 is considered overweight.  A BMI of 30 and above is considered obese.  Watch levels of cholesterol and blood lipids  You should start having your blood tested for lipids and cholesterol at 31 years of age, then have this test every 5 years.  You may need to have your cholesterol levels checked more often if:  Your lipid or cholesterol levels are high.  You are older than 31 years of age.  You are at high risk for heart disease.  CANCER SCREENING   Lung Cancer  Lung cancer screening is recommended for adults 72-84 years old who are at high risk for lung cancer because of a history of smoking.  A yearly low-dose CT scan of the lungs is recommended for people who:  Currently smoke.  Have quit within the past 15 years.  Have at least a 30-pack-year history of smoking. A pack year is smoking an average of one pack of cigarettes a day for 1 year.  Yearly screening should continue until it has been 15 years since you quit.  Yearly screening should stop if you develop a health problem that would prevent you from having lung cancer treatment.  Breast Cancer  Practice breast self-awareness. This means understanding how your breasts normally appear and feel.  It also means doing regular breast self-exams. Let your health care provider know about any changes, no matter how small.  If you are in your 20s or 30s, you should have a clinical breast exam (CBE) by a health care provider every 1-3 years as part of a regular health exam.  If you are 51 or older, have a CBE every year. Also consider having a breast X-ray (mammogram) every year.  If you have a family history of breast cancer, talk to your health care provider about genetic screening.  If you are at high risk for breast cancer, talk to your health care provider about having an MRI and a mammogram every year.  Breast cancer gene (BRCA) assessment is recommended for women who have family  members with BRCA-related cancers. BRCA-related cancers include:  Breast.  Ovarian.  Tubal.  Peritoneal cancers.  Results of the assessment will determine the need for genetic counseling and BRCA1 and BRCA2 testing. Cervical Cancer Your health care provider may recommend that you be screened regularly for cancer of the pelvic organs (ovaries, uterus, and vagina). This screening involves a pelvic examination, including checking for microscopic changes to the surface of your cervix (Pap test). You may be encouraged to have this screening done every 3 years, beginning at age 59.  For women ages 64-65, health care providers may recommend pelvic exams and Pap testing every 3 years, or they may recommend the Pap and pelvic exam, combined with testing for human papilloma virus (HPV), every 5 years. Some types of HPV increase your risk of cervical cancer. Testing for HPV may also be done on women of any age with unclear Pap test results.  Other health care providers may not recommend any screening for nonpregnant women who are considered low risk for pelvic cancer and who do not have symptoms. Ask your health care provider if a screening pelvic exam is right for you.  If you have had past treatment for cervical cancer or a condition that could lead to cancer, you need Pap tests and screening for cancer for at least 20 years after your treatment. If Pap tests have been discontinued, your risk factors (such as having a new sexual partner) need to be reassessed to determine if screening should resume. Some women have medical problems that increase the chance of getting cervical cancer. In these cases, your health care provider may recommend more frequent screening and Pap tests. Colorectal Cancer  This type of cancer can be detected and often prevented.  Routine colorectal cancer screening usually begins at 31 years of age and continues through 31 years of age.  Your health care provider may recommend  screening at an earlier age if you have risk factors for colon cancer.  Your health care provider may also recommend using home test kits to check for hidden blood in the stool.  A small camera at the end of a tube can be used to examine your colon directly (sigmoidoscopy or colonoscopy). This is done to check for the earliest forms of colorectal cancer.  Routine screening usually begins at age 62.  Direct examination of the colon should be repeated every 5-10 years through 31 years of age. However, you may need to be screened more often if early forms of precancerous polyps or small growths are found. Skin Cancer  Check your skin from head to toe regularly.  Tell your health care provider about any new moles or changes in moles, especially if there is a change in a mole's shape or color.  Also tell your health care provider if you have a mole that is larger than the  size of a pencil eraser.  Always use sunscreen. Apply sunscreen liberally and repeatedly throughout the day.  Protect yourself by wearing long sleeves, pants, a wide-brimmed hat, and sunglasses whenever you are outside. HEART DISEASE, DIABETES, AND HIGH BLOOD PRESSURE   High blood pressure causes heart disease and increases the risk of stroke. High blood pressure is more likely to develop in:  People who have blood pressure in the high end of the normal range (130-139/85-89 mm Hg).  People who are overweight or obese.  People who are African American.  If you are 26-24 years of age, have your blood pressure checked every 3-5 years. If you are 32 years of age or older, have your blood pressure checked every year. You should have your blood pressure measured twice--once when you are at a hospital or clinic, and once when you are not at a hospital or clinic. Record the average of the two measurements. To check your blood pressure when you are not at a hospital or clinic, you can use:  An automated blood pressure machine at a  pharmacy.  A home blood pressure monitor.  If you are between 18 years and 63 years old, ask your health care provider if you should take aspirin to prevent strokes.  Have regular diabetes screenings. This involves taking a blood sample to check your fasting blood sugar level.  If you are at a normal weight and have a low risk for diabetes, have this test once every three years after 31 years of age.  If you are overweight and have a high risk for diabetes, consider being tested at a younger age or more often. PREVENTING INFECTION  Hepatitis B  If you have a higher risk for hepatitis B, you should be screened for this virus. You are considered at high risk for hepatitis B if:  You were born in a country where hepatitis B is common. Ask your health care provider which countries are considered high risk.  Your parents were born in a high-risk country, and you have not been immunized against hepatitis B (hepatitis B vaccine).  You have HIV or AIDS.  You use needles to inject street drugs.  You live with someone who has hepatitis B.  You have had sex with someone who has hepatitis B.  You get hemodialysis treatment.  You take certain medicines for conditions, including cancer, organ transplantation, and autoimmune conditions. Hepatitis C  Blood testing is recommended for:  Everyone born from [birth year] through 1967.  Anyone with known risk factors for hepatitis C. Sexually transmitted infections (STIs)  You should be screened for sexually transmitted infections (STIs) including gonorrhea and chlamydia if:  You are sexually active and are younger than 31 years of age.  You are older than 31 years of age and your health care provider tells you that you are at risk for this type of infection.  Your sexual activity has changed since you were last screened and you are at an increased risk for chlamydia or gonorrhea. Ask your health care provider if you are at risk.  If you do not  have HIV, but are at risk, it may be recommended that you take a prescription medicine daily to prevent HIV infection. This is called pre-exposure prophylaxis (PrEP). You are considered at risk if:  You are sexually active and do not regularly use condoms or know the HIV status of your partner(s).  You take drugs by injection.  You are sexually active with a partner who  has HIV. Talk with your health care provider about whether you are at high risk of being infected with HIV. If you choose to begin PrEP, you should first be tested for HIV. You should then be tested every 3 months for as long as you are taking PrEP.  PREGNANCY   If you are premenopausal and you may become pregnant, ask your health care provider about preconception counseling.  If you may become pregnant, take 400 to 800 micrograms (mcg) of folic acid every day.  If you want to prevent pregnancy, talk to your health care provider about birth control (contraception). OSTEOPOROSIS AND MENOPAUSE   Osteoporosis is a disease in which the bones lose minerals and strength with aging. This can result in serious bone fractures. Your risk for osteoporosis can be identified using a bone density scan.  If you are 34 years of age or older, or if you are at risk for osteoporosis and fractures, ask your health care provider if you should be screened.  Ask your health care provider whether you should take a calcium or vitamin D supplement to lower your risk for osteoporosis.  Menopause may have certain physical symptoms and risks.  Hormone replacement therapy may reduce some of these symptoms and risks. Talk to your health care provider about whether hormone replacement therapy is right for you.  HOME CARE INSTRUCTIONS   Schedule regular health, dental, and eye exams.  Stay current with your immunizations.   Do not use any tobacco products including cigarettes, chewing tobacco, or electronic cigarettes.  If you are pregnant, do not  drink alcohol.  If you are breastfeeding, limit how much and how often you drink alcohol.  Limit alcohol intake to no more than 1 drink per day for nonpregnant women. One drink equals 12 ounces of beer, 5 ounces of wine, or 1 ounces of hard liquor.  Do not use street drugs.  Do not share needles.  Ask your health care provider for help if you need support or information about quitting drugs.  Tell your health care provider if you often feel depressed.  Tell your health care provider if you have ever been abused or do not feel safe at home.   This information is not intended to replace advice given to you by your health care provider. Make sure you discuss any questions you have with your health care provider.   Document Released: 03/27/2011 Document Revised: 10/02/2014 Document Reviewed: 08/13/2013 Elsevier Interactive Patient Education 2016 [employer].  High Cholesterol High cholesterol refers to having a high level of cholesterol in your blood. Cholesterol is a white, waxy, fat-like protein that your body needs in small amounts. Your liver makes all the cholesterol you need. Excess cholesterol comes from the food you eat. Cholesterol travels in your bloodstream through your blood vessels. If you have high cholesterol, deposits (plaque) may build up on the walls of your blood vessels. This makes the arteries narrower and stiffer. Plaque increases your risk of heart attack and stroke. Work with your health care provider to keep your cholesterol levels in a healthy range. RISK FACTORS Several things can make you more likely to have high cholesterol. These include:   Eating foods high in animal fat (saturated fat) or cholesterol.  Being overweight.  Not getting enough exercise.  Having a family history of high cholesterol. SIGNS AND SYMPTOMS High cholesterol does not cause symptoms. DIAGNOSIS  Your health care provider can do a blood test to check whether you have high  cholesterol. If you are older than 20, your health care provider may check your cholesterol every 4-6 years. You may be checked more often if you already have high cholesterol or other risk factors for heart disease. The blood test for cholesterol measures the following:  Bad cholesterol (LDL cholesterol). This is the type of cholesterol that causes heart disease. This number should be less than 100.  Good cholesterol (HDL cholesterol). This type helps protect against heart disease. A healthy level of HDL cholesterol is 60 or higher.  Total cholesterol. This is the combined number of LDL cholesterol and HDL cholesterol. A healthy number is less than 200. TREATMENT  High cholesterol can be treated with diet changes, lifestyle changes, and medicine.   Diet changes may include eating more whole grains, fruits, vegetables, nuts, and fish. You may also have to cut back on red meat and foods with a lot of added sugar.  Lifestyle changes may include getting at least 40 minutes of aerobic exercise three times a week. Aerobic exercises include walking, biking, and swimming. Aerobic exercise along with a healthy diet can help you maintain a healthy weight. Lifestyle changes may also include quitting smoking.  If diet and lifestyle changes are not enough to lower your cholesterol, your health care provider may prescribe a statin medicine. This medicine has been shown to lower cholesterol and also lower the risk of heart disease. HOME CARE INSTRUCTIONS  Only take over-the-counter or prescription medicines as directed by your health care provider.   Follow a healthy diet as directed by your health care provider. For instance:   Eat chicken (without skin), fish, veal, shellfish, ground [place] breast, and round or loin cuts of red meat.  Do not eat fried foods and fatty meats, such as hot dogs and salami.   Eat plenty of fruits, such as apples.   Eat plenty of vegetables, such as broccoli, potatoes,  and carrots.   Eat beans, peas, and lentils.   Eat grains, such as barley, rice, couscous, and bulgur wheat.   Eat pasta without cream sauces.   Use skim or nonfat milk and low-fat or nonfat yogurt and cheeses. Do not eat or drink whole milk, cream, ice cream, egg yolks, and hard cheeses.   Do not eat stick margarine or tub margarines that contain trans fats (also called partially hydrogenated oils).   Do not eat cakes, cookies, crackers, or other baked goods that contain trans fats.   Do not eat saturated tropical oils, such as coconut and palm oil.   Exercise as directed by your health care provider. Increase your activity level with activities such as gardening or walking.   Keep all follow-up appointments.  SEEK MEDICAL CARE IF:  You are struggling to maintain a healthy diet or weight.  You need help starting an exercise program.  You need help to stop smoking. SEEK IMMEDIATE MEDICAL CARE IF:  You have chest pain.  You have trouble breathing.   This information is not intended to replace advice given to you by your health care provider. Make sure you discuss any questions you have with your health care provider.   Document Released: 09/11/2005 Document Revised: 10/02/2014 Document Reviewed: 07/04/2013 Elsevier Interactive Patient Education [employer].

## 2015-09-06 NOTE — Progress Notes (Signed)
Pre visit review using our clinic review tool, if applicable. No additional management support is needed unless otherwise documented below in the visit note.  Chief Complaint  Patient presents with  . Annual Exam    HPI: Patient  [name]  29 y.o. comes in today for [place] visit  Had had all cear from [facility] after rx  Tubo ovarian abscesses  No bleeding taking iron 3 per day with vit d  No bleeding now on depo[prover since October   Health Maintenance  Topic Date Due  . PAP SMEAR  11/07/2015  . INFLUENZA VACCINE  04/25/2016  . TETANUS/TDAP  11/06/2022  . 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 GI /GU: No adominal pain, vomiting, change in bowel habits. No blood in the stool. No significant GU symptoms. SKIN/HEME: ,no acute skin rashes suspicious lesions or bleeding. No lymphadenopathy, nodules, masses.  NEURO/ PSYCH:  No neurologic signs such as weakness numbness. No depression anxiety. IMM/ Allergy: No unusual infections.  Allergy .   REST of 12 system review negative except as per HPI   Past Medical History  Diagnosis Date  . Anemia, iron deficiency     With elevated hemoglobin A2  . Chicken pox   . Egg donor     Past Surgical History  Procedure Laterality Date  . Denies surgical hx    . Tuboovarian abcess  04-2015    Family History  Problem Relation Age of Onset  . Hyperlipidemia Mother   . Miscarriages / [place] Mother     Mother has 1 miscarriage  . Cancer Father     Lung Cancer  . Learning disabilities Sister     Sister  has Cerebral Palsy and Epilepsy  . Mental retardation Sister   . Cancer Maternal Grandfather     Liver Cancer    Social History   Social History  . Marital Status: Single    Spouse Name: N/A  . Number of Children: N/A  . Years of Education: N/A   Social History Main Topics  . Smoking status: Never Smoker   . Smokeless tobacco: Never Used  . Alcohol Use: No  . Drug Use: No  . Sexual Activity: Yes    Birth Control/ Protection: Injection   Other Topics Concern  . None   Social History Narrative   hhof 2 and 2  plus dog   Working  Dispensing optician.   School [employer]  Sen[employer] .  6 hours   40 work  And 12 credits .Marland Kitchen Done dec 16    No ets. Tobacco   caffiene  Once coke zero.   2-3    ETOH:  Wine weekends.    WALKING .     Working on knee  Problems  Has seen SM .  wendover  ocass aleve.           Outpatient Prescriptions Prior to Visit  Medication Sig Dispense Refill  . acetaminophen (TYLENOL) 500 MG tablet Take 1,000 mg by mouth every 6 (six) hours as needed for fever.    Marland Kitchen  fexofenadine (ALLEGRA) 180 MG tablet Take 180 mg by mouth daily as needed for allergies.     . flunisolide (NASALIDE) 25 MCG/ACT (0.025%) SOLN Place 2 sprays into the nose 2 (two) times daily. 1 Bottle 3  . IRON PO Take 3 tablets by mouth at bedtime.    . Polyethylene Glycol 3350 (MIRALAX PO) Take 1 Dose by mouth daily as needed (constipation).    . Simethicone (GAS-X EXTRA STRENGTH PO) Take 1 tablet by mouth every 4 (four) hours as needed (gas).    . SUMAtriptan (IMITREX) 100 MG tablet 1 po at onset  Of migraine.Can repeat in 2 hours if needed (Patient taking differently: Take 100 mg by mouth every 2 (two) hours as needed for migraine. 1 po at onset  Of migraine.Can repeat in 2 hours if needed) 9 tablet 2  . vitamin C (ASCORBIC ACID) 500 MG tablet Take 1,000 mg by mouth daily.     Facility-Administered Medications Prior to Visit  Medication Dose Route Frequency Provider Last Rate Last Dose  .  medroxyPROGESTERone (DEPO-PROVERA) injection 150 mg  150 mg Intramuscular Q90 days Lavonia Drafts, MD   150 mg at 09/01/15 0901     EXAM:  BP 120/82 mmHg  Temp(Src) 98.5 F (36.9 C) (Oral)  Ht '5\' 2"'  (1.575 m)  Wt 189 lb 11.2 oz (86.047 kg)  BMI 34.69 kg/m2  Body mass index is 34.69 kg/(m^2).  Physical Exam: Vital signs reviewed MBT:DHRC is a well-developed well-nourished alert cooperative    who appearsr stated age in no acute distress.  HEENT: normocephalic atraumatic , Eyes: PERRL EOM's full, conjunctiva clear, Nares: paten,t no deformity discharge or tenderness., Ears: no deformity EAC's clear TMs with normal landmarks. Mouth: clear OP, no lesions, edema.  Moist mucous membranes. Dentition in adequate repair. NECK: supple without masses, thyromegaly or bruits. CHEST/PULM:  Clear to auscultation and percussion breath sounds equal no wheeze , rales or rhonchi. No chest wall deformities or tenderness.Breast: normal by inspection . No dimpling, discharge, masses, tenderness or discharge . CV: PMI is nondisplaced, S1 S2 no gallops, murmurs, rubs. Peripheral pulses are full without delay.No JVD .  ABDOMEN: Bowel sounds normal nontender  No guard or rebound, no hepato splenomegal no CVA tenderness.  No hernia. Extremtities:  No clubbing cyanosis or edema, no acute joint swelling or redness no focal atrophy NEURO:  Oriented x3, cranial nerves 3-12 appear to be intact, no obvious focal weakness,gait within normal limits no abnormal reflexes or asymmetrical SKIN: No acute rashes normal turgor, color, no bruising or petechiae. PSYCH: Oriented, good eye contact, no obvious depression anxiety, cognition and judgment appear normal. LN: no cervical axillary inguinal adenopathy  Lab Results  Component Value Date   WBC 4.7 08/30/2015   HGB 11.0* 08/30/2015   HCT 34.1* 08/30/2015   PLT 254.0 Large Platelets seen on smear. 08/30/2015   GLUCOSE 100* 08/30/2015   CHOL 144 08/30/2015   TRIG  122.0 08/30/2015   HDL 27.20* 08/30/2015   LDLCALC 92 08/30/2015   ALT 21 08/30/2015   AST 17 08/30/2015   NA 137 08/30/2015   K 4.3 08/30/2015   CL 105 08/30/2015   CREATININE 0.76 08/30/2015   BUN 9 08/30/2015   CO2 25 08/30/2015   TSH 2.43 08/30/2015   INR 1.17 05/17/2015   BP Readings from Last 3 Encounters:  09/06/15 120/82  07/08/15 122/88  06/02/15 112/88   Wt Readings from Last 3 Encounters:  09/06/15 189 lb 11.2 oz (86.047 kg)  07/14/15 183  lb (83.008 kg)  07/08/15 182 lb 14.4 oz (82.963 kg)     ASSESSMENT AND PLAN:  Discussed the following assessment and plan:  Visit for preventive health examination  Low HDL (under 40) - counseled  at risk lsi   Anemia, iron deficiency - improved stay on iron as tolerated now on depo prevera should help   Patient Care Team: Burnis Medin, MD as PCP - General (Internal Medicine) Patient Instructions  Continue  Iron as tolerated  Plan recheck cbcdiff and ferritin in  6 months and then ROV  Avoid sugar beverages  Try off for a month  And then limited . As discussed Your low HDL and bporderline sugar shows you may have risk to develop diabetes some day .  Exercise healthy eating and some weight loss can prevent future  Problems   No matter what your heredity .    Maintenance, Female Adopting a healthy lifestyle and getting preventive care can go a long way to promote health and wellness. Talk with your health care provider about what schedule of regular examinations is right for you. This is a good chance for you to check in with your provider about disease prevention and staying healthy. In between checkups, there are plenty of things you can do on your own. Experts have done a lot of research about which lifestyle changes and preventive measures are most likely to keep you healthy. Ask your health care provider for more information. WEIGHT AND DIET  Eat a healthy diet  Be sure to include plenty of vegetables, fruits,  low-fat dairy products, and lean protein.  Do not eat a lot of foods high in solid fats, added sugars, or salt.  Get regular exercise. This is one of the most important things you can do for your health.  Most adults should exercise for at least 150 minutes each week. The exercise should increase your heart rate and make you sweat (moderate-intensity exercise).  Most adults should also do strengthening exercises at least twice a week. This is in addition to the moderate-intensity exercise.  Maintain a healthy weight  Body mass index (BMI) is a measurement that can be used to identify possible weight problems. It estimates body fat based on height and weight. Your health care provider can help determine your BMI and help you achieve or maintain a healthy weight.  For females 26 years of age and older:   A BMI below 18.5 is considered underweight.  A BMI of 18.5 to 24.9 is normal.  A BMI of 25 to 29.9 is considered overweight.  A BMI of 30 and above is considered obese.  Watch levels of cholesterol and blood lipids  You should start having your blood tested for lipids and cholesterol at 31 years of age, then have this test every 5 years.  You may need to have your cholesterol levels checked more often if:  Your lipid or cholesterol levels are high.  You are older than 31 years of age.  You are at high risk for heart disease.  CANCER SCREENING   Lung Cancer  Lung cancer screening is recommended for adults 80-89 years old who are at high risk for lung cancer because of a history of smoking.  A yearly low-dose CT scan of the lungs is recommended for people who:  Currently smoke.  Have quit within the past 15 years.  Have at least a 30-pack-year history of smoking. A pack year is smoking an average of one pack of  cigarettes a day for 1 year.  Yearly screening should continue until it has been 15 years since you quit.  Yearly screening should stop if you develop a  health problem that would prevent you from having lung cancer treatment.  Breast Cancer  Practice breast self-awareness. This means understanding how your breasts normally appear and feel.  It also means doing regular breast self-exams. Let your health care provider know about any changes, no matter how small.  If you are in your 20s or 30s, you should have a clinical breast exam (CBE) by a health care provider every 1-3 years as part of a regular health exam.  If you are 66 or older, have a CBE every year. Also consider having a breast X-ray (mammogram) every year.  If you have a family history of breast cancer, talk to your health care provider about genetic screening.  If you are at high risk for breast cancer, talk to your health care provider about having an MRI and a mammogram every year.  Breast cancer gene (BRCA) assessment is recommended for women who have family members with BRCA-related cancers. BRCA-related cancers include:  Breast.  Ovarian.  Tubal.  Peritoneal cancers.  Results of the assessment will determine the need for genetic counseling and BRCA1 and BRCA2 testing. Cervical Cancer Your health care provider may recommend that you be screened regularly for cancer of the pelvic organs (ovaries, uterus, and vagina). This screening involves a pelvic examination, including checking for microscopic changes to the surface of your cervix (Pap test). You may be encouraged to have this screening done every 3 years, beginning at age 44.  For women ages 34-65, health care providers may recommend pelvic exams and Pap testing every 3 years, or they may recommend the Pap and pelvic exam, combined with testing for human papilloma virus (HPV), every 5 years. Some types of HPV increase your risk of cervical cancer. Testing for HPV may also be done on women of any age with unclear Pap test results.  Other health care providers may not recommend any screening for nonpregnant women who  are considered low risk for pelvic cancer and who do not have symptoms. Ask your health care provider if a screening pelvic exam is right for you.  If you have had past treatment for cervical cancer or a condition that could lead to cancer, you need Pap tests and screening for cancer for at least 20 years after your treatment. If Pap tests have been discontinued, your risk factors (such as having a new sexual partner) need to be reassessed to determine if screening should resume. Some women have medical problems that increase the chance of getting cervical cancer. In these cases, your health care provider may recommend more frequent screening and Pap tests. Colorectal Cancer  This type of cancer can be detected and often prevented.  Routine colorectal cancer screening usually begins at 31 years of age and continues through 31 years of age.  Your health care provider may recommend screening at an earlier age if you have risk factors for colon cancer.  Your health care provider may also recommend using home test kits to check for hidden blood in the stool.  A small camera at the end of a tube can be used to examine your colon directly (sigmoidoscopy or colonoscopy). This is done to check for the earliest forms of colorectal cancer.  Routine screening usually begins at age 64.  Direct examination of the colon should be repeated every 5-10 years  through 31 years of age. However, you may need to be screened more often if early forms of precancerous polyps or small growths are found. Skin Cancer  Check your skin from head to toe regularly.  Tell your health care provider about any new moles or changes in moles, especially if there is a change in a mole's shape or color.  Also tell your health care provider if you have a mole that is larger than the size of a pencil eraser.  Always use sunscreen. Apply sunscreen liberally and repeatedly throughout the day.  Protect yourself by wearing long  sleeves, pants, a wide-brimmed hat, and sunglasses whenever you are outside. HEART DISEASE, DIABETES, AND HIGH BLOOD PRESSURE   High blood pressure causes heart disease and increases the risk of stroke. High blood pressure is more likely to develop in:  People who have blood pressure in the high end of the normal range (130-139/85-89 mm Hg).  People who are overweight or obese.  People who are African American.  If you are 42-29 years of age, have your blood pressure checked every 3-5 years. If you are 49 years of age or older, have your blood pressure checked every year. You should have your blood pressure measured twice--once when you are at a hospital or clinic, and once when you are not at a hospital or clinic. Record the average of the two measurements. To check your blood pressure when you are not at a hospital or clinic, you can use:  An automated blood pressure machine at a pharmacy.  A home blood pressure monitor.  If you are between 69 years and 48 years old, ask your health care provider if you should take aspirin to prevent strokes.  Have regular diabetes screenings. This involves taking a blood sample to check your fasting blood sugar level.  If you are at a normal weight and have a low risk for diabetes, have this test once every three years after 31 years of age.  If you are overweight and have a high risk for diabetes, consider being tested at a younger age or more often. PREVENTING INFECTION  Hepatitis B  If you have a higher risk for hepatitis B, you should be screened for this virus. You are considered at high risk for hepatitis B if:  You were born in a country where hepatitis B is common. Ask your health care provider which countries are considered high risk.  Your parents were born in a high-risk country, and you have not been immunized against hepatitis B (hepatitis B vaccine).  You have HIV or AIDS.  You use needles to inject street drugs.  You live with  someone who has hepatitis B.  You have had sex with someone who has hepatitis B.  You get hemodialysis treatment.  You take certain medicines for conditions, including cancer, organ transplantation, and autoimmune conditions. Hepatitis C  Blood testing is recommended for:  Everyone born from [birth year] through 1967.  Anyone with known risk factors for hepatitis C. Sexually transmitted infections (STIs)  You should be screened for sexually transmitted infections (STIs) including gonorrhea and chlamydia if:  You are sexually active and are younger than 31 years of age.  You are older than 31 years of age and your health care provider tells you that you are at risk for this type of infection.  Your sexual activity has changed since you were last screened and you are at an increased risk for chlamydia or gonorrhea. Ask your health  care provider if you are at risk.  If you do not have HIV, but are at risk, it may be recommended that you take a prescription medicine daily to prevent HIV infection. This is called pre-exposure prophylaxis (PrEP). You are considered at risk if:  You are sexually active and do not regularly use condoms or know the HIV status of your partner(s).  You take drugs by injection.  You are sexually active with a partner who has HIV. Talk with your health care provider about whether you are at high risk of being infected with HIV. If you choose to begin PrEP, you should first be tested for HIV. You should then be tested every 3 months for as long as you are taking PrEP.  PREGNANCY   If you are premenopausal and you may become pregnant, ask your health care provider about preconception counseling.  If you may become pregnant, take 400 to 800 micrograms (mcg) of folic acid every day.  If you want to prevent pregnancy, talk to your health care provider about birth control (contraception). OSTEOPOROSIS AND MENOPAUSE   Osteoporosis is a disease in which the bones lose  minerals and strength with aging. This can result in serious bone fractures. Your risk for osteoporosis can be identified using a bone density scan.  If you are 59 years of age or older, or if you are at risk for osteoporosis and fractures, ask your health care provider if you should be screened.  Ask your health care provider whether you should take a calcium or vitamin D supplement to lower your risk for osteoporosis.  Menopause may have certain physical symptoms and risks.  Hormone replacement therapy may reduce some of these symptoms and risks. Talk to your health care provider about whether hormone replacement therapy is right for you.  HOME CARE INSTRUCTIONS   Schedule regular health, dental, and eye exams.  Stay current with your immunizations.   Do not use any tobacco products including cigarettes, chewing tobacco, or electronic cigarettes.  If you are pregnant, do not drink alcohol.  If you are breastfeeding, limit how much and how often you drink alcohol.  Limit alcohol intake to no more than 1 drink per day for nonpregnant women. One drink equals 12 ounces of beer, 5 ounces of wine, or 1 ounces of hard liquor.  Do not use street drugs.  Do not share needles.  Ask your health care provider for help if you need support or information about quitting drugs.  Tell your health care provider if you often feel depressed.  Tell your health care provider if you have ever been abused or do not feel safe at home.   This information is not intended to replace advice given to you by your health care provider. Make sure you discuss any questions you have with your health care provider.   Document Released: 03/27/2011 Document Revised: 10/02/2014 Document Reviewed: 08/13/2013 Elsevier Interactive Patient Education 2016 [place].  High Cholesterol High cholesterol refers to having a high level of cholesterol in your blood. Cholesterol is a white, waxy, fat-like protein that  your body needs in small amounts. Your liver makes all the cholesterol you need. Excess cholesterol comes from the food you eat. Cholesterol travels in your bloodstream through your blood vessels. If you have high cholesterol, deposits (plaque) may build up on the walls of your blood vessels. This makes the arteries narrower and stiffer. Plaque increases your risk of heart attack and stroke. Work with your health care  provider to keep your cholesterol levels in a healthy range. RISK FACTORS Several things can make you more likely to have high cholesterol. These include:   Eating foods high in animal fat (saturated fat) or cholesterol.  Being overweight.  Not getting enough exercise.  Having a family history of high cholesterol. SIGNS AND SYMPTOMS High cholesterol does not cause symptoms. DIAGNOSIS  Your health care provider can do a blood test to check whether you have high cholesterol. If you are older than 20, your health care provider may check your cholesterol every 4-6 years. You may be checked more often if you already have high cholesterol or other risk factors for heart disease. The blood test for cholesterol measures the following:  Bad cholesterol (LDL cholesterol). This is the type of cholesterol that causes heart disease. This number should be less than 100.  Good cholesterol (HDL cholesterol). This type helps protect against heart disease. A healthy level of HDL cholesterol is 60 or higher.  Total cholesterol. This is the combined number of LDL cholesterol and HDL cholesterol. A healthy number is less than 200. TREATMENT  High cholesterol can be treated with diet changes, lifestyle changes, and medicine.   Diet changes may include eating more whole grains, fruits, vegetables, nuts, and fish. You may also have to cut back on red meat and foods with a lot of added sugar.  Lifestyle changes may include getting at least 40 minutes of aerobic exercise three times a week. Aerobic  exercises include walking, biking, and swimming. Aerobic exercise along with a healthy diet can help you maintain a healthy weight. Lifestyle changes may also include quitting smoking.  If diet and lifestyle changes are not enough to lower your cholesterol, your health care provider may prescribe a statin medicine. This medicine has been shown to lower cholesterol and also lower the risk of heart disease. HOME CARE INSTRUCTIONS  Only take over-the-counter or prescription medicines as directed by your health care provider.   Follow a healthy diet as directed by your health care provider. For instance:   Eat chicken (without skin), fish, veal, shellfish, ground [place] breast, and round or loin cuts of red meat.  Do not eat fried foods and fatty meats, such as hot dogs and salami.   Eat plenty of fruits, such as apples.   Eat plenty of vegetables, such as broccoli, potatoes, and carrots.   Eat beans, peas, and lentils.   Eat grains, such as barley, rice, couscous, and bulgur wheat.   Eat pasta without cream sauces.   Use skim or nonfat milk and low-fat or nonfat yogurt and cheeses. Do not eat or drink whole milk, cream, ice cream, egg yolks, and hard cheeses.   Do not eat stick margarine or tub margarines that contain trans fats (also called partially hydrogenated oils).   Do not eat cakes, cookies, crackers, or other baked goods that contain trans fats.   Do not eat saturated tropical oils, such as coconut and palm oil.   Exercise as directed by your health care provider. Increase your activity level with activities such as gardening or walking.   Keep all follow-up appointments.  SEEK MEDICAL CARE IF:  You are struggling to maintain a healthy diet or weight.  You need help starting an exercise program.  You need help to stop smoking. SEEK IMMEDIATE MEDICAL CARE IF:  You have chest pain.  You have trouble breathing.   This information is not intended to  replace advice given to you by  your health care provider. Make sure you discuss any questions you have with your health care provider.   Document Released: 09/11/2005 Document Revised: 10/02/2014 Document Reviewed: 07/04/2013 Elsevier Interactive Patient Education 2016 [place] K. Panosh M.D.

## 2015-11-24 ENCOUNTER — Ambulatory Visit (INDEPENDENT_AMBULATORY_CARE_PROVIDER_SITE_OTHER): Payer: 59

## 2015-11-24 DIAGNOSIS — Z3042 Encounter for surveillance of injectable contraceptive: Secondary | ICD-10-CM

## 2015-11-24 NOTE — Progress Notes (Signed)
Patient present for Depo provera injection. Patient schedule her next injection before leaving. Armandina Stammer RN BSN

## 2016-01-07 ENCOUNTER — Other Ambulatory Visit: Payer: Self-pay | Admitting: Internal Medicine

## 2016-01-10 NOTE — Telephone Encounter (Signed)
Sent to the pharmacy by e-scribe. 

## 2016-02-23 ENCOUNTER — Ambulatory Visit: Payer: 59

## 2016-02-24 ENCOUNTER — Ambulatory Visit (INDEPENDENT_AMBULATORY_CARE_PROVIDER_SITE_OTHER): Payer: 59

## 2016-02-24 DIAGNOSIS — Z3042 Encounter for surveillance of injectable contraceptive: Secondary | ICD-10-CM

## 2016-03-06 NOTE — Progress Notes (Signed)
Pre visit review using our clinic review tool, if applicable. No additional management support is needed unless otherwise documented below in the visit note.  Chief Complaint  Patient presents with  . Follow-up    Continues to have Sinus Issues and now having nose bleeds. Migraines getting worse.  Has a long from March.  Lasting 3-5 days.  Mostly wakes with the migraines.    HPI: [name] 30 y.o.   Fu anemia   And HAs    Anemia   Told   No lab appt  In suystem so didn't get this done yet  No vag bleeding on  Dep  But having   Nose  Bleeds with sinus pressure     X 4 in past 3 months left   Side .  Stuffier there no fever    On depo.   Headaches   Tracking  Since march   8   . Hs had 4 +  Lasting  3-4 days.  Wakes up with them;  imitrex lightenss  Not gone.  Triggers  Stuffy down hill  Effect .  Smells    Has dec caffiene     decaff tea.   Sleep about 8 hours . Spread out.    Taking tried   imitrex with aleve no difference  So  ocass nasal spray  ocass goodies    Onset migraines  About age 30 .  . .    ROS: See pertinent positives and negatives per HPI.  Past Medical History  Diagnosis Date  . Anemia, iron deficiency     With elevated hemoglobin A2  . Chicken pox   . Egg donor     Family History  Problem Relation Age of Onset  . Hyperlipidemia Mother   . Miscarriages / [place]     Mother has 1 miscarriage  . Cancer Father     Lung Cancer  . Learning disabilities Sister     Sister has Cerebral Palsy and Epilepsy  . Mental retardation Sister   . Cancer Maternal Grandfather     Liver Cancer    Social History   Social History  . Marital Status: Single    Spouse Name: N/A  . Number of Children: N/A  . Years of Education: N/A   Social History Main Topics  . Smoking status: Never Smoker   . Smokeless tobacco: Never Used  . Alcohol Use: No  . Drug Use: No  . Sexual Activity: Yes    Birth Control/ Protection: Injection   Other Topics  Concern  . None   Social History Narrative   hhof 2 and 2  plus dog   Working  [employer]   [employer]   [employer]  Senior  [employer] .  6 hours   40 work  And 12 credits .Marland Kitchen. Done dec 16    No ets. Tobacco   caffiene  Once coke zero.   2-3    ETOH:  Wine weekends.    WALKING .     Working on knee  Problems  Has seen SM .  wendover  ocass aleve.           Outpatient Prescriptions Prior to Visit  Medication Sig Dispense Refill  . acetaminophen (TYLENOL) 500 MG tablet Take 1,000 mg by mouth every 6 (six) hours as needed for fever.    . fexofenadine (ALLEGRA) 180 MG tablet Take 180 mg by mouth daily as needed for allergies.     .Marland Kitchen  flunisolide (NASALIDE) 25 MCG/ACT (0.025%) SOLN USE 2 SPRAYS NASALLY TWICE DAILY 25 mL 1  . IRON PO Take 3 tablets by mouth at bedtime.    . Polyethylene Glycol 3350 (MIRALAX PO) Take 1 Dose by mouth daily as needed (constipation).    . Simethicone (GAS-X EXTRA STRENGTH PO) Take 1 tablet by mouth every 4 (four) hours as needed (gas).    . SUMAtriptan (IMITREX) 100 MG tablet 1 po at onset  Of migraine.Can repeat in 2 hours if needed (Patient taking differently: Take 100 mg by mouth every 2 (two) hours as needed for migraine. 1 po at onset  Of migraine.Can repeat in 2 hours if needed) 9 tablet 2  . vitamin C (ASCORBIC ACID) 500 MG tablet Take 1,000 mg by mouth daily.     Facility-Administered Medications Prior to Visit  Medication Dose Route Frequency Provider Last Rate Last Dose  . medroxyPROGESTERone (DEPO-PROVERA) injection 150 mg  150 mg Intramuscular Q90 days Willodean Rosenthal, MD   150 mg at 11/24/15 0827     EXAM:  BP 116/74 mmHg  Temp(Src) 98.2 F (36.8 C) (Oral)  Wt 199 lb 1.6 oz (90.311 kg)  Body mass index is 36.41 kg/(m^2).  GENERAL: vitals reviewed and listed above, alert, oriented, appears well hydrated and in no acute distress HEENT: atraumatic, conjunctiva  clear, no obvious abnormalities on inspection of external nose and ears  Left nares  congested  Large turbinate?  No dc face nt OP : no lesion edema or exudate  NECK: no obvious masses on inspection palpation  LUNGS: clear to auscultation bilaterally, no wheezes, rales or rhonchi, good air movement CV: HRRR, no clubbing cyanosis or  peripheral edema nl cap refill  MS: moves all extremities without noticeable focal  abnormality PSYCH: pleasant and cooperative, no obvious depression or anxiety Lab Results  Component Value Date   WBC 4.7 08/30/2015   HGB 11.0* 08/30/2015   HCT 34.1* 08/30/2015   PLT 254.0 Large Platelets seen on smear. 08/30/2015   GLUCOSE 100* 08/30/2015   CHOL 144 08/30/2015   TRIG 122.0 08/30/2015   HDL 27.20* 08/30/2015   LDLCALC 92 08/30/2015   ALT 21 08/30/2015   AST 17 08/30/2015   NA 137 08/30/2015   K 4.3 08/30/2015   CL 105 08/30/2015   CREATININE 0.76 08/30/2015   BUN 9 08/30/2015   CO2 25 08/30/2015   TSH 2.43 08/30/2015   INR 1.17 05/17/2015    ASSESSMENT AND PLAN:  Discussed the following assessment and plan:  Anemia, iron deficiency - fu labs  - Plan: CBC with Differential/Platelet, IBC panel, Ferritin  Frequent nosebleeds - left sided  x 4 with stuffiness  and  hx fo headaches sinus sx - Plan: Ambulatory referral to ENT  Nonintractable migraine, unspecified migraine type - Problematic lasting 3-4 days multiple triggers uncertain intervention. Discussed prevention risk-benefit begin Topamax increase as tolerated follow-up . -Patient advised to return or notify health care team  if symptoms worsen ,persist or new concerns arise.  Risk benefit of medication discussed.  Patient Instructions  Will notify you  of labs when available. You'll be contacted about referral for ear nose and throat doctor for recurrent left-sided nosebleeds and stuffiness. Continue to track her headaches. Begin Topamax prophylaxis and increase dose as tolerated. Many people do well on lower dose than maximum.  Plan follow-up visit in about 2 months  regarding  headaches.   Neta Mends. Panosh M.D.

## 2016-03-07 ENCOUNTER — Ambulatory Visit (INDEPENDENT_AMBULATORY_CARE_PROVIDER_SITE_OTHER): Payer: 59 | Admitting: Internal Medicine

## 2016-03-07 ENCOUNTER — Encounter: Payer: Self-pay | Admitting: Internal Medicine

## 2016-03-07 VITALS — BP 116/74 | Temp 98.2°F | Wt 199.1 lb

## 2016-03-07 DIAGNOSIS — D509 Iron deficiency anemia, unspecified: Secondary | ICD-10-CM

## 2016-03-07 DIAGNOSIS — R040 Epistaxis: Secondary | ICD-10-CM

## 2016-03-07 DIAGNOSIS — G43009 Migraine without aura, not intractable, without status migrainosus: Secondary | ICD-10-CM

## 2016-03-07 MED ORDER — TOPIRAMATE 50 MG PO TABS
ORAL_TABLET | ORAL | Status: DC
Start: 2016-03-07 — End: 2016-05-23

## 2016-03-07 NOTE — Patient Instructions (Addendum)
Will notify you  of labs when available. You'll be contacted about referral for ear nose and throat doctor for recurrent left-sided nosebleeds and stuffiness. Continue to track her headaches. Begin Topamax prophylaxis and increase dose as tolerated. Many people do well on lower dose than maximum.  Plan follow-up visit in about 2 months regarding  headaches.

## 2016-03-08 LAB — CBC WITH DIFFERENTIAL/PLATELET
Basophils Absolute: 0.1 10*3/uL (ref 0.0–0.1)
Basophils Relative: 1.1 % (ref 0.0–3.0)
Eosinophils Absolute: 0.1 10*3/uL (ref 0.0–0.7)
Eosinophils Relative: 1.5 % (ref 0.0–5.0)
HCT: 36.5 % (ref 36.0–46.0)
HEMOGLOBIN: 11.9 g/dL — AB (ref 12.0–15.0)
Lymphocytes Relative: 39.5 % (ref 12.0–46.0)
Lymphs Abs: 2.4 10*3/uL (ref 0.7–4.0)
MCHC: 32.7 g/dL (ref 30.0–36.0)
MCV: 76.7 fl — AB (ref 78.0–100.0)
MONO ABS: 0.4 10*3/uL (ref 0.1–1.0)
MONOS PCT: 6.4 % (ref 3.0–12.0)
Neutro Abs: 3.1 10*3/uL (ref 1.4–7.7)
Neutrophils Relative %: 51.5 % (ref 43.0–77.0)
Platelets: 243 10*3/uL (ref 150.0–400.0)
RBC: 4.75 Mil/uL (ref 3.87–5.11)
RDW: 14.9 % (ref 11.5–15.5)
WBC: 5.9 10*3/uL (ref 4.0–10.5)

## 2016-03-08 LAB — IBC PANEL
IRON: 78 ug/dL (ref 42–145)
Saturation Ratios: 20.9 % (ref 20.0–50.0)
Transferrin: 266 mg/dL (ref 212.0–360.0)

## 2016-03-08 LAB — FERRITIN: FERRITIN: 36.9 ng/mL (ref 10.0–291.0)

## 2016-05-07 ENCOUNTER — Other Ambulatory Visit: Payer: Self-pay | Admitting: Internal Medicine

## 2016-05-09 NOTE — Telephone Encounter (Signed)
Is pt still taking this medication or only topamax?

## 2016-05-09 NOTE — Telephone Encounter (Signed)
Ok to refill x 3 

## 2016-05-09 NOTE — Telephone Encounter (Signed)
Sent to the pharmacy by e-scribe. 

## 2016-05-23 ENCOUNTER — Other Ambulatory Visit: Payer: Self-pay | Admitting: Internal Medicine

## 2016-05-24 ENCOUNTER — Ambulatory Visit (INDEPENDENT_AMBULATORY_CARE_PROVIDER_SITE_OTHER): Payer: 59

## 2016-05-24 DIAGNOSIS — Z3042 Encounter for surveillance of injectable contraceptive: Secondary | ICD-10-CM

## 2016-05-24 NOTE — Telephone Encounter (Signed)
Sent to the pharmacy by e-scribe.  Pt has upcoming follow up on 06/07/16

## 2016-06-07 ENCOUNTER — Encounter: Payer: Self-pay | Admitting: Internal Medicine

## 2016-06-07 ENCOUNTER — Ambulatory Visit (INDEPENDENT_AMBULATORY_CARE_PROVIDER_SITE_OTHER): Payer: 59 | Admitting: Internal Medicine

## 2016-06-07 VITALS — BP 118/80 | Temp 98.7°F | Wt 198.0 lb

## 2016-06-07 DIAGNOSIS — Z23 Encounter for immunization: Secondary | ICD-10-CM

## 2016-06-07 DIAGNOSIS — Z79899 Other long term (current) drug therapy: Secondary | ICD-10-CM | POA: Diagnosis not present

## 2016-06-07 DIAGNOSIS — G43009 Migraine without aura, not intractable, without status migrainosus: Secondary | ICD-10-CM | POA: Diagnosis not present

## 2016-06-07 MED ORDER — ELETRIPTAN HYDROBROMIDE 40 MG PO TABS: 40.0000 mg | ORAL_TABLET | ORAL | 0 refills | Status: DC | PRN

## 2016-06-07 NOTE — Patient Instructions (Signed)
Try taking 2 Topamax at night or 100 mg see if will help with headache pattern frequency. Try a different rescue medicine same class. You can ask your insurance and sometimes the pharmacy about which other medications include class are covered by your formulary insurance. Sometimes processed call prior authorization is needed to  try a different medicine.   ROV in another 3 -4 months .

## 2016-06-07 NOTE — Progress Notes (Signed)
Pre visit review using our clinic review tool, if applicable. No additional management support is needed unless otherwise documented below in the visit note.  Chief Complaint  Patient presents with  . Follow-up    HPI: [name] 30 y.o.   Fu anemia and headaches  They a re definitely   less often but last   Hangs out for days .    Last one in august .    Only 3 since last visit usually  left  Nasal area and then spreads more often night  On topamax    Makes drowsy .  So only taking  50 mg at night .    Take  imitrex and repeat  Dose.  Some help but not  Resolving it  No nose bleeds other bleeding doing better   No nose irritation .  ROS: See pertinent positives and negatives per HPI.  Past Medical History:  Diagnosis Date  . Anemia, iron deficiency    With elevated hemoglobin A2  . Chicken pox   . Egg donor     Family History  Problem Relation Age of Onset  . Hyperlipidemia Mother   . Miscarriages / [place] Mother     Mother has 1 miscarriage  . Cancer Father     Lung Cancer  . Learning disabilities Sister     Sister has Cerebral Palsy and Epilepsy  . Mental retardation Sister   . Cancer Maternal Grandfather     Liver Cancer    Social History   Social History  . Marital status: Single    Spouse name: N/A  . Number of children: N/A  . Years of education: N/A   Social History Main Topics  . Smoking status: Never Smoker  . Smokeless tobacco: Never Used  . Alcohol use No  . Drug use: No  . Sexual activity: Yes    Birth control/ protection: Injection   Other Topics Concern  . None   Social History Narrative   hhof 2 and 2  plus dog   Working  [employer]   [employer]   [employer]  Senior  [employer] .  6 hours   40 work  And 12 credits .Marland [employer]. Done dec 16    No ets. Tobacco   caffiene  Once coke zero.   2-3    ETOH:  Wine weekends.    WALKING .     Working on knee  Problems  Has seen SM .  wendover  ocass aleve.           Outpatient Medications Prior to Visit    Medication Sig Dispense Refill  . acetaminophen (TYLENOL) 500 MG tablet Take 1,000 mg by mouth every 6 (six) hours as needed for fever.    . fexofenadine (ALLEGRA) 180 MG tablet Take 180 mg by mouth daily as needed for allergies.     . flunisolide (NASALIDE) 25 MCG/ACT (0.025%) SOLN USE 2 SPRAYS NASALLY TWICE DAILY 25 mL 1  . IRON PO Take 3 tablets by mouth at bedtime.    . Polyethylene Glycol 3350 (MIRALAX PO) Take 1 Dose by mouth daily as needed (constipation).    . Simethicone (GAS-X EXTRA STRENGTH PO) Take 1 tablet by mouth every 4 (four) hours as needed (gas).    . SUMAtriptan (IMITREX) 100 MG tablet TAKE 1 TABLET BY MOUTH AT ONSET OF MIGRAINE. CAN REPEAT IN 2 HOURS AS NEEDED 9 tablet 2  . topiramate (TOPAMAX) 50 MG tablet Take 1 tablet (50 mg  total) by mouth 2 (two) times daily. 60 tablet 0  . vitamin C (ASCORBIC ACID) 500 MG tablet Take 1,000 mg by mouth daily.     Facility-Administered Medications Prior to Visit  Medication Dose Route Frequency Provider Last Rate Last Dose  . medroxyPROGESTERone (DEPO-PROVERA) injection 150 mg  150 mg Intramuscular Q90 days Willodean Rosenthal, MD   150 mg at 05/24/16 1621     EXAM:  BP 118/80 (BP Location: Right Arm, Patient Position: Sitting, Cuff Size: Normal)   Temp 98.7 F (37.1 C) (Oral)   Wt 198 lb (89.8 kg)   BMI 36.21 kg/m   Body mass index is 36.21 kg/m.  GENERAL: vitals reviewed and listed above, alert, oriented, appears well hydrated and in no acute distress HEENT: atraumatic, conjunctiva  clear, no obvious abnormalities on inspection of external nose and ears  NECK: no obvious masses on inspection palpation  LUNGS: clear to auscultation bilaterally, no wheezes, rales or rhonchi,  CV: HRRR, no clubbing cyanosis or  peripheral edema nl cap refill  Abdomen:  Sof,t normal bowel sounds without hepatosplenomegaly, no guarding rebound or masses no CVA tenderness MS: moves all extremities without noticeable focal   abnormality PSYCH: pleasant and cooperative, no obvious depression or anxiety Lab Results  Component Value Date   WBC 5.9 03/07/2016   HGB 11.9 (L) 03/07/2016   HCT 36.5 03/07/2016   PLT 243.0 03/07/2016   GLUCOSE 100 (H) 08/30/2015   CHOL 144 08/30/2015   TRIG 122.0 08/30/2015   HDL 27.20 (L) 08/30/2015   LDLCALC 92 08/30/2015   ALT 21 08/30/2015   AST 17 08/30/2015   NA 137 08/30/2015   K 4.3 08/30/2015   CL 105 08/30/2015   CREATININE 0.76 08/30/2015   BUN 9 08/30/2015   CO2 25 08/30/2015   TSH 2.43 08/30/2015   INR 1.17 05/17/2015   BP Readings from Last 3 Encounters:  06/07/16 118/80  03/07/16 116/74  09/06/15 120/82   Wt Readings from Last 3 Encounters:  06/07/16 198 lb (89.8 kg)  03/07/16 199 lb 1.6 oz (90.3 kg)  09/06/15 189 lb 11.2 oz (86 kg)    ASSESSMENT AND PLAN:  Discussed the following assessment and plan:  Nonintractable migraine, unspecified migraine type - try 100 mg at night  and different triptan as per insurance  formulary   Need for prophylactic vaccination and inoculation against influenza - Plan: Flu Vaccine QUAD 36+ mos PF IM (Fluarix & Fluzone Quad PF)  Medication management Can do fu anemia at fu but probably   Resolved  -Patient advised to return or notify health care team  if symptoms worsen ,persist or new concerns arise.  Patient Instructions  Try taking 2 Topamax at night or 100 mg see if will help with headache pattern frequency. Try a different rescue medicine same class. You can ask your insurance and sometimes the pharmacy about which other medications include class are covered by your formulary insurance. Sometimes processed call prior authorization is needed to  try a different medicine.   ROV in another 3 -4 months .     Neta Mends. Jinnifer Montejano M.D.

## 2016-07-12 ENCOUNTER — Ambulatory Visit (INDEPENDENT_AMBULATORY_CARE_PROVIDER_SITE_OTHER): Payer: 59 | Admitting: Obstetrics & Gynecology

## 2016-07-12 ENCOUNTER — Other Ambulatory Visit: Payer: Self-pay | Admitting: Internal Medicine

## 2016-07-12 ENCOUNTER — Encounter: Payer: Self-pay | Admitting: Obstetrics & Gynecology

## 2016-07-12 VITALS — BP 129/90 | Ht 61.25 in | Wt 197.0 lb

## 2016-07-12 DIAGNOSIS — Z124 Encounter for screening for malignant neoplasm of cervix: Secondary | ICD-10-CM

## 2016-07-12 DIAGNOSIS — Z1151 Encounter for screening for human papillomavirus (HPV): Secondary | ICD-10-CM | POA: Diagnosis not present

## 2016-07-12 DIAGNOSIS — Z01419 Encounter for gynecological examination (general) (routine) without abnormal findings: Secondary | ICD-10-CM | POA: Diagnosis not present

## 2016-07-12 DIAGNOSIS — Z113 Encounter for screening for infections with a predominantly sexual mode of transmission: Secondary | ICD-10-CM | POA: Diagnosis not present

## 2016-07-12 NOTE — Patient Instructions (Signed)
Sexually Transmitted Disease °A sexually transmitted disease (STD) is a disease or infection that may be passed (transmitted) from person to person, usually during sexual activity. This may happen by way of saliva, semen, blood, vaginal mucus, or urine. Common STDs include: °· Gonorrhea. °· Chlamydia. °· Syphilis. °· HIV and AIDS. °· Genital herpes. °· Hepatitis B and C. °· Trichomonas. °· Human papillomavirus (HPV). °· Pubic lice. °· Scabies. °· Mites. °· Bacterial vaginosis. °WHAT ARE CAUSES OF STDs? °An STD may be caused by bacteria, a virus, or parasites. STDs are often transmitted during sexual activity if one person is infected. However, they may also be transmitted through nonsexual means. STDs may be transmitted after:  °· Sexual intercourse with an infected person. °· Sharing sex toys with an infected person. °· Sharing needles with an infected person or using unclean piercing or tattoo needles. °· Having intimate contact with the genitals, mouth, or rectal areas of an infected person. °· Exposure to infected fluids during birth. °WHAT ARE THE SIGNS AND SYMPTOMS OF STDs? °Different STDs have different symptoms. Some people may not have any symptoms. If symptoms are present, they may include: °· Painful or bloody urination. °· Pain in the pelvis, abdomen, vagina, anus, throat, or eyes. °· A skin rash, itching, or irritation. °· Growths, ulcerations, blisters, or sores in the genital and anal areas. °· Abnormal vaginal discharge with or without bad odor. °· Penile discharge in men. °· Fever. °· Pain or bleeding during sexual intercourse. °· Swollen glands in the groin area. °· Yellow skin and eyes (jaundice). This is seen with hepatitis. °· Swollen testicles. °· Infertility. °· Sores and blisters in the mouth. °HOW ARE STDs DIAGNOSED? °To make a diagnosis, your health care provider may: °· Take a medical history. °· Perform a physical exam. °· Take a sample of any discharge to examine. °· Swab the throat,  cervix, opening to the penis, rectum, or vagina for testing. °· Test a sample of your first morning urine. °· Perform blood tests. °· Perform a Pap test, if this applies. °· Perform a colposcopy. °· Perform a laparoscopy. °HOW ARE STDs TREATED? °Treatment depends on the STD. Some STDs may be treated but not cured. °· Chlamydia, gonorrhea, trichomonas, and syphilis can be cured with antibiotic medicine. °· Genital herpes, hepatitis, and HIV can be treated, but not cured, with prescribed medicines. The medicines lessen symptoms. °· Genital warts from HPV can be treated with medicine or by freezing, burning (electrocautery), or surgery. Warts may come back. °· HPV cannot be cured with medicine or surgery. However, abnormal areas may be removed from the cervix, vagina, or vulva. °· If your diagnosis is confirmed, your recent sexual partners need treatment. This is true even if they are symptom-free or have a negative culture or evaluation. They should not have sex until their health care providers say it is okay. °· Your health care provider may test you for infection again 3 months after treatment. °HOW CAN I REDUCE MY RISK OF GETTING AN STD? °Take these steps to reduce your risk of getting an STD: °· Use latex condoms, dental dams, and water-soluble lubricants during sexual activity. Do not use petroleum jelly or oils. °· Avoid having multiple sex partners. °· Do not have sex with someone who has other sex partners °· Do not have sex with anyone you do not know or who is at high risk for an STD. °· Avoid risky sex practices that can break your skin. °· Do not have sex   if you have open sores on your mouth or skin. °· Avoid drinking too much alcohol or taking illegal drugs. Alcohol and drugs can affect your judgment and put you in a vulnerable position. °· Avoid engaging in oral and anal sex acts. °· Get vaccinated for HPV and hepatitis. If you have not received these vaccines in the past, talk to your health care  provider about whether one or both might be right for you. °· If you are at risk of being infected with HIV, it is recommended that you take a prescription medicine daily to prevent HIV infection. This is called pre-exposure prophylaxis (PrEP). You are considered at risk if: °¨ You are a man who has sex with other men (MSM). °¨ You are a heterosexual man or woman and are sexually active with more than one partner. °¨ You take drugs by injection. °¨ You are sexually active with a partner who has HIV. °· Talk with your health care provider about whether you are at high risk of being infected with HIV. If you choose to begin PrEP, you should first be tested for HIV. You should then be tested every 3 months for as long as you are taking PrEP. °WHAT SHOULD I DO IF I THINK I HAVE AN STD? °· See your health care provider. °· Tell your sexual partner(s). They should be tested and treated for any STDs. °· Do not have sex until your health care provider says it is okay. °WHEN SHOULD I GET IMMEDIATE MEDICAL CARE? °Contact your health care provider right away if:  °· You have severe abdominal pain. °· You are a man and notice swelling or pain in your testicles. °· You are a woman and notice swelling or pain in your vagina. °  °This information is not intended to replace advice given to you by your health care provider. Make sure you discuss any questions you have with your health care provider. °  °Document Released: 12/02/2002 Document Revised: 10/02/2014 Document Reviewed: 04/01/2013 °Elsevier Interactive Patient Education ©2016 [employer] ° °

## 2016-07-12 NOTE — Progress Notes (Signed)
Subjective:     [name] is a 30 y.o. female here for a routine exam.  Current complaints: none.  For well woman exam.  Last PAP in 2014. Amenorrheic on depo Provera.     Gynecologic History No LMP recorded. Contraception: Depo-Provera injections Last Pap: 10/2012. Results were: normal Last mammogram: n/a. Results were: normal  Obstetric History OB History  Gravida Para Term Preterm AB Living  0 0 0 0 0 0  SAB TAB Ectopic Multiple Live Births  0 0 0 0          The following portions of the patient's history were reviewed and updated as appropriate: allergies, current medications, past family history, past medical history, past social history, past surgical history and problem list.  Review of Systems Pertinent items are noted in HPI.    Objective:  BP 129/90 (BP Location: Left Arm)   Ht 5' 1.25" (1.556 m)   Wt 197 lb (89.4 kg)   BMI 36.92 kg/m   General Appearance:    Alert, cooperative, no distress, appears stated age  Head:    Normocephalic, without obvious abnormality, atraumatic  Eyes:    conjunctiva/corneas clear, EOM's intact, both eyes  Ears:    Normal external ear canals, both ears  Nose:   Nares normal, septum midline, mucosa normal, no drainage    or sinus tenderness  Throat:   Lips, mucosa, and tongue normal; teeth and gums normal  Neck:   Supple, symmetrical, trachea midline, no adenopathy;    thyroid:  no enlargement/tenderness/nodules  Back:     Symmetric, no curvature, ROM normal, no CVA tenderness  Lungs:     Clear to auscultation bilaterally, respirations unlabored  Chest Wall:    No tenderness or deformity   Heart:    Regular rate and rhythm, S1 and S2 normal, no murmur, rub   or gallop  Breast Exam:    No tenderness, masses, or nipple abnormality  Abdomen:     Soft, non-tender, bowel sounds active all four quadrants,    no masses, no organomegaly  Genitalia:    Normal female without lesion, discharge or tenderness     Extremities:   Extremities  normal, atraumatic, no cyanosis or edema  Pulses:   2+ and symmetric all extremities  Skin:   Skin color, texture, turgor normal, no rashes or lesions      Assessment:    Healthy female exam.   STI screen   Plan:    Contraception: Depo-Provera injections. Follow up in: 1 year.     Marely Apgar L. Harraway-Smith, M.D., Evern CoreFACOG

## 2016-07-13 NOTE — Telephone Encounter (Signed)
Sent to the pharmacy by e-scribe for 3 months.  Pt has follow up scheduled for 10/11/16.

## 2016-07-14 LAB — HIV ANTIBODY (ROUTINE TESTING W REFLEX): HIV: NONREACTIVE

## 2016-07-14 LAB — CYTOLOGY - PAP
Chlamydia: NEGATIVE
Diagnosis: NEGATIVE
HPV: NOT DETECTED
Neisseria Gonorrhea: NEGATIVE

## 2016-07-14 LAB — HEPATITIS C ANTIBODY: HCV AB: NEGATIVE

## 2016-07-14 LAB — HEPATITIS B SURFACE ANTIGEN: HEP B S AG: NEGATIVE

## 2016-07-14 LAB — RPR

## 2016-08-23 ENCOUNTER — Ambulatory Visit (INDEPENDENT_AMBULATORY_CARE_PROVIDER_SITE_OTHER): Payer: 59 | Admitting: *Deleted

## 2016-08-23 DIAGNOSIS — Z3042 Encounter for surveillance of injectable contraceptive: Secondary | ICD-10-CM

## 2016-10-11 ENCOUNTER — Ambulatory Visit: Payer: 59 | Admitting: Internal Medicine

## 2016-11-15 ENCOUNTER — Ambulatory Visit (INDEPENDENT_AMBULATORY_CARE_PROVIDER_SITE_OTHER): Payer: 59

## 2016-11-15 DIAGNOSIS — Z3042 Encounter for surveillance of injectable contraceptive: Secondary | ICD-10-CM | POA: Diagnosis not present

## 2016-11-15 MED ORDER — MEDROXYPROGESTERONE ACETATE 150 MG/ML IM SUSP: 150.0000 mg | INTRAMUSCULAR | 2 refills | Status: DC

## 2016-11-15 NOTE — Progress Notes (Signed)
Patient present for Depo Provera. Clinic supplied medication but patient told that she will need to pick up her next prescription at her pharmacy and bring with her to her next visit. Patient scheduled for next injection 02-07-17 Armandina StammerJennifer Howard RN BSN

## 2016-12-01 ENCOUNTER — Other Ambulatory Visit: Payer: Self-pay | Admitting: Internal Medicine

## 2016-12-07 ENCOUNTER — Other Ambulatory Visit: Payer: Self-pay | Admitting: Internal Medicine

## 2016-12-07 MED ORDER — TOPIRAMATE 50 MG PO TABS: ORAL_TABLET | ORAL | 3 refills | Status: DC

## 2017-02-07 ENCOUNTER — Ambulatory Visit: Payer: 59

## 2017-02-08 ENCOUNTER — Ambulatory Visit (INDEPENDENT_AMBULATORY_CARE_PROVIDER_SITE_OTHER): Payer: 59

## 2017-02-08 DIAGNOSIS — Z3042 Encounter for surveillance of injectable contraceptive: Secondary | ICD-10-CM

## 2017-02-08 NOTE — Progress Notes (Signed)
Patient tolerated Depo Provera injection well. Patient supplied medication. Armandina StammerJennifer Howard RNBSN

## 2017-03-11 ENCOUNTER — Other Ambulatory Visit: Payer: Self-pay | Admitting: Internal Medicine

## 2017-04-12 ENCOUNTER — Telehealth: Payer: Self-pay | Admitting: Emergency Medicine

## 2017-04-12 ENCOUNTER — Telehealth: Payer: Self-pay | Admitting: Internal Medicine

## 2017-04-12 NOTE — Telephone Encounter (Signed)
Patient needs to schedule a physical hasn't had one since 2016. Please help patient schedule.

## 2017-04-12 NOTE — Telephone Encounter (Signed)
Can refill x 1  OV before refills  Get on the schedule

## 2017-04-12 NOTE — Telephone Encounter (Signed)
lmom for pt to call back

## 2017-04-13 NOTE — Telephone Encounter (Signed)
Patient is over due for a medication check and annual. Sending in Imitrex to pharmacy. Please help patient get on schedule. Thank you

## 2017-04-16 NOTE — Telephone Encounter (Signed)
Pt has been sch for nov 2018 °

## 2017-04-18 NOTE — Telephone Encounter (Signed)
Pt has been sch for nov 2018 °

## 2017-04-24 ENCOUNTER — Ambulatory Visit: Payer: 59 | Admitting: Internal Medicine

## 2017-05-10 ENCOUNTER — Ambulatory Visit (INDEPENDENT_AMBULATORY_CARE_PROVIDER_SITE_OTHER): Payer: 59

## 2017-05-10 VITALS — BP 128/80 | HR 90

## 2017-05-10 DIAGNOSIS — Z3042 Encounter for surveillance of injectable contraceptive: Secondary | ICD-10-CM

## 2017-05-10 NOTE — Progress Notes (Addendum)
Patient present for Depo Provera. Patient due to annual exam at next injection visit. Armandina StammerJennifer Lambros Cerro RNBSN  Attestation of Attending Supervision of RN: Evaluation and management procedures were performed by the nurse under my supervision and collaboration.  I have reviewed the nursing note and chart, and I agree with the management and plan.  Carolyn Harraway-Smith 10:08 AM

## 2017-06-15 ENCOUNTER — Encounter: Payer: Self-pay | Admitting: Internal Medicine

## 2017-07-14 ENCOUNTER — Other Ambulatory Visit: Payer: Self-pay | Admitting: Internal Medicine

## 2017-07-17 NOTE — Telephone Encounter (Signed)
Refill request for Medication: Imitrex 100mg  - Sig: TAKE 1 TABLET BY MOUTH AT ONSET OF MIGRAINE. CAN REPEAT IN 2 HOURS AS NEEDED Last Filled: 04/13/17, #9 x 0 refills Previous / Upcoming Appt: 08/08/17  Please advise Dr Fabian SharpPanosh, thanks.

## 2017-08-07 NOTE — Progress Notes (Signed)
Chief Complaint  Patient presents with  . Annual Exam    No new concerns.     HPI: Patient  [name]  31 y.o. comes in today for Preventive Health Care visit  Also follow-up anemia lab tests. Etc     She does see OB/GYN and also has a dermatologist.  Please refilled the nasal cortisone In regard to her headache she is having one about every 6-8 weeks off and cannot tell the trigger.  Usually has to take 2 doses the Imitrex.  Is not taking Relpax was too expensive.  Please check dark spot on left breast does not remember any trauma no pain not changing but is new. On depo now and  Not a lot of bleeding  For the past 2 years for so   Health Maintenance  Topic Date Due  . INFLUENZA VACCINE  04/25/2017  . PAP SMEAR  07/13/2019  . TETANUS/TDAP  11/06/2022  . 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 GI /GU: No adominal pain, vomiting, change in bowel habits. No blood in the stool. No significant GU symptoms. SKIN/HEME: ,no acute skin rashes suspicious lesions or bleeding. No lymphadenopathy, nodules, masses.  NEURO/ PSYCH:  No neurologic signs such as weakness numbness. No depression anxiety. IMM/ Allergy: No unusual infections.  Allergy .   REST of 12 system review negative except as per HPI   Past Medical History:  Diagnosis Date  . Anemia, iron deficiency    With elevated hemoglobin A2  . Chicken pox   . Egg donor     Past Surgical History:  Procedure Laterality Date  . Denies surgical hx    . tuboovarian abcess  04-2015    Family History  Problem Relation Age of Onset   . Hyperlipidemia Mother   . Miscarriages / [place] Mother        Mother has 1 miscarriage  . Cancer Father        Lung Cancer  . Learning disabilities Sister        Sister has Cerebral Palsy and Epilepsy  . Mental retardation Sister   . Cancer Maternal Grandfather        Liver Cancer    Social History   Socioeconomic History  . Marital status: Single    Spouse name: Not on file  . Number of children: Not on file  . Years of education: Not on file  . Highest education level: Not on file  Social Needs  . Financial resource strain: Not on file  . Food insecurity - worry: Not on file  . Food insecurity - inability: Not on file  . Transportation needs - medical: Not on file  . Transportation needs - non-medical: Not on file  Occupational History  . Not on file  Tobacco Use  . Smoking status: Never Smoker  . Smokeless tobacco: Never Used  Substance and Sexual Activity  . Alcohol use: No    Alcohol/week: 0.0 oz  . Drug use: No  . Sexual activity: Yes    Birth control/protection: Injection  Other Topics Concern  .  Not on file  Social History Narrative   hhof 2 and 2  plus dog   Working  Dispensing optician.   School [employer]  Sen[employer] .  6 hours   40 work  And 12 credits .Marland Kitchen Done dec 16    No ets. Tobacco   caffiene  Once coke zero.   2-3    ETOH:  Wine weekends.    WALKING .     Working on knee  Problems  Has seen SM .  wendover  ocass aleve.        Outpatient Medications Prior to Visit  Medication Sig Dispense Refill  . acetaminophen (TYLENOL) 500 MG tablet Take 1,000 mg by mouth every 6 (six) hours as needed for fever.    . fexofenadine (ALLEGRA) 180 MG tablet Take 180 mg by mouth daily as needed for allergies.     . flunisolide (NASALIDE) 25 MCG/ACT (0.025%) SOLN USE 2 SPRAYS NASALLY TWICE DAILY 25 mL 1  . IRON PO Take 3 tablets by mouth at bedtime.    . medroxyPROGESTERone (DEPO-PROVERA) 150 MG/ML injection Inject 1 mL (150 mg total) into the muscle every 3 (three)  months. 1 mL 2  . Polyethylene Glycol 3350 (MIRALAX PO) Take 1 Dose by mouth daily as needed (constipation).    . Simethicone (GAS-X EXTRA STRENGTH PO) Take 1 tablet by mouth every 4 (four) hours as needed (gas).    . SUMAtriptan (IMITREX) 100 MG tablet TAKE 1 TABLET BY MOUTH AT ONSET OF MIGRAINE. CAN REPEAT IN 2 HOURS AS NEEDED 9 tablet 0  . topiramate (TOPAMAX) 50 MG tablet TAKE 1 TABLET(50 MG) BY MOUTH TWICE DAILY 60 tablet 3  . vitamin C (ASCORBIC ACID) 500 MG tablet Take 1,000 mg by mouth daily.    Marland Kitchen eletriptan (RELPAX) 40 MG tablet Take 1 tablet (40 mg total) by mouth as needed for migraine or headache. May repeat in 2 hours if headache persists or recurs. 10 tablet 0   Facility-Administered Medications Prior to Visit  Medication Dose Route Frequency Provider Last Rate Last Dose  . medroxyPROGESTERone (DEPO-PROVERA) injection 150 mg  150 mg Intramuscular Q90 days Lavonia Drafts, MD   150 mg at 05/24/16 1621     EXAM:  BP 128/86 (BP Location: Left Arm, Patient Position: Sitting, Cuff Size: Normal)   Pulse 72   Temp 98.5 F (36.9 C) (Oral)   Ht 5' 1.5" (1.562 m)   Wt 197 lb 4.8 oz (89.5 kg)   BMI 36.68 kg/m   Body mass index is 36.68 kg/m. Wt Readings from Last 3 Encounters:  08/08/17 197 lb 4.8 oz (89.5 kg)  07/12/16 197 lb (89.4 kg)  06/07/16 198 lb (89.8 kg)    Physical Exam: Vital signs reviewed WSF:KCLE is a well-developed well-nourished alert cooperative    who appearsr stated age in no acute distress.  HEENT: normocephalic atraumatic , Eyes: PERRL EOM's full, conjunctiva clear, Nares: paten,t no deformity discharge or tenderness., Ears: no deformity EAC's clear TMs with normal landmarks. Mouth: clear OP, no lesions, edema.  Moist mucous membranes. Dentition in adequate repair. NECK: supple without masses, thyromegaly or bruits. CHEST/PULM:  Clear to auscultation and percussion breath sounds equal no wheeze , rales or rhonchi. No chest wall deformities or  tenderness. Breast: normal by inspection . No dimpling, discharge, masses, tenderness or discharge . CV: PMI is nondisplaced, S1 S2 no gallops, murmurs, rubs. Peripheral pulses are full without delay.No JVD .  ABDOMEN: Bowel sounds  normal nontender  No guard or rebound, no hepato splenomegal no CVA tenderness.  No hernia. Extremtities:  No clubbing cyanosis or edema, no acute joint swelling or redness no focal atrophy NEURO:  Oriented x3, cranial nerves 3-12 appear to be intact, no obvious focal weakness,gait within normal limits no abnormal reflexes or asymmetrical SKIN: No acute rashes normal turgor, color, no bruising or petechiae. Left breast with 2 mm dark  Palpable lesion  ? If skin lines   PSYCH: Oriented, good eye contact, no obvious depression anxiety, cognition and judgment appear normal. LN: no cervical axillary inguinal adenopathy  Lab Results  Component Value Date   WBC 5.4 08/08/2017   HGB 12.2 08/08/2017   HCT 38.1 08/08/2017   PLT 239.0 08/08/2017   GLUCOSE 92 08/08/2017   CHOL 183 08/08/2017   TRIG 142.0 08/08/2017   HDL 34.80 (L) 08/08/2017   LDLCALC 120 (H) 08/08/2017   ALT 14 08/08/2017   AST 14 08/08/2017   NA 138 08/08/2017   K 4.2 08/08/2017   CL 104 08/08/2017   CREATININE 0.75 08/08/2017   BUN 10 08/08/2017   CO2 26 08/08/2017   TSH 1.72 08/08/2017   INR 1.17 05/17/2015   HGBA1C 6.1 08/08/2017    BP Readings from Last 3 Encounters:  08/08/17 128/86  05/10/17 128/80  07/12/16 129/90    Lab results reviewed with patient   ASSESSMENT AND PLAN:  Discussed the following assessment and plan:  Visit for preventive health examination - Plan: Basic metabolic panel, CBC with Differential/Platelet, Hemoglobin A1c, Hepatic function panel, Lipid panel, TSH  Medication management - Plan: Basic metabolic panel, CBC with Differential/Platelet, Hemoglobin A1c, Hepatic function panel, Lipid panel, TSH  Iron deficiency anemia, unspecified iron deficiency anemia  type - Plan: Basic metabolic panel, CBC with Differential/Platelet, Hemoglobin A1c, Hepatic function panel, Lipid panel, TSH  Low HDL (under 40) - Plan: Basic metabolic panel, CBC with Differential/Platelet, Hemoglobin A1c, Hepatic function panel, Lipid panel, TSH  Nonintractable migraine, unspecified migraine type - stable contnue prevention  avoid dehydration slepp erratic etc   Fasting hyperglycemia - Plan: Basic metabolic panel, CBC with Differential/Platelet, Hemoglobin A1c, Hepatic function panel, Lipid panel, TSH  Need for influenza vaccination - Plan: Flu Vaccine QUAD 6+ mos PF IM (Fluarix Quad PF)  Allergic rhinoconjunctivitis, seasonal and perennial - refill meds  See derm about dark spot on leftbreast  ? Fibroma? Vs other  Uncertain if significant  Patient Care Team: Panosh, Standley Brooking, MD as PCP - General (Internal Medicine) Jarome Matin, MD as Consulting Physician (Dermatology) Lavonia Drafts, MD as Consulting Physician (Obstetrics and Gynecology) Patient Instructions  Will notify you  of labs when available. If all ok then check up in a year   Healthy lifestyle includes : At least 150 minutes of exercise weeks  , weight at healthy levels, which is usually   BMI 19-25. Avoid trans fats and processed foods;  Increase fresh fruits and veges to 5 servings per day. And avoid sweet beverages including tea and juice. Mediterranean diet with olive oil and nuts have been noted to be heart and brain healthy . Avoid tobacco products . Limit  alcohol to  7 per week for women and 14 servings for men.  Get adequate sleep . Wear seat belts . Don't text and drive .     Preventive Care 18-39 Years, Female Preventive care refers to lifestyle choices and visits with your health care provider that can promote health and wellness. What does preventive care include?  A yearly physical exam. This is also called an annual well check.  Dental exams once or twice a year.  Routine eye  exams. Ask your health care provider how often you should have your eyes checked.  Personal lifestyle choices, including: ? Daily care of your teeth and gums. ? Regular physical activity. ? Eating a healthy diet. ? Avoiding tobacco and drug use. ? Limiting alcohol use. ? Practicing safe sex. ? Taking vitamin and mineral supplements as recommended by your health care provider. What happens during an annual well check? The services and screenings done by your health care provider during your annual well check will depend on your age, overall health, lifestyle risk factors, and family history of disease. Counseling Your health care provider may ask you questions about your:  Alcohol use.  Tobacco use.  Drug use.  Emotional well-being.  Home and relationship well-being.  Sexual activity.  Eating habits.  Work and work Statistician.  Method of birth control.  Menstrual cycle.  Pregnancy history.  Screening You may have the following tests or measurements:  Height, weight, and BMI.  Diabetes screening. This is done by checking your blood sugar (glucose) after you have not eaten for a while (fasting).  Blood pressure.  Lipid and cholesterol levels. These may be checked every 5 years starting at age 55.  Skin check.  Hepatitis C blood test.  Hepatitis B blood test.  Sexually transmitted disease (STD) testing.  BRCA-related cancer screening. This may be done if you have a family history of breast, ovarian, tubal, or peritoneal cancers.  Pelvic exam and Pap test. This may be done every 3 years starting at age 25. Starting at age 34, this may be done every 5 years if you have a Pap test in combination with an HPV test.  Discuss your test results, treatment options, and if necessary, the need for more tests with your health care provider. Vaccines Your health care provider may recommend certain vaccines, such as:  Influenza vaccine. This is recommended every  year.  Tetanus, diphtheria, and acellular pertussis (Tdap, Td) vaccine. You may need a Td booster every 10 years.  Varicella vaccine. You may need this if you have not been vaccinated.  HPV vaccine. If you are 25 or younger, you may need three doses over 6 months.  Measles, mumps, and rubella (MMR) vaccine. You may need at least one dose of MMR. You may also need a second dose.  Pneumococcal 13-valent conjugate (PCV13) vaccine. You may need this if you have certain conditions and were not previously vaccinated.  Pneumococcal polysaccharide (PPSV23) vaccine. You may need one or two doses if you smoke cigarettes or if you have certain conditions.  Meningococcal vaccine. One dose is recommended if you are age 64-21 years and a first-year college student living in a residence hall, or if you have one of several medical conditions. You may also need additional booster doses.  Hepatitis A vaccine. You may need this if you have certain conditions or if you travel or work in places where you may be exposed to hepatitis A.  Hepatitis B vaccine. You may need this if you have certain conditions or if you travel or work in places where you may be exposed to hepatitis B.  Haemophilus influenzae type b (Hib) vaccine. You may need this if you have certain risk factors.  Talk to your health care provider about which screenings and vaccines you need and how often you need them. This information is not  intended to replace advice given to you by your health care provider. Make sure you discuss any questions you have with your health care provider. Document Released: 11/07/2001 Document Revised: 05/31/2016 Document Reviewed: 07/13/2015 Elsevier Interactive Patient Education  2017 [place] K. Panosh M.D.

## 2017-08-08 ENCOUNTER — Ambulatory Visit (INDEPENDENT_AMBULATORY_CARE_PROVIDER_SITE_OTHER): Payer: 59 | Admitting: Internal Medicine

## 2017-08-08 VITALS — BP 128/86 | HR 72 | Temp 98.5°F | Ht 61.5 in | Wt 197.3 lb

## 2017-08-08 DIAGNOSIS — D509 Iron deficiency anemia, unspecified: Secondary | ICD-10-CM

## 2017-08-08 DIAGNOSIS — H1010 Acute atopic conjunctivitis, unspecified eye: Secondary | ICD-10-CM | POA: Diagnosis not present

## 2017-08-08 DIAGNOSIS — Z79899 Other long term (current) drug therapy: Secondary | ICD-10-CM

## 2017-08-08 DIAGNOSIS — E786 Lipoprotein deficiency: Secondary | ICD-10-CM | POA: Diagnosis not present

## 2017-08-08 DIAGNOSIS — R7301 Impaired fasting glucose: Secondary | ICD-10-CM | POA: Diagnosis not present

## 2017-08-08 DIAGNOSIS — Z0000 Encounter for general adult medical examination without abnormal findings: Secondary | ICD-10-CM

## 2017-08-08 DIAGNOSIS — G43009 Migraine without aura, not intractable, without status migrainosus: Secondary | ICD-10-CM | POA: Diagnosis not present

## 2017-08-08 DIAGNOSIS — J3089 Other allergic rhinitis: Secondary | ICD-10-CM

## 2017-08-08 DIAGNOSIS — J302 Other seasonal allergic rhinitis: Secondary | ICD-10-CM | POA: Diagnosis not present

## 2017-08-08 DIAGNOSIS — Z23 Encounter for immunization: Secondary | ICD-10-CM

## 2017-08-08 LAB — BASIC METABOLIC PANEL
BUN: 10 mg/dL (ref 6–23)
CALCIUM: 9.5 mg/dL (ref 8.4–10.5)
CO2: 26 mEq/L (ref 19–32)
Chloride: 104 mEq/L (ref 96–112)
Creatinine, Ser: 0.75 mg/dL (ref 0.40–1.20)
GFR: 94.57 mL/min (ref >60.00)
GLUCOSE: 92 mg/dL (ref 70–99)
Potassium: 4.2 mEq/L (ref 3.5–5.1)
SODIUM: 138 meq/L (ref 135–145)

## 2017-08-08 LAB — LIPID PANEL
CHOL/HDL RATIO: 5
Cholesterol: 183 mg/dL (ref 0–200)
HDL: 34.8 mg/dL — AB (ref >39.00)
LDL CALC: 120 mg/dL — AB (ref 0–99)
NonHDL: 148.24
Triglycerides: 142 mg/dL (ref 0.0–149.0)
VLDL: 28.4 mg/dL (ref 0.0–40.0)

## 2017-08-08 LAB — CBC WITH DIFFERENTIAL/PLATELET
BASOS ABS: 0 10*3/uL (ref 0.0–0.1)
Basophils Relative: 0.8 % (ref 0.0–3.0)
Eosinophils Absolute: 0.1 10*3/uL (ref 0.0–0.7)
Eosinophils Relative: 2.3 % (ref 0.0–5.0)
HCT: 38.1 % (ref 36.0–46.0)
Hemoglobin: 12.2 g/dL (ref 12.0–15.0)
LYMPHS ABS: 2.2 10*3/uL (ref 0.7–4.0)
Lymphocytes Relative: 41.1 % (ref 12.0–46.0)
MCHC: 32 g/dL (ref 30.0–36.0)
MCV: 79.1 fl (ref 78.0–100.0)
MONO ABS: 0.4 10*3/uL (ref 0.1–1.0)
MONOS PCT: 8.2 % (ref 3.0–12.0)
NEUTROS PCT: 47.6 % (ref 43.0–77.0)
Neutro Abs: 2.6 10*3/uL (ref 1.4–7.7)
Platelets: 239 10*3/uL (ref 150.0–400.0)
RBC: 4.81 Mil/uL (ref 3.87–5.11)
RDW: 14.6 % (ref 11.5–15.5)
WBC: 5.4 10*3/uL (ref 4.0–10.5)

## 2017-08-08 LAB — HEPATIC FUNCTION PANEL
ALK PHOS: 61 U/L (ref 39–117)
ALT: 14 U/L (ref 0–35)
AST: 14 U/L (ref 0–37)
Albumin: 4.2 g/dL (ref 3.5–5.2)
BILIRUBIN DIRECT: 0.1 mg/dL (ref 0.0–0.3)
BILIRUBIN TOTAL: 0.5 mg/dL (ref 0.2–1.2)
Total Protein: 7.7 g/dL (ref 6.0–8.3)

## 2017-08-08 LAB — HEMOGLOBIN A1C: HEMOGLOBIN A1C: 6.1 % (ref 4.6–6.5)

## 2017-08-08 LAB — TSH: TSH: 1.72 u[IU]/mL (ref 0.35–4.50)

## 2017-08-08 MED ORDER — FLUNISOLIDE 25 MCG/ACT (0.025%) NA SOLN: NASAL | 11 refills | Status: DC

## 2017-08-08 NOTE — Patient Instructions (Addendum)
Will notify you  of labs when available. If all ok then check up in a year   Healthy lifestyle includes : At least 150 minutes of exercise weeks  , weight at healthy levels, which is usually   BMI 19-25. Avoid trans fats and processed foods;  Increase fresh fruits and veges to 5 servings per day. And avoid sweet beverages including tea and juice. Mediterranean diet with olive oil and nuts have been noted to be heart and brain healthy . Avoid tobacco products . Limit  alcohol to  7 per week for women and 14 servings for men.  Get adequate sleep . Wear seat belts . Don't text and drive .     Preventive Care 18-39 Years, Female Preventive care refers to lifestyle choices and visits with your health care provider that can promote health and wellness. What does preventive care include?  A yearly physical exam. This is also called an annual well check.  Dental exams once or twice a year.  Routine eye exams. Ask your health care provider how often you should have your eyes checked.  Personal lifestyle choices, including: ? Daily care of your teeth and gums. ? Regular physical activity. ? Eating a healthy diet. ? Avoiding tobacco and drug use. ? Limiting alcohol use. ? Practicing safe sex. ? Taking vitamin and mineral supplements as recommended by your health care provider. What happens during an annual well check? The services and screenings done by your health care provider during your annual well check will depend on your age, overall health, lifestyle risk factors, and family history of disease. Counseling Your health care provider may ask you questions about your:  Alcohol use.  Tobacco use.  Drug use.  Emotional well-being.  Home and relationship well-being.  Sexual activity.  Eating habits.  Work and work Statistician.  Method of birth control.  Menstrual cycle.  Pregnancy history.  Screening You may have the following tests or measurements:  Height,  weight, and BMI.  Diabetes screening. This is done by checking your blood sugar (glucose) after you have not eaten for a while (fasting).  Blood pressure.  Lipid and cholesterol levels. These may be checked every 5 years starting at age 24.  Skin check.  Hepatitis C blood test.  Hepatitis B blood test.  Sexually transmitted disease (STD) testing.  BRCA-related cancer screening. This may be done if you have a family history of breast, ovarian, tubal, or peritoneal cancers.  Pelvic exam and Pap test. This may be done every 3 years starting at age 12. Starting at age 39, this may be done every 5 years if you have a Pap test in combination with an HPV test.  Discuss your test results, treatment options, and if necessary, the need for more tests with your health care provider. Vaccines Your health care provider may recommend certain vaccines, such as:  Influenza vaccine. This is recommended every year.  Tetanus, diphtheria, and acellular pertussis (Tdap, Td) vaccine. You may need a Td booster every 10 years.  Varicella vaccine. You may need this if you have not been vaccinated.  HPV vaccine. If you are 53 or younger, you may need three doses over 6 months.  Measles, mumps, and rubella (MMR) vaccine. You may need at least one dose of MMR. You may also need a second dose.  Pneumococcal 13-valent conjugate (PCV13) vaccine. You may need this if you have certain conditions and were not previously vaccinated.  Pneumococcal polysaccharide (PPSV23) vaccine. You may need one  or two doses if you smoke cigarettes or if you have certain conditions.  Meningococcal vaccine. One dose is recommended if you are age 28-21 years and a first-year college student living in a residence hall, or if you have one of several medical conditions. You may also need additional booster doses.  Hepatitis A vaccine. You may need this if you have certain conditions or if you travel or work in places where you may  be exposed to hepatitis A.  Hepatitis B vaccine. You may need this if you have certain conditions or if you travel or work in places where you may be exposed to hepatitis B.  Haemophilus influenzae type b (Hib) vaccine. You may need this if you have certain risk factors.  Talk to your health care provider about which screenings and vaccines you need and how often you need them. This information is not intended to replace advice given to you by your health care provider. Make sure you discuss any questions you have with your health care provider. Document Released: 11/07/2001 Document Revised: 05/31/2016 Document Reviewed: 07/13/2015 Elsevier Interactive Patient Education  2017 [employer].

## 2017-08-08 NOTE — Addendum Note (Signed)
Addended by: Maisie FusGREEN, Reynold Mantell M on: 08/08/2017 04:06 PM   Modules accepted: Orders

## 2017-08-08 NOTE — Addendum Note (Signed)
Addended by: Maisie FusGREEN, Emina Ribaudo M on: 08/08/2017 04:11 PM   Modules accepted: Orders

## 2017-08-09 ENCOUNTER — Ambulatory Visit (INDEPENDENT_AMBULATORY_CARE_PROVIDER_SITE_OTHER): Payer: 59

## 2017-08-09 DIAGNOSIS — Z3042 Encounter for surveillance of injectable contraceptive: Secondary | ICD-10-CM

## 2017-08-09 NOTE — Progress Notes (Signed)
Patient getting Depo Provera today and then will schedule her annual exam today. Cathy StammerJennifer Deroy [name]

## 2017-08-15 IMAGING — US US PELVIS COMPLETE
1 series · 13 of 25 positions shown · non-contrast
Comparison: CT abdomen pelvis dated 05/13/2015

CLINICAL DATA: Tubo-ovarian abscess on CT

EXAM:
TRANSABDOMINAL AND TRANSVAGINAL ULTRASOUND OF PELVIS
TECHNIQUE: Both transabdominal and transvaginal ultrasound examinations of the
pelvis were performed. Transabdominal technique was performed for
global imaging of the pelvis including uterus, ovaries, adnexal
regions, and pelvic cul-de-sac. It was necessary to proceed with
endovaginal exam following the transabdominal exam to visualize the
bilateral adnexa.

[Series 1: us pelvis complete · 74 acquisitions, 13 frames shown]
[im 1/74]
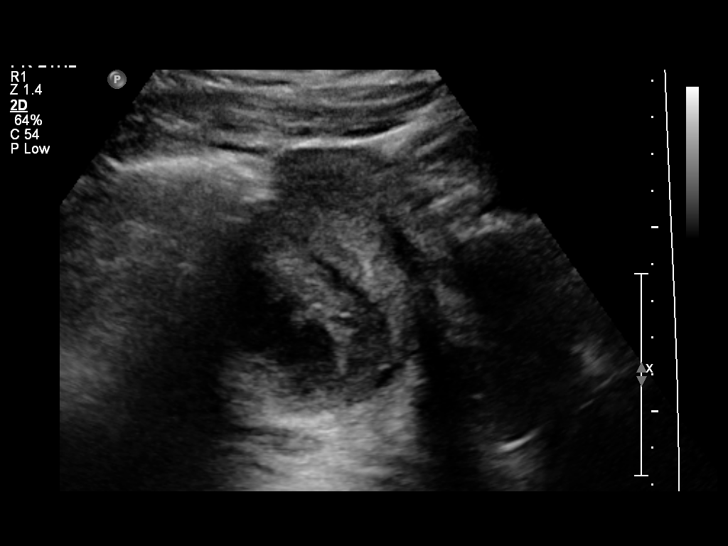
[im 7/74]
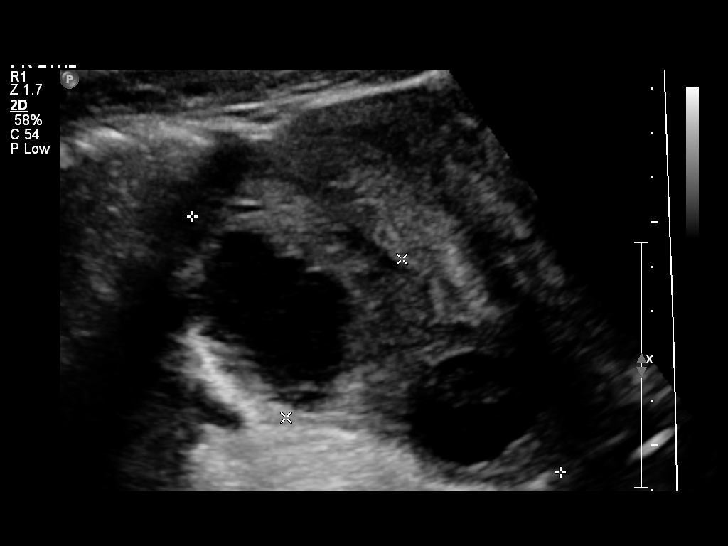
[im 13/74]
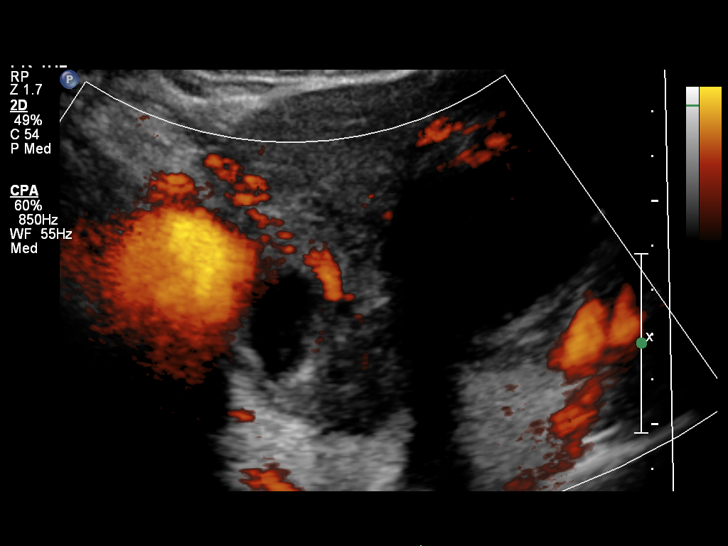
[im 19/74]
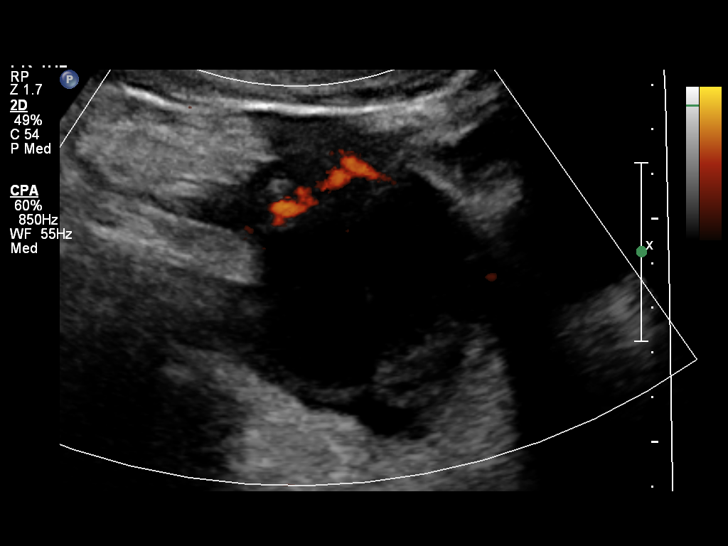
[im 25/74]
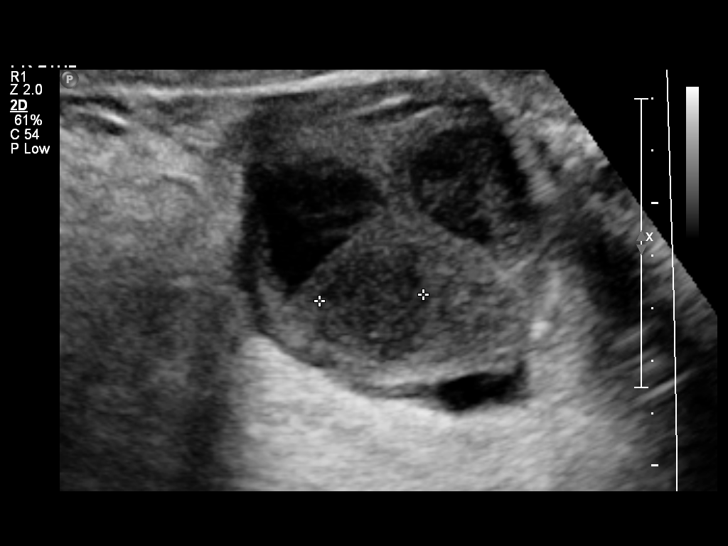
[im 31/74]
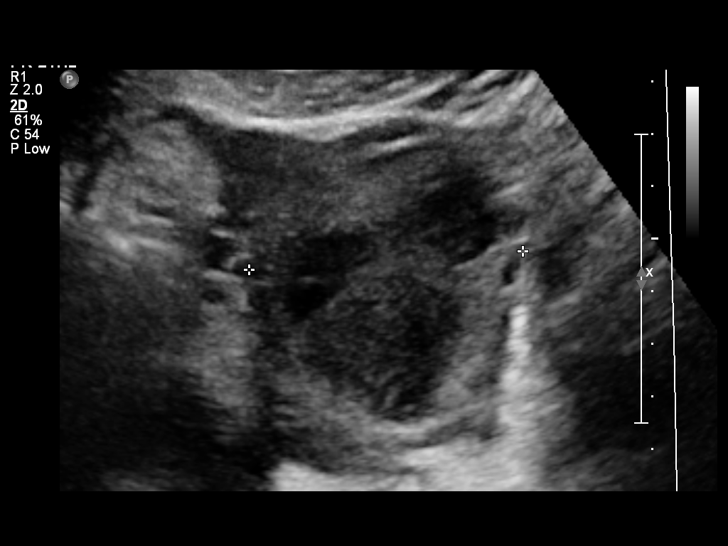
[im 37/74]
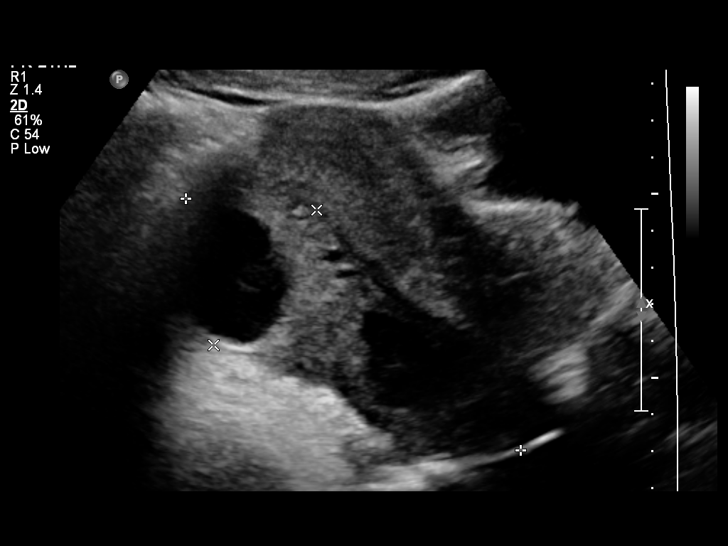
[im 43/74]
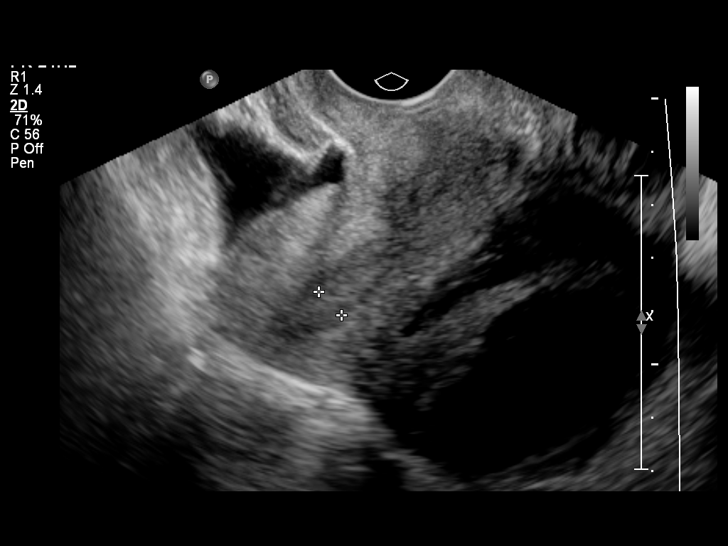
[im 49/74]
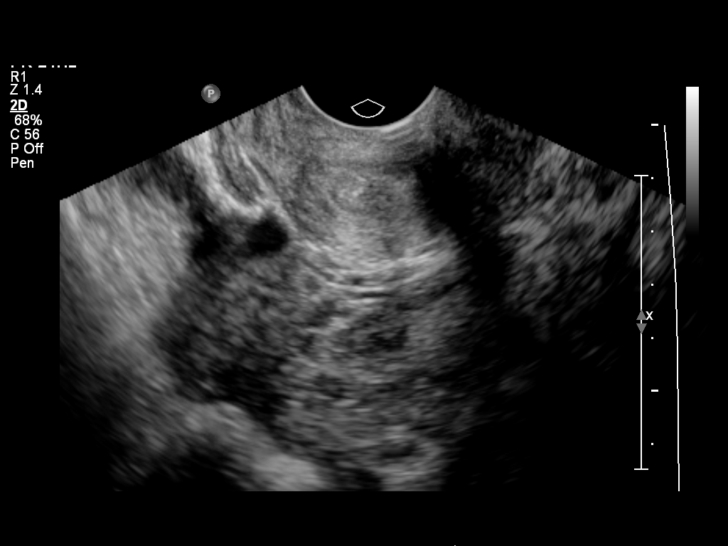
[im 55/74]
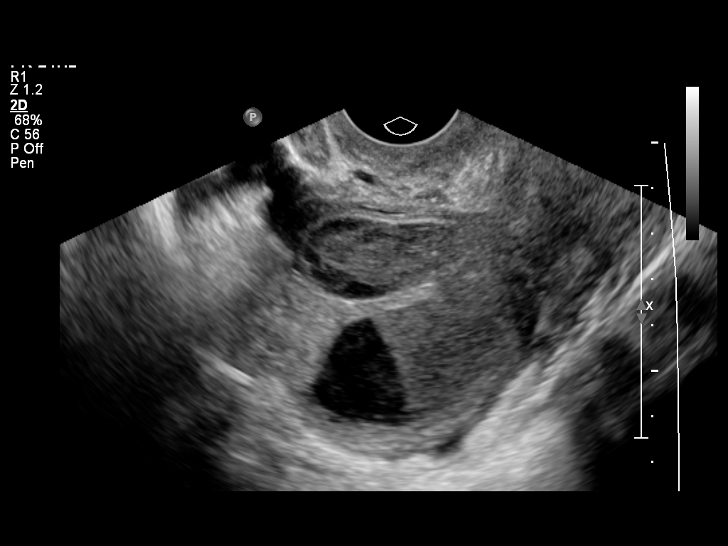
[im 61/74]
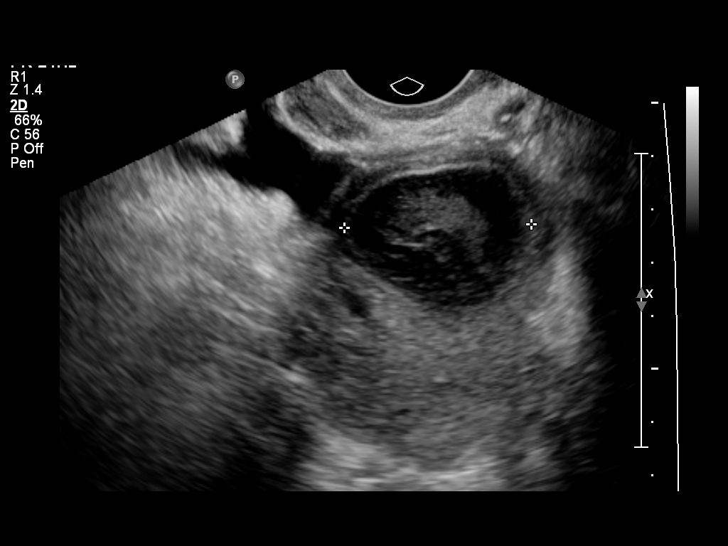
[im 67/74]
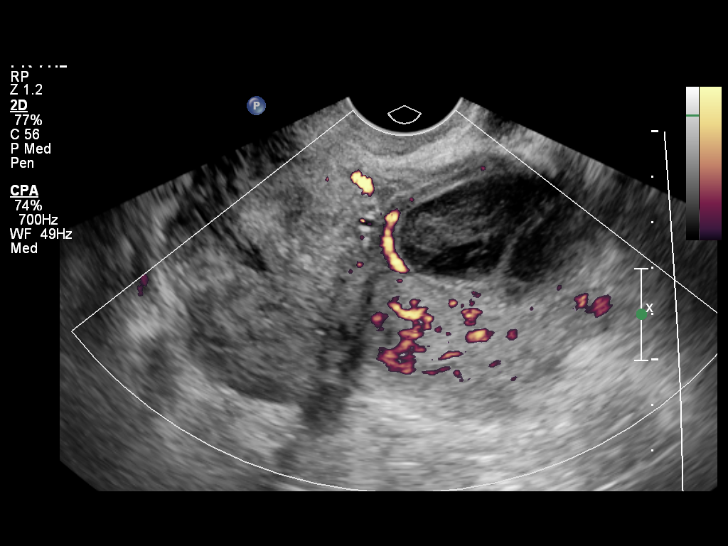
[im 74/74]
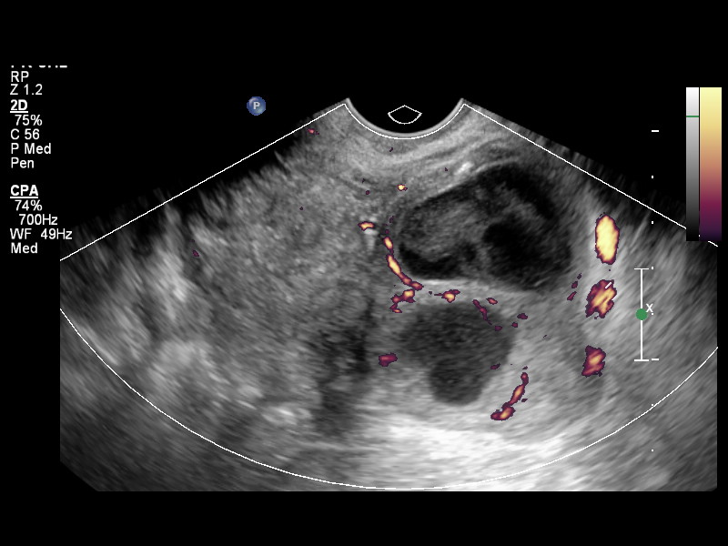

[13 of 25 positions shown; findings below may reference images not displayed]

FINDINGS: Uterus

Measurements: 7.9 x 3.3 x 4.3 cm. No fibroids or other mass
visualized.

Endometrium

Thickness: 6 mm.  No focal abnormality visualized.

Right ovary

Not discretely visualized. Complex right adnexal mass measuring
x 6.1 x 5.4 cm with dilated cystic tubular component.

Left ovary

Not discretely visualized. Complex left adnexal mass measuring 6.4 x
5.8 x 5.3 cm with multiple cystic components, many of which are not
simple.

Other findings

No free fluid.
IMPRESSION: Complex right adnexal mass measuring up to 11.4 cm with dilated
cystic tubular component.

Complex left adnexal mass measuring up to 6.4 cm with multiple
cystic components.

These findings are compatible with bilateral tubo-ovarian abscesses.

## 2017-08-18 IMAGING — CT CT IMAGE GUIDED DRAINAGE BY PERCUTANEOUS CATHETER
2 of 3 series · 13 of 32 positions shown, 18 images · non-contrast
Comparison: none

CLINICAL DATA: Multilocular bilateral tubo-ovarian abscess on
recent CT. Percutaneous drainage is requested.

[Series 2: i-spiral 5.0 b40f · axial · 0.79mm/px · z∈[+720,+846]mm · 7 of 48 slices shown, 12 images]
[im 6/48  soft-tissue]
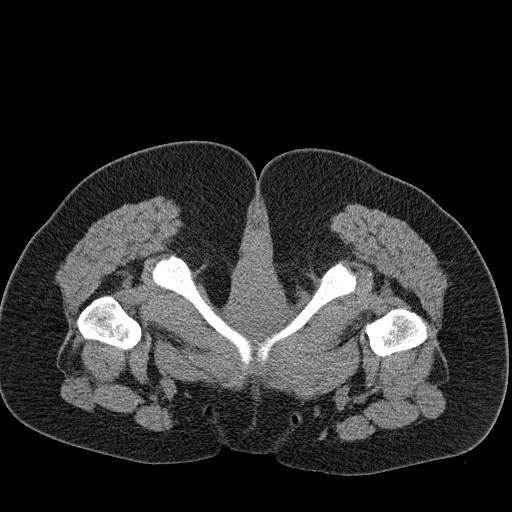
[im 6/48  bone]
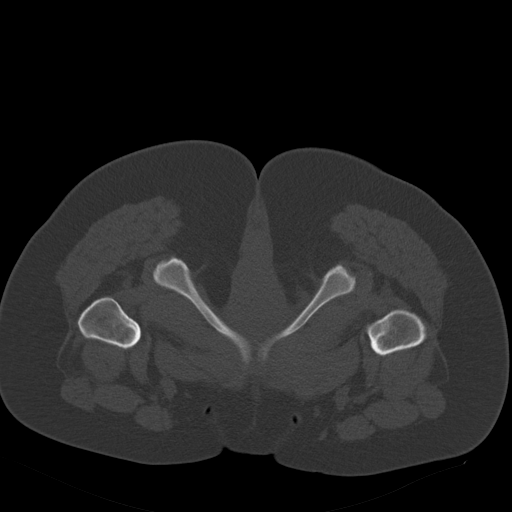
[im 12/48  soft-tissue]
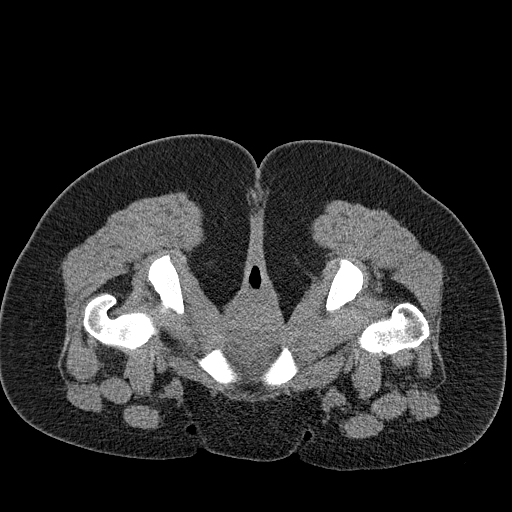
[im 18/48  soft-tissue]
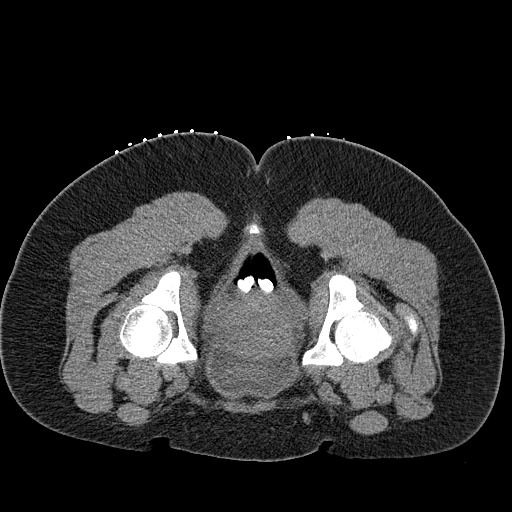
[im 24/48  soft-tissue]
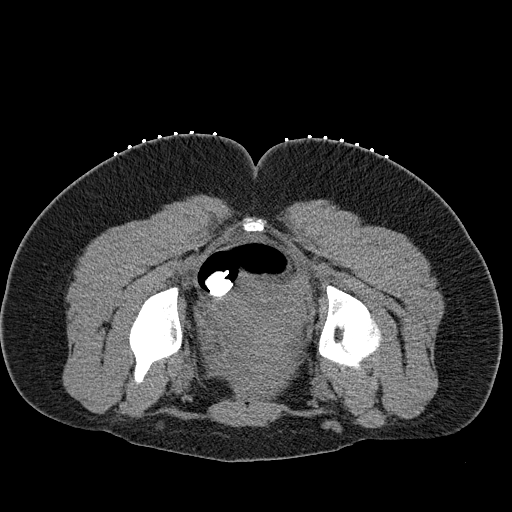
[im 24/48  lung]
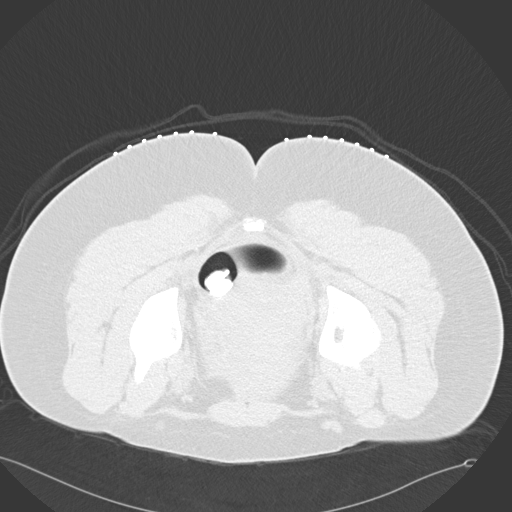
[im 30/48  soft-tissue]
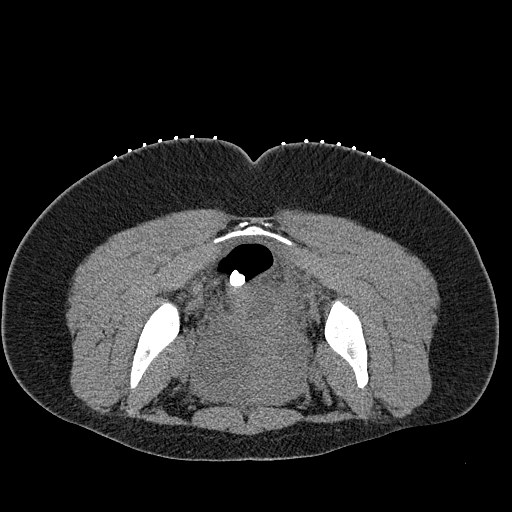
[im 30/48  lung]
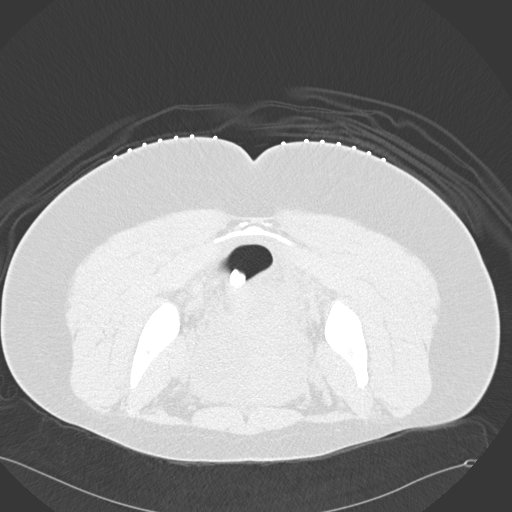
[im 36/48  soft-tissue]
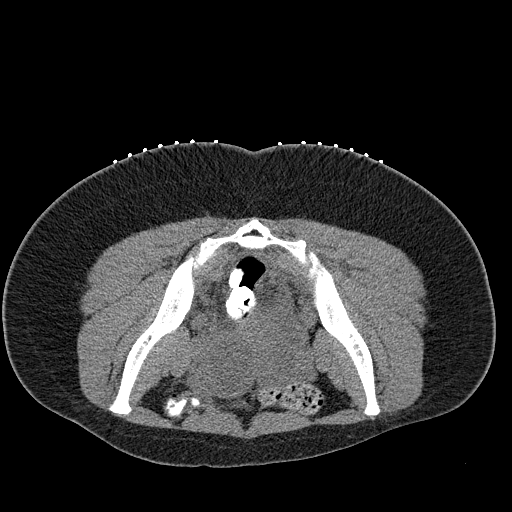
[im 36/48  lung]
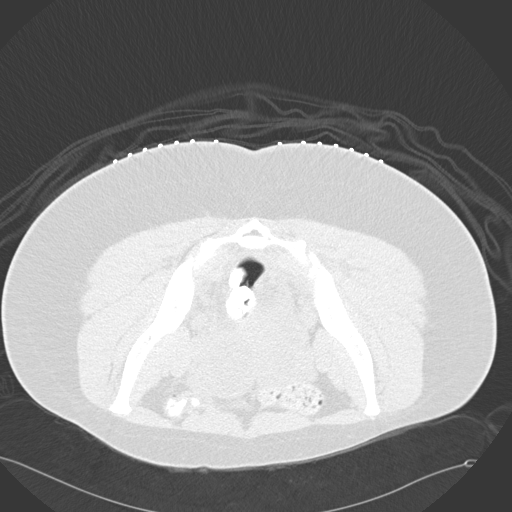
[im 42/48  soft-tissue]
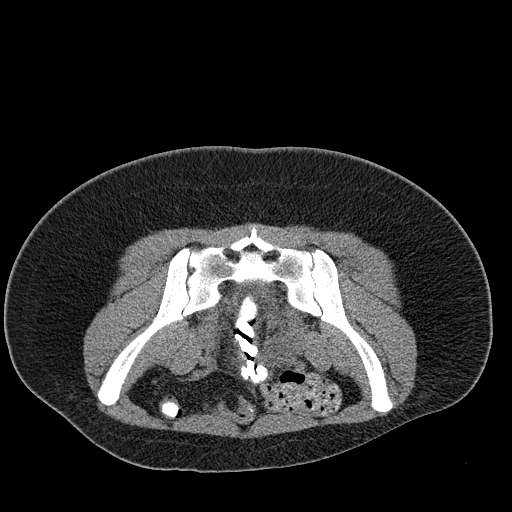
[im 42/48  lung]
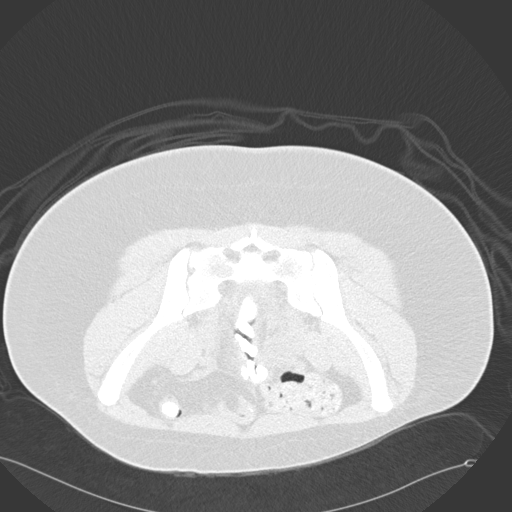

[Series 4: i-sequence 4.8 b40s · axial · 0.79mm/px · z∈[+795,+818]mm · 6 of 72 slices shown]
[im 7/72  soft-tissue]
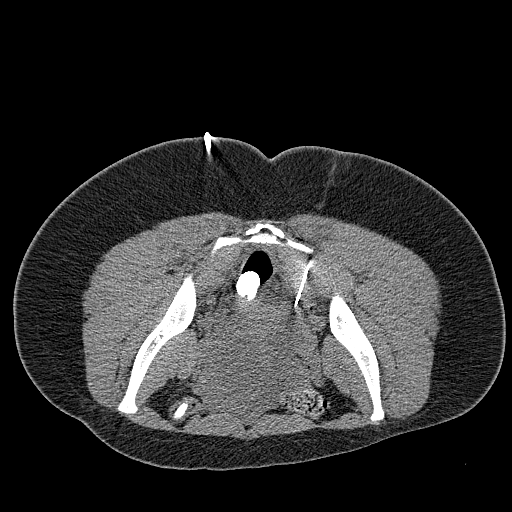
[im 13/72  soft-tissue]
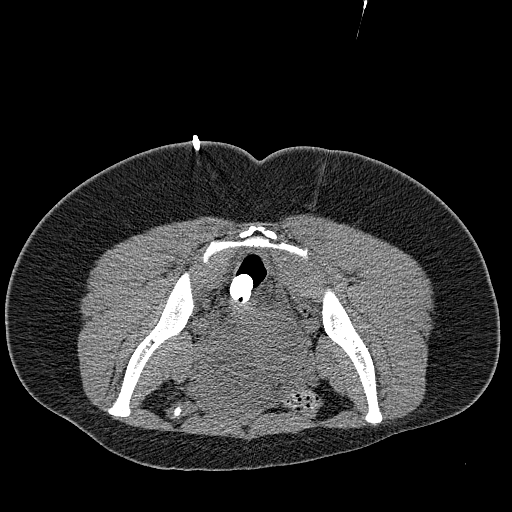
[im 26/72  soft-tissue]
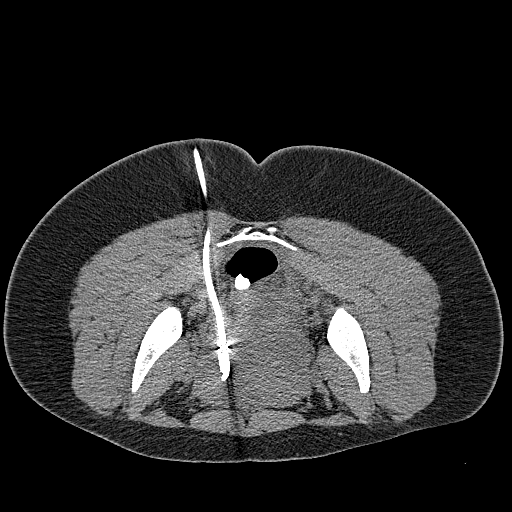
[im 33/72  soft-tissue]
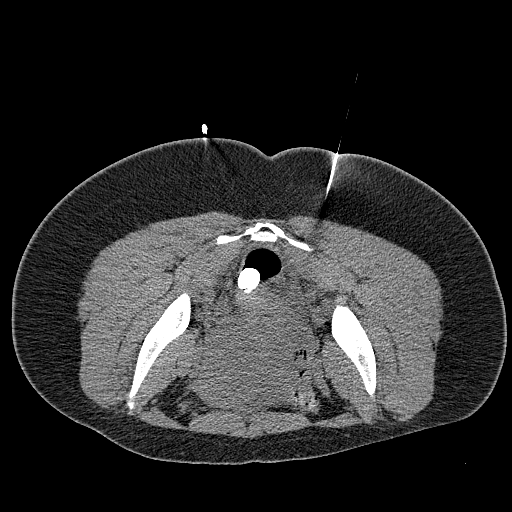
[im 39/72  soft-tissue]
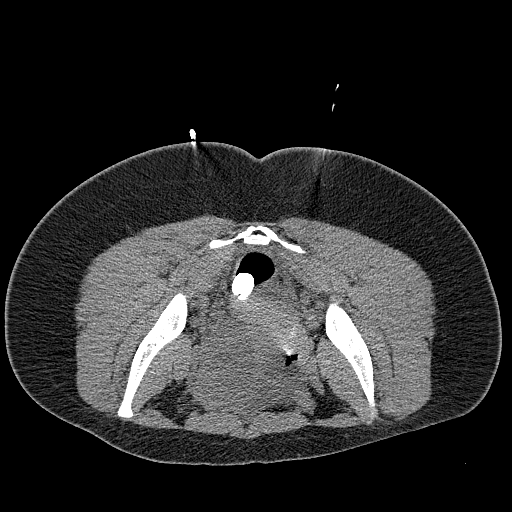
[im 46/72  soft-tissue]
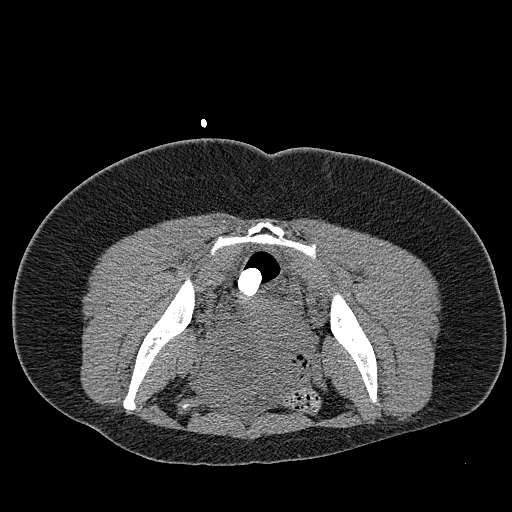

[13 of 32 positions shown; findings below may reference images not displayed]

EXAM:
CT GUIDED DRAINAGE OF LEFT TUBO-OVARIAN ABSCESS

CT GUIDED ASPIRATION (ATTEMPTED)

ANESTHESIA/SEDATION:
Intravenous Fentanyl and Versed were administered as conscious
sedation during continuous cardiorespiratory monitoring by the
radiology RN, with a total moderate sedation time of 40 minutes.

PROCEDURE:
The procedure, risks, benefits, and alternatives were explained to
the patient. Questions regarding the procedure were encouraged and
answered. The patient understands and consents to the procedure.

Patient placed prone. Select axial scans through the pelvis were
obtained an the pelvic collections seen on previous CT were
localized, and skin entry sites marked.

The operative field was prepped with chlorhexidinein a sterile
fashion, and a sterile drape was applied covering the operative
field. A sterile gown and sterile gloves were used for the
procedure. Local anesthesia was provided with 1% Lidocaine.

Under CT fluoroscopic guidance, an 18 gauge trocar needle was
advanced into the dominant left pelvic collection. Purulent fluid
could be aspirated. An Amplatz guidewire advanced easily within the
collection, confirmed on CT. Tract dilated to facilitate placement
of a 12 French pigtail catheter, placed centrally within the
collection, position confirmed on CT. In similar fashion, from a
right-sided approach an 18 gauge trocar needs what was advanced
towards the smaller right pelvic collections, only vaguely
visualized on this noncontrast study. No significant fluid could be
aspirated on multiple passes.

The left drain catheter was secured externally with 0 Prolene suture
and StatLock and placed to gravity bag. Approximately 25 mL of
somewhat bloody purulent material were aspirated, a sample sent for
Gram stain and culture. The patient tolerated the procedure well.

COMPLICATIONS:
None immediate
FINDINGS: Low-attenuation bilateral pelvic collections were noted on
localizing CT. 12 French pigtail catheter placed in the dominant
left collection. Aspiration of right-sided collections returned no
significant fluid. Follow-up scans reveal no hemorrhage or other
apparent complication .
IMPRESSION: 1. Technically successful CT-guided drain catheter placement into
dominant left pelvic collection. A sample of the aspirate was sent
for Gram stain and culture.
2. Attempted aspiration of smaller right-sided collections returned
no significant fluid.

## 2017-09-13 ENCOUNTER — Ambulatory Visit (INDEPENDENT_AMBULATORY_CARE_PROVIDER_SITE_OTHER): Payer: 59 | Admitting: Obstetrics & Gynecology

## 2017-09-13 ENCOUNTER — Encounter: Payer: Self-pay | Admitting: Obstetrics & Gynecology

## 2017-09-13 VITALS — BP 125/85 | HR 89 | Ht 61.0 in | Wt 196.0 lb

## 2017-09-13 DIAGNOSIS — Z01419 Encounter for gynecological examination (general) (routine) without abnormal findings: Secondary | ICD-10-CM

## 2017-09-13 NOTE — Progress Notes (Signed)
Subjective:     [name] is a 31 y.o. female here for a routine exam. G0  LMP no menses on Depo Current complaints: none.     Gynecologic History No LMP recorded. Contraception: Depo-Provera injections Last Pap: 07/12/2016. Results were: normal Last mammogram: n/a   Obstetric History OB History  Gravida Para Term Preterm AB Living  0 0 0 0 0 0  SAB TAB Ectopic Multiple Live Births  0 0 0 0          The following portions of the patient's history were reviewed and updated as appropriate: allergies, current medications, past family history, past medical history, past social history, past surgical history and problem list.  Review of Systems Pertinent items are noted in HPI.    Objective:  BP 125/85   Pulse 89   Ht 5\' 1"  (1.549 m)   Wt 196 lb (88.9 kg)   BMI 37.03 kg/m   General Appearance:    Alert, cooperative, no distress, appears stated age  Head:    Normocephalic, without obvious abnormality, atraumatic  Eyes:    conjunctiva/corneas clear, EOM's intact, both eyes  Ears:    Normal external ear canals, both ears  Nose:   Nares normal, septum midline, mucosa normal, no drainage    or sinus tenderness  Throat:   Lips, mucosa, and tongue normal; teeth and gums normal  Neck:   Supple, symmetrical, trachea midline, no adenopathy;    thyroid:  no enlargement/tenderness/nodules  Back:     Symmetric, no curvature, ROM normal, no CVA tenderness  Lungs:     Clear to auscultation bilaterally, respirations unlabored  Chest Wall:    No tenderness or deformity   Heart:    Regular rate and rhythm, S1 and S2 normal, no murmur, rub   or gallop  Breast Exam:    No tenderness, masses, or nipple abnormality  Abdomen:     Soft, non-tender, bowel sounds active all four quadrants,    no masses, no organomegaly  Genitalia:    Normal female without lesion, discharge or tenderness     Extremities:   Extremities normal, atraumatic, no cyanosis or edema  Pulses:   2+ and symmetric all  extremities  Skin:   Skin color, texture, turgor normal, no rashes or lesions; tattoo on left foot.      Assessment:    Healthy female exam.   Thyroid slightly enlarged.  TSH WNL 07/2017  Reviewed risks of long term Depo Provera use No PAP indicateted today   Plan:    Follow up in: 1 year.    Cont Ca++ and vit D Depo Provera 150mg  IM q 12 weeks   [name], M.D., Evern CoreFACOG

## 2017-09-13 NOTE — Patient Instructions (Signed)

## 2017-09-13 NOTE — Addendum Note (Signed)
Addended by: Kathie DikeSOLA, Denetra Formoso J on: 09/13/2017 08:57 AM   Modules accepted: Orders

## 2017-10-19 ENCOUNTER — Other Ambulatory Visit: Payer: Self-pay | Admitting: Internal Medicine

## 2017-10-25 ENCOUNTER — Other Ambulatory Visit (INDEPENDENT_AMBULATORY_CARE_PROVIDER_SITE_OTHER): Payer: 59 | Admitting: *Deleted

## 2017-10-25 VITALS — BP 117/84 | HR 75

## 2017-10-25 DIAGNOSIS — Z3042 Encounter for surveillance of injectable contraceptive: Secondary | ICD-10-CM

## 2017-10-25 NOTE — Progress Notes (Signed)
Pt is in office today for depo injection.  Pt is on time for injection.  Injection given, pt tolerated well.  Pt advised to RTO on 01-10-18 for next depo.  BP 117/84   Pulse 75    Administrations This Visit    medroxyPROGESTERone (DEPO-PROVERA) injection 150 mg    Admin Date 10/25/2017 Action Given Dose 150 mg Route Intramuscular Administered By Lanney GinsFoster, Kahlel Peake D, CMA

## 2017-10-25 NOTE — Progress Notes (Signed)
Chart reviewed. Agree with documentation by CMA.

## 2017-11-13 ENCOUNTER — Other Ambulatory Visit: Payer: Self-pay | Admitting: Internal Medicine

## 2017-12-05 ENCOUNTER — Other Ambulatory Visit: Payer: Self-pay | Admitting: Internal Medicine

## 2017-12-31 ENCOUNTER — Other Ambulatory Visit: Payer: Self-pay | Admitting: Internal Medicine

## 2018-01-02 ENCOUNTER — Other Ambulatory Visit: Payer: Self-pay | Admitting: Internal Medicine

## 2018-01-10 ENCOUNTER — Other Ambulatory Visit (INDEPENDENT_AMBULATORY_CARE_PROVIDER_SITE_OTHER): Payer: Managed Care, Other (non HMO)

## 2018-01-10 DIAGNOSIS — Z3042 Encounter for surveillance of injectable contraceptive: Secondary | ICD-10-CM

## 2018-01-10 NOTE — Progress Notes (Signed)
Patient presents for Depo Provera injection. Annual up to date. Patient supplied medicaiton.  Armandina StammerJennifer Shyquan Stallbaumer RN

## 2018-02-15 ENCOUNTER — Other Ambulatory Visit: Payer: Self-pay | Admitting: Internal Medicine

## 2018-03-18 ENCOUNTER — Other Ambulatory Visit: Payer: Self-pay | Admitting: Internal Medicine

## 2018-03-20 NOTE — Telephone Encounter (Signed)
Sent to the pharmacy by e-scribe. 

## 2018-03-26 ENCOUNTER — Other Ambulatory Visit: Payer: Self-pay | Admitting: Internal Medicine

## 2018-04-11 ENCOUNTER — Other Ambulatory Visit (INDEPENDENT_AMBULATORY_CARE_PROVIDER_SITE_OTHER): Payer: Managed Care, Other (non HMO)

## 2018-04-11 ENCOUNTER — Other Ambulatory Visit: Payer: Self-pay | Admitting: Family Medicine

## 2018-04-11 DIAGNOSIS — Z3042 Encounter for surveillance of injectable contraceptive: Secondary | ICD-10-CM | POA: Diagnosis not present

## 2018-04-11 MED ORDER — MEDROXYPROGESTERONE ACETATE 150 MG/ML IM SUSP: 150.0000 mg | INTRAMUSCULAR | 2 refills | Status: DC

## 2018-04-11 NOTE — Progress Notes (Signed)
Patient scheduled for next injection in 3 months. Patient is not due for annual until the end of Dec. 2019.Patient has 1 refill left.  Armandina StammerJennifer Montserrath Madding

## 2018-04-11 NOTE — Progress Notes (Signed)
refilled 

## 2018-05-24 ENCOUNTER — Other Ambulatory Visit: Payer: Self-pay | Admitting: Internal Medicine

## 2018-07-11 ENCOUNTER — Ambulatory Visit (INDEPENDENT_AMBULATORY_CARE_PROVIDER_SITE_OTHER): Payer: Managed Care, Other (non HMO)

## 2018-07-11 VITALS — BP 109/70 | HR 78 | Ht 61.0 in | Wt 204.1 lb

## 2018-07-11 DIAGNOSIS — Z23 Encounter for immunization: Secondary | ICD-10-CM

## 2018-07-11 DIAGNOSIS — Z3042 Encounter for surveillance of injectable contraceptive: Secondary | ICD-10-CM | POA: Diagnosis not present

## 2018-07-11 NOTE — Progress Notes (Addendum)
[name] here for Depo-Provera  Injection.  Injection administered without complication. Patient will return in 3 months for next injection.  chiquita l wilson, CMA 07/11/2018  9:29 AM  Attestation of Attending Supervision of RN: Evaluation and management procedures were performed by the nurse under my supervision and collaboration.  I have reviewed the nursing note and chart, and I agree with the management and plan.  Carolyn L. Harraway-Smith, M.D., Evern Core

## 2018-09-26 ENCOUNTER — Other Ambulatory Visit: Payer: Self-pay | Admitting: Internal Medicine

## 2018-10-04 ENCOUNTER — Encounter: Payer: Self-pay | Admitting: Obstetrics & Gynecology

## 2018-10-04 ENCOUNTER — Ambulatory Visit (INDEPENDENT_AMBULATORY_CARE_PROVIDER_SITE_OTHER): Payer: BLUE CROSS/BLUE SHIELD | Admitting: Obstetrics & Gynecology

## 2018-10-04 VITALS — BP 132/88 | HR 100 | Ht 61.0 in | Wt 205.0 lb

## 2018-10-04 DIAGNOSIS — Z113 Encounter for screening for infections with a predominantly sexual mode of transmission: Secondary | ICD-10-CM | POA: Diagnosis not present

## 2018-10-04 DIAGNOSIS — Z124 Encounter for screening for malignant neoplasm of cervix: Secondary | ICD-10-CM | POA: Diagnosis not present

## 2018-10-04 DIAGNOSIS — Z3042 Encounter for surveillance of injectable contraceptive: Secondary | ICD-10-CM

## 2018-10-04 DIAGNOSIS — Z01419 Encounter for gynecological examination (general) (routine) without abnormal findings: Secondary | ICD-10-CM

## 2018-10-04 NOTE — Progress Notes (Signed)
Subjective:     [name] is a 33 y.o. female here for a routine exam. G0 Current complaints: depo provera amenorrhea. Pt has plans to go to [place], [place] and [place] this year for travel.    Gynecologic History No LMP recorded. Contraception: Depo-Provera injections Last Pap: 07/12/2016. Results were: normal Last mammogram: n/a.   Obstetric History OB History  Gravida Para Term Preterm AB Living  0 0 0 0 0 0  SAB TAB Ectopic Multiple Live Births  0 0 0 0     The following portions of the patient's history were reviewed and updated as appropriate: allergies, current medications, past family history, past medical history, past social history, past surgical history and problem list.  Review of Systems Pertinent items are noted in HPI.    Objective:  BP 132/88   Pulse 100   Ht 5\' 1"  (1.549 m)   Wt 205 lb (93 kg)   BMI 38.73 kg/m  General Appearance:    Alert, cooperative, no distress, appears stated age  Head:    Normocephalic, without obvious abnormality, atraumatic  Eyes:    conjunctiva/corneas clear, EOM's intact, both eyes  Ears:    Normal external ear canals, both ears  Nose:   Nares normal, septum midline, mucosa normal, no drainage    or sinus tenderness  Throat:   Lips, mucosa, and tongue normal; teeth and gums normal  Neck:   Supple, symmetrical, trachea midline, no adenopathy;    thyroid:  no enlargement/tenderness/nodules  Back:     Symmetric, no curvature, ROM normal, no CVA tenderness  Lungs:     Clear to auscultation bilaterally, respirations unlabored  Chest Wall:    No tenderness or deformity   Heart:    Regular rate and rhythm, S1 and S2 normal, no murmur, rub   or gallop  Breast Exam:    No tenderness, masses, or nipple abnormality  Abdomen:     Soft, non-tender, bowel sounds active all four quadrants,    no masses, no organomegaly  Genitalia:    Normal female without lesion, discharge or tenderness     Extremities:   Extremities normal, atraumatic, no  cyanosis or edema  Pulses:   2+ and symmetric all extremities  Skin:   Skin color, texture, turgor normal, no rashes or lesions    Assessment:    Healthy female exam.   Contraception counseling    Plan:    Follow up in: 1 year.    Depo Provera today F/u PAP   [name], M.D., Evern Core

## 2018-10-04 NOTE — Patient Instructions (Signed)

## 2018-10-04 NOTE — Progress Notes (Signed)
Patient given Depo Provera. Patient supplied medication. Cathy StammerJennifer Brison Allen

## 2018-10-08 LAB — CYTOLOGY - PAP
Chlamydia: NEGATIVE
Diagnosis: NEGATIVE
NEISSERIA GONORRHEA: NEGATIVE

## 2018-10-10 ENCOUNTER — Other Ambulatory Visit: Payer: Self-pay | Admitting: Obstetrics & Gynecology

## 2018-10-10 DIAGNOSIS — B373 Candidiasis of vulva and vagina: Secondary | ICD-10-CM

## 2018-10-10 DIAGNOSIS — B3731 Acute candidiasis of vulva and vagina: Secondary | ICD-10-CM

## 2018-10-10 MED ORDER — FLUCONAZOLE 150 MG PO TABS: 150.0000 mg | ORAL_TABLET | Freq: Once | ORAL | 0 refills | Status: AC

## 2018-10-25 ENCOUNTER — Other Ambulatory Visit: Payer: Self-pay | Admitting: Internal Medicine

## 2018-11-08 ENCOUNTER — Telehealth: Payer: Self-pay

## 2018-11-08 NOTE — Telephone Encounter (Signed)
PA for flunisolide (NASALIDE) 25 MCG/ACT (0.025%) SOLN has been sent to cover my meds.   KeyVictoriano Lain - PA Case ID: [id number] - Rx #: [id number]   PA has been APPROVED.

## 2018-12-06 ENCOUNTER — Telehealth: Payer: Self-pay | Admitting: Internal Medicine

## 2018-12-06 NOTE — Telephone Encounter (Signed)
Copied from CRM [id number]. Topic: Quick Communication - Rx Refill/Question >> Dec 06, 2018  1:41 PM Lyn Hollingshead, [facility] wrote: Medication:  flunisolide (NASALIDE) 25 MCG/ACT (0.025%) SOLN  Pt states she got a letter from her insurance that this will no longer be covered and wants to know what alternatives there are. Pt can be reached at [phone]

## 2018-12-09 ENCOUNTER — Other Ambulatory Visit: Payer: Self-pay

## 2018-12-09 ENCOUNTER — Ambulatory Visit: Payer: BLUE CROSS/BLUE SHIELD | Admitting: Internal Medicine

## 2018-12-09 ENCOUNTER — Encounter: Payer: Self-pay | Admitting: Internal Medicine

## 2018-12-09 VITALS — BP 124/70 | HR 105 | Temp 98.3°F | Wt 198.7 lb

## 2018-12-09 DIAGNOSIS — R6884 Jaw pain: Secondary | ICD-10-CM | POA: Diagnosis not present

## 2018-12-09 DIAGNOSIS — H66001 Acute suppurative otitis media without spontaneous rupture of ear drum, right ear: Secondary | ICD-10-CM | POA: Diagnosis not present

## 2018-12-09 MED ORDER — AMOXICILLIN-POT CLAVULANATE 875-125 MG PO TABS: 1.0000 | ORAL_TABLET | Freq: Two times a day (BID) | ORAL | 0 refills | Status: AC

## 2018-12-09 NOTE — Progress Notes (Signed)
Acute Office Visit     CC/Reason for Visit: Right jaw pain when chewing  HPI: [name] is a 33 y.o. female who is coming in today for the above mentioned reasons.  For the past 2 weeks she has been having right jaw pain, she notices that it is especially prominent when she is chewing or when she opens her mouth wide.  She just saw her dentist in February and was due for dental x-rays and was told that everything "looked fine".  She initially had some mild right ear pain, but no discharge, no fever.  She does grind her teeth sometimes at night.  She has had no sick contacts or recent travel.  No recent upper URI symptoms.   Past Medical/Surgical History: Past Medical History:  Diagnosis Date  . Anemia, iron deficiency    With elevated hemoglobin A2  . Chicken pox   . Egg donor     Past Surgical History:  Procedure Laterality Date  . Denies surgical hx    . tuboovarian abcess  04-2015    Social History:  reports that she has never smoked. She has never used smokeless tobacco. She reports that she does not drink alcohol or use drugs.  Allergies: No Known Allergies  Family History:  Family History  Problem Relation Age of Onset  . Hyperlipidemia Mother   . Miscarriages / [place] Mother        Mother has 1 miscarriage  . Cancer Father        Lung Cancer  . Learning disabilities Sister        Sister has Cerebral Palsy and Epilepsy  . Mental retardation Sister   . Cancer Maternal Grandfather        Liver Cancer     Current Outpatient Medications:  .  fexofenadine (ALLEGRA) 180 MG tablet, Take 180 mg by mouth daily as needed for allergies. , Disp: , Rfl:  .  flunisolide (NASALIDE) 25 MCG/ACT (0.025%) SOLN, USE 2 SPRAYS IN EACH NOSTRIL TWICE DAILY, Disp: 25 mL, Rfl: 11 .  IRON PO, Take 3 tablets by mouth at bedtime., Disp: , Rfl:  .  medroxyPROGESTERone (DEPO-PROVERA) 150 MG/ML injection, Inject 1 mL (150 mg total) into the muscle every 3 (three) months.,  Disp: 1 mL, Rfl: 2 .  Polyethylene Glycol 3350 (MIRALAX PO), Take 1 Dose by mouth daily as needed (constipation)., Disp: , Rfl:  .  Simethicone (GAS-X EXTRA STRENGTH PO), Take 1 tablet by mouth every 4 (four) hours as needed (gas)., Disp: , Rfl:  .  SUMAtriptan (IMITREX) 100 MG tablet, TAKE 1 TABLET BY MOUTH AT ONSET OF MIGRAINE. CAN REPEAT IN 2 HOURS AS NEEDED, Disp: 9 tablet, Rfl: 0 .  topiramate (TOPAMAX) 50 MG tablet, TAKE 1 TABLET(50 MG) BY MOUTH TWICE DAILY. **DUE FOR OFFICE VISIT, Disp: 180 tablet, Rfl: 0 .  vitamin C (ASCORBIC ACID) 500 MG tablet, Take 1,000 mg by mouth daily., Disp: , Rfl:  .  amoxicillin-clavulanate (AUGMENTIN) 875-125 MG tablet, Take 1 tablet by mouth 2 (two) times daily for 7 days., Disp: 14 tablet, Rfl: 0  Current Facility-Administered Medications:  .  medroxyPROGESTERone (DEPO-PROVERA) injection 150 mg, 150 mg, Intramuscular, Q90 days, Willodean Rosenthal, MD, 150 mg at 10/04/18 8786  Review of Systems:  Constitutional: Denies fever, chills, diaphoresis, appetite change and fatigue.  HEENT: Denies photophobia, eye pain, redness, hearing loss, congestion, sore throat, rhinorrhea, sneezing, mouth sores, trouble swallowing, neck pain, neck stiffness and tinnitus.   Respiratory: Denies  SOB, DOE, cough, chest tightness,  and wheezing.   Cardiovascular: Denies chest pain, palpitations and leg swelling.  Gastrointestinal: Denies nausea, vomiting, abdominal pain, diarrhea, constipation, blood in stool and abdominal distention.  Genitourinary: Denies dysuria, urgency, frequency, hematuria, flank pain and difficulty urinating.  Endocrine: Denies: hot or cold intolerance, sweats, changes in hair or nails, polyuria, polydipsia. Musculoskeletal: Denies myalgias, back pain, joint swelling, arthralgias and gait problem.  Skin: Denies pallor, rash and wound.  Neurological: Denies dizziness, seizures, syncope, weakness, light-headedness, numbness and headaches.  Hematological:  Denies adenopathy. Easy bruising, personal or family bleeding history  Psychiatric/Behavioral: Denies suicidal ideation, mood changes, confusion, nervousness, sleep disturbance and agitation    Physical Exam: Vitals:   12/09/18 1509  BP: 124/70  Pulse: (!) 105  Temp: 98.3 F (36.8 C)  TempSrc: Oral  SpO2: 100%  Weight: 198 lb 11.2 oz (90.1 kg)    Body mass index is 37.54 kg/m.   Constitutional: NAD, calm, comfortable Eyes: PERRL, lids and conjunctivae normal ENMT: Mucous membranes are moist. Posterior pharynx is erythematous but clear of any exudate or lesions. Normal dentition. Tympanic membrane is pearly white, no erythema or bulging on the left, the right tympanic membrane is erythematous with bulging and air-fluid level.  There is some crepitus to movement of the right TMJ joint. Neck: normal, supple, no masses, no thyromegaly, no lymphadenopathy Respiratory: clear to auscultation bilaterally, no wheezing, no crackles. Normal respiratory effort. No accessory muscle use.  Cardiovascular: Regular rate and rhythm, no murmurs / rubs / gallops. No extremity edema. 2+ pedal pulses. No carotid bruits.  Psychiatric: Normal judgment and insight. Alert and oriented x 3. Normal mood.    Impression and Plan:  Non-recurrent acute suppurative otitis media of right ear without spontaneous rupture of tympanic membrane  Jaw pain  -Since she does have an obvious right ear infection, will elect to treat that first with Augmentin for 7 days, this could certainly cause some TMJ pain, however not clear that would cause crepitus of that joint. -She will return in 10 to 14 days after treatment of her ear infection if symptoms persist.    Patient Instructions  -Hope you feel better soon!  -Take augmentin twice daily for 7 days.   Otitis Media, Adult  Otitis media means that the middle ear is red and swollen (inflamed) and full of fluid. The condition usually goes away on its own. Follow  these instructions at home:  Take over-the-counter and prescription medicines only as told by your doctor.  If you were prescribed an antibiotic medicine, take it as told by your doctor. Do not stop taking the antibiotic even if you start to feel better.  Keep all follow-up visits as told by your doctor. This is important. Contact a doctor if:  You have bleeding from your nose.  There is a lump on your neck.  You are not getting better in 5 days.  You feel worse instead of better. Get help right away if:  You have pain that is not helped with medicine.  You have swelling, redness, or pain around your ear.  You get a stiff neck.  You cannot move part of your face (paralyzed).  You notice that the bone behind your ear hurts when you touch it.  You get a very bad headache. Summary  Otitis media means that the middle ear is red, swollen, and full of fluid.  This condition usually goes away on its own. In some cases, treatment may be needed.  If you were prescribed an antibiotic medicine, take it as told by your doctor. This information is not intended to replace advice given to you by your health care provider. Make sure you discuss any questions you have with your health care provider. Document Released: 02/28/2008 Document Revised: 10/02/2016 Document Reviewed: 10/02/2016 Elsevier Interactive Patient Education  2019 Elsevier Inc.      Chaya Jan, MD [facility]

## 2018-12-09 NOTE — Patient Instructions (Signed)
-  Hope you feel better soon!  -Take augmentin twice daily for 7 days.   Otitis Media, Adult  Otitis media means that the middle ear is red and swollen (inflamed) and full of fluid. The condition usually goes away on its own. Follow these instructions at home:  Take over-the-counter and prescription medicines only as told by your doctor.  If you were prescribed an antibiotic medicine, take it as told by your doctor. Do not stop taking the antibiotic even if you start to feel better.  Keep all follow-up visits as told by your doctor. This is important. Contact a doctor if:  You have bleeding from your nose.  There is a lump on your neck.  You are not getting better in 5 days.  You feel worse instead of better. Get help right away if:  You have pain that is not helped with medicine.  You have swelling, redness, or pain around your ear.  You get a stiff neck.  You cannot move part of your face (paralyzed).  You notice that the bone behind your ear hurts when you touch it.  You get a very bad headache. Summary  Otitis media means that the middle ear is red, swollen, and full of fluid.  This condition usually goes away on its own. In some cases, treatment may be needed.  If you were prescribed an antibiotic medicine, take it as told by your doctor. This information is not intended to replace advice given to you by your health care provider. Make sure you discuss any questions you have with your health care provider. Document Released: 02/28/2008 Document Revised: 10/02/2016 Document Reviewed: 10/02/2016 Elsevier Interactive Patient Education  2019 [employer].

## 2018-12-16 ENCOUNTER — Telehealth: Payer: Self-pay | Admitting: Internal Medicine

## 2018-12-16 NOTE — Telephone Encounter (Signed)
She can ask her insurance   About  Flonase( generic ) or nasacort  .  they are otc but can be covered by insurance be .  2 sprays each nostril  Qd  Please send in rx for patient for 1 year

## 2018-12-16 NOTE — Telephone Encounter (Signed)
Copied from CRM [id number]. Topic: Quick Communication - See Telephone Encounter >> Dec 16, 2018  4:34 PM Trula Slade wrote: CRM for notification. See Telephone encounter for: 12/16/18. Patient was in to see the provider last week for an ear infection.  She was prescribed some antibiotics, but the patient states she is still experiencing the ear pain after taking all of the antibiotics.  Please advise.

## 2018-12-17 MED ORDER — FLUTICASONE PROPIONATE 50 MCG/ACT NA SUSP: 2.0000 | Freq: Every day | NASAL | 6 refills | Status: DC

## 2018-12-20 NOTE — Telephone Encounter (Signed)
Not enough infor to answer this  Message  Please contact patient  I was not here last week nor when this message came in   Apologies for the delay  She can set up for web visit if still having problems  Vs   OV to recheck .  Depending on how doing

## 2018-12-23 NOTE — Telephone Encounter (Signed)
Pt states she is still having jaw pain from ear infection the patient has been scheduled for tomorrow at 1:45

## 2018-12-24 ENCOUNTER — Other Ambulatory Visit: Payer: Self-pay

## 2018-12-24 ENCOUNTER — Encounter: Payer: Self-pay | Admitting: Internal Medicine

## 2018-12-24 ENCOUNTER — Ambulatory Visit (INDEPENDENT_AMBULATORY_CARE_PROVIDER_SITE_OTHER): Payer: BLUE CROSS/BLUE SHIELD | Admitting: Internal Medicine

## 2018-12-24 DIAGNOSIS — R29898 Other symptoms and signs involving the musculoskeletal system: Secondary | ICD-10-CM

## 2018-12-24 DIAGNOSIS — R6884 Jaw pain: Secondary | ICD-10-CM | POA: Diagnosis not present

## 2018-12-24 DIAGNOSIS — H9209 Otalgia, unspecified ear: Secondary | ICD-10-CM

## 2018-12-24 NOTE — Progress Notes (Signed)
Virtual Visit via Video Note  I connected with@ on 12/24/18 at  1:45 PM EDT by a video enabled telemedicine application and verified that I am speaking with the correct person using two identifiers. Location patient: home Location provider:work or home office Persons participating in the virtual visit: patient, provider  [place] national recommendations  regarding COVID 19 pandemic   video visit is advised over in office visit for this patient.  Discussed the limitations of evaluation and management by telemedicine and  availability of in person appointments. The patient expressed understanding and agreed to proceed.   HPI: [name]  Seen  By dr Rexene Edison on  3 16 for om and poss tmj pain   rx with auigmentin  But right jaw pain still there  .  Clicks at times but no dislocation.  No hx trauma    Saw dentists in Feb and was all ok  .   Hearing ok.    No uri or fever sx   Used to teeth grit when younger not aware now.   ROS: See pertinent positives and negatives per HPI. No uri sx at this time   Past Medical History:  Diagnosis Date  . Anemia, iron deficiency    With elevated hemoglobin A2  . Chicken pox   . Egg donor     Past Surgical History:  Procedure Laterality Date  . Denies surgical hx    . tuboovarian abcess  04-2015    Family History  Problem Relation Age of Onset  . Hyperlipidemia Mother   . Miscarriages / [place] Mother        Mother has 1 miscarriage  . Cancer Father        Lung Cancer  . Learning disabilities Sister        Sister has Cerebral Palsy and Epilepsy  . Mental retardation Sister   . Cancer Maternal Grandfather        Liver Cancer    SOCIAL HX:    Current Outpatient Medications:  .  fexofenadine (ALLEGRA) 180 MG tablet, Take 180 mg by mouth daily as needed for allergies. , Disp: , Rfl:  .  flunisolide (NASALIDE) 25 MCG/ACT (0.025%) SOLN, USE 2 SPRAYS IN EACH NOSTRIL TWICE DAILY, Disp: 25 mL, Rfl: 11 .  fluticasone (FLONASE) 50 MCG/ACT  nasal spray, Place 2 sprays into both nostrils daily., Disp: 16 g, Rfl: 6 .  IRON PO, Take 3 tablets by mouth at bedtime., Disp: , Rfl:  .  medroxyPROGESTERone (DEPO-PROVERA) 150 MG/ML injection, Inject 1 mL (150 mg total) into the muscle every 3 (three) months., Disp: 1 mL, Rfl: 2 .  Polyethylene Glycol 3350 (MIRALAX PO), Take 1 Dose by mouth daily as needed (constipation)., Disp: , Rfl:  .  Simethicone (GAS-X EXTRA STRENGTH PO), Take 1 tablet by mouth every 4 (four) hours as needed (gas)., Disp: , Rfl:  .  SUMAtriptan (IMITREX) 100 MG tablet, TAKE 1 TABLET BY MOUTH AT ONSET OF MIGRAINE. CAN REPEAT IN 2 HOURS AS NEEDED, Disp: 9 tablet, Rfl: 0 .  topiramate (TOPAMAX) 50 MG tablet, TAKE 1 TABLET(50 MG) BY MOUTH TWICE DAILY. **DUE FOR OFFICE VISIT, Disp: 180 tablet, Rfl: 0 .  vitamin C (ASCORBIC ACID) 500 MG tablet, Take 1,000 mg by mouth daily., Disp: , Rfl:   Current Facility-Administered Medications:  .  medroxyPROGESTERone (DEPO-PROVERA) injection 150 mg, 150 mg, Intramuscular, Q90 days, Willodean Rosenthal, MD, 150 mg at 10/04/18 0934  EXAM:  VITALS per patient if applicable:  GENERAL:  alert, oriented, appears well and in no acute distress  HEENT: atraumatic, conjunttiva clear, no obvious abnormalities on inspection of external nose and ears NECK: normal movements of the head and neck points to right tmj area  Per auricular but rom jaw seems nl  And no obv swelling noted   Nl speech   LUNGS: on inspection no signs of respiratory distress, breathing rate appears normal, no obvious gross SOB, gasping or wheezing  CV: no obvious cyanosis  MS: moves all visible extremities without noticeable abnormality  PSYCH/NEURO: pleasant and cooperative, no obvious depression or anxiety, speech and thought processing grossly intact  ASSESSMENT AND PLAN:  Discussed the following assessment and plan:  Jaw pain  Otalgia, unspecified laterality  TMJ click right   Consistent with tmj problem  dysfunction   antibiotic should have  been adequate rx for om and no new sx of concern with that dx.    Disc  Pathology mechanics of  tmj issues   relative joint rest  ibu 800 every 8 hours or tid for 5+ days and then as need ice at night avoid teeth grit  Let us know how doing in 2 weeks and if not better consider seeing dentist at some point but no urgency at this time  safer to stay isolated  Expectant management and discussion of plan and treatment with patient with opportunity to ask questions and all were answered. The patient agreed with the plan and demonstrated an understanding of the instructions.   The patient was advised to call back or seek an in-person evaluation if worsening having concerns    or if the condition fails to improve as anticipated.   Berniece Andreas, MD

## 2018-12-24 NOTE — Patient Instructions (Signed)
See notes

## 2018-12-26 ENCOUNTER — Ambulatory Visit (INDEPENDENT_AMBULATORY_CARE_PROVIDER_SITE_OTHER): Payer: BLUE CROSS/BLUE SHIELD

## 2018-12-26 ENCOUNTER — Other Ambulatory Visit: Payer: Self-pay

## 2018-12-26 VITALS — Ht 61.0 in | Wt 205.0 lb

## 2018-12-26 DIAGNOSIS — Z3042 Encounter for surveillance of injectable contraceptive: Secondary | ICD-10-CM

## 2018-12-26 NOTE — Progress Notes (Addendum)
Patient presents for Depo Provera. Patient supplied medication. Last annual 10/04/2018. Patient to return between June 18-July 2.  Armandina Stammer RN  Attestation of Attending Supervision of RN: Evaluation and management procedures were performed by the nurse under my supervision and collaboration.  I have reviewed the nursing note and chart, and I agree with the management and plan.  Carolyn L. Harraway-Smith, M.D., Evern Core

## 2019-01-06 ENCOUNTER — Other Ambulatory Visit: Payer: Self-pay | Admitting: Internal Medicine

## 2019-02-26 ENCOUNTER — Other Ambulatory Visit: Payer: Self-pay | Admitting: Internal Medicine

## 2019-03-11 ENCOUNTER — Other Ambulatory Visit: Payer: Self-pay | Admitting: Family Medicine

## 2019-03-13 ENCOUNTER — Ambulatory Visit (INDEPENDENT_AMBULATORY_CARE_PROVIDER_SITE_OTHER): Payer: BC Managed Care – PPO

## 2019-03-13 ENCOUNTER — Other Ambulatory Visit: Payer: Self-pay

## 2019-03-13 VITALS — BP 113/81 | HR 76 | Wt 188.0 lb

## 2019-03-13 DIAGNOSIS — Z3042 Encounter for surveillance of injectable contraceptive: Secondary | ICD-10-CM | POA: Diagnosis not present

## 2019-03-13 NOTE — Progress Notes (Addendum)
Patient tolerated injection well. Patient up to date on annual exam. Patient provided Depo Provera medication. Patient to schedule next dose.  Attestation of Attending Supervision of CMA/RN: Evaluation and management procedures were performed by the nurse under my supervision and collaboration.  I have reviewed the nursing note and chart, and I agree with the management and plan.  Carolyn L. Harraway-Smith, M.D., Cherlynn June

## 2019-03-25 ENCOUNTER — Other Ambulatory Visit: Payer: Self-pay | Admitting: Internal Medicine

## 2019-06-12 ENCOUNTER — Ambulatory Visit (INDEPENDENT_AMBULATORY_CARE_PROVIDER_SITE_OTHER): Payer: BC Managed Care – PPO

## 2019-06-12 ENCOUNTER — Other Ambulatory Visit: Payer: Self-pay

## 2019-06-12 DIAGNOSIS — Z3042 Encounter for surveillance of injectable contraceptive: Secondary | ICD-10-CM | POA: Diagnosis not present

## 2019-06-12 NOTE — Progress Notes (Signed)
Chart reviewed - agree with RN documentation.   

## 2019-06-12 NOTE — Progress Notes (Signed)
Patient presents for Depo Provera injection. Patient to return in three months. Patient not due for annual exam until Jan 2021. Kathrene Alu RN

## 2019-07-02 ENCOUNTER — Other Ambulatory Visit: Payer: Self-pay | Admitting: Internal Medicine

## 2019-09-04 ENCOUNTER — Other Ambulatory Visit: Payer: Self-pay

## 2019-09-04 ENCOUNTER — Ambulatory Visit (INDEPENDENT_AMBULATORY_CARE_PROVIDER_SITE_OTHER): Payer: BC Managed Care – PPO

## 2019-09-04 DIAGNOSIS — Z23 Encounter for immunization: Secondary | ICD-10-CM | POA: Diagnosis not present

## 2019-09-04 DIAGNOSIS — Z3042 Encounter for surveillance of injectable contraceptive: Secondary | ICD-10-CM

## 2019-09-04 NOTE — Progress Notes (Signed)
Patient presents for scheduled Depo Provera. Patient also requests a flu shot. Up to date on her annual exam. Kathrene Alu RN

## 2019-11-15 ENCOUNTER — Other Ambulatory Visit: Payer: Self-pay | Admitting: Internal Medicine

## 2019-11-28 ENCOUNTER — Other Ambulatory Visit: Payer: Self-pay | Admitting: Family Medicine

## 2019-12-03 ENCOUNTER — Other Ambulatory Visit: Payer: Self-pay

## 2019-12-03 MED ORDER — MEDROXYPROGESTERONE ACETATE 150 MG/ML IM SUSP: 150.0000 mg | Freq: Once | INTRAMUSCULAR | 3 refills | Status: DC

## 2019-12-03 NOTE — Telephone Encounter (Signed)
Patient called needing refill on Depo Provera for next injection tomorrow. Armandina Stammer RN

## 2019-12-04 ENCOUNTER — Other Ambulatory Visit: Payer: Self-pay

## 2019-12-04 ENCOUNTER — Ambulatory Visit (INDEPENDENT_AMBULATORY_CARE_PROVIDER_SITE_OTHER): Payer: BC Managed Care – PPO

## 2019-12-04 VITALS — Ht 61.0 in | Wt 205.0 lb

## 2019-12-04 DIAGNOSIS — Z3042 Encounter for surveillance of injectable contraceptive: Secondary | ICD-10-CM

## 2019-12-04 NOTE — Progress Notes (Addendum)
Patient presents for Depo provera today. Patient scheduled next appointment for injection and annual exam. Armandina Stammer RN   Attestation of Attending Supervision of RN: Evaluation and management procedures were performed by the nurse under my supervision and collaboration.  I have reviewed the nursing note and chart, and I agree with the management and plan.  Carolyn L. Harraway-Smith, M.D., Evern Core

## 2020-01-12 ENCOUNTER — Other Ambulatory Visit: Payer: Self-pay | Admitting: Internal Medicine

## 2020-01-13 ENCOUNTER — Telehealth (INDEPENDENT_AMBULATORY_CARE_PROVIDER_SITE_OTHER): Payer: BC Managed Care – PPO | Admitting: Internal Medicine

## 2020-01-13 ENCOUNTER — Telehealth: Payer: Self-pay | Admitting: Internal Medicine

## 2020-01-13 ENCOUNTER — Encounter: Payer: Self-pay | Admitting: Internal Medicine

## 2020-01-13 ENCOUNTER — Other Ambulatory Visit: Payer: Self-pay

## 2020-01-13 VITALS — HR 76 | Temp 97.6°F | Ht 61.0 in | Wt 190.0 lb

## 2020-01-13 DIAGNOSIS — Z79899 Other long term (current) drug therapy: Secondary | ICD-10-CM

## 2020-01-13 DIAGNOSIS — G43809 Other migraine, not intractable, without status migrainosus: Secondary | ICD-10-CM

## 2020-01-13 MED ORDER — TOPIRAMATE 50 MG PO TABS: ORAL_TABLET | ORAL | 3 refills | Status: DC

## 2020-01-13 MED ORDER — NURTEC 75 MG PO TBDP: 1.0000 | ORAL_TABLET | ORAL | 1 refills | Status: DC

## 2020-01-13 NOTE — Progress Notes (Signed)
Virtual Visit via Video Note  I connected with@ on 01/13/20 at  3:00 PM EDT by a video enabled telemedicine application and verified that I am speaking with the correct person using two identifiers. Location patient: home Location provider:work  office Persons participating in the virtual visit: patient, provider  [place] national recommendations  regarding COVID 19 pandemic   video visit is advised over in office visit for this patient.  Patient aware  of the limitations of evaluation and management by telemedicine and  availability of in person appointments. and agreed to proceed.   HPI: [name] presents for video visit for med eval  Topamax  2 50 mg in evening  Last visit was  Over a year ago   Mor severe Ha in past 3- months  Having  2-3 per month but last 4 days often in middle of night and takes Imitrex  X 2 and gets partial relief but the ha comes back lasts long time. photophobia  Etc  cant define new triggers    Since Feb increase in   Has . In past occurred about every 3-4 months  And controlled  Is on depoproveral no new  Medical conditions   No fever injury vision changes  Fever Got the first covid vaccine  Neg tad trigger  Allergies stable?  ROS: See pertinent positives and negatives per HPI. She sees gyne. Past Medical History:  Diagnosis Date  . Anemia, iron deficiency    With elevated hemoglobin A2  . Chicken pox   . Egg donor     Past Surgical History:  Procedure Laterality Date  . Denies surgical hx    . tuboovarian abcess  04-2015    Family History  Problem Relation Age of Onset  . Hyperlipidemia Mother   . Miscarriages / [place] Mother        Mother has 1 miscarriage  . Cancer Father        Lung Cancer  . Learning disabilities Sister        Sister has Cerebral Palsy and Epilepsy  . Mental retardation Sister   . Cancer Maternal Grandfather        Liver Cancer    Social History   Tobacco Use  . Smoking status: Never Smoker  .  Smokeless tobacco: Never Used  Substance Use Topics  . Alcohol use: No    Alcohol/week: 0.0 standard drinks  . Drug use: No      Current Outpatient Medications:  .  fexofenadine (ALLEGRA) 180 MG tablet, Take 180 mg by mouth daily as needed for allergies. , Disp: , Rfl:  .  fluticasone (FLONASE) 50 MCG/ACT nasal spray, Place 2 sprays into both nostrils daily., Disp: 16 g, Rfl: 6 .  IRON PO, Take 3 tablets by mouth at bedtime., Disp: , Rfl:  .  medroxyPROGESTERone (DEPO-PROVERA) 150 MG/ML injection, ADMINISTER 1 ML(150 MG) IN THE MUSCLE EVERY 3 MONTHS, Disp: 1 mL, Rfl: 2 .  Simethicone (GAS-X EXTRA STRENGTH PO), Take 1 tablet by mouth every 4 (four) hours as needed (gas)., Disp: , Rfl:  .  SUMAtriptan (IMITREX) 100 MG tablet, TAKE 1 TABLET BY MOUTH AT ONSET OF MIGRAINE. MAY REPEAT IN 2 HOURS AS NEEDED, Disp: 9 tablet, Rfl: 0 .  topiramate (TOPAMAX) 50 MG tablet, TAKE 1 TABLET(50 MG) BY MOUTH  Morning  and 2 tablets at night or as directed, Disp: 90 tablet, Rfl: 3 .  vitamin C (ASCORBIC ACID) 500 MG tablet, Take 1,000 mg by mouth daily., Disp: ,  Rfl:  .  flunisolide (NASALIDE) 25 MCG/ACT (0.025%) SOLN, USE 2 SPRAYS IN EACH NOSTRIL TWICE DAILY (Patient not taking: Reported on 01/13/2020), Disp: 25 mL, Rfl: 11 .  medroxyPROGESTERone (DEPO-PROVERA) 150 MG/ML injection, Inject 1 mL (150 mg total) into the muscle once for 1 dose., Disp: 1 mL, Rfl: 3 .  Rimegepant Sulfate (NURTEC) 75 MG TBDP, Take 1 tablet by mouth as directed. Dissolvable As needed for acute migraine ., Disp: 15 tablet, Rfl: 1  Current Facility-Administered Medications:  .  medroxyPROGESTERone (DEPO-PROVERA) injection 150 mg, 150 mg, Intramuscular, Q90 days, Willodean Rosenthal, MD, 150 mg at 12/04/19 0846  EXAM: BP Readings from Last 3 Encounters:  03/13/19 113/81  12/09/18 124/70  10/04/18 132/88    VITALS per patient if applicable: 190  GENERAL: alert, oriented, appears well and in no acute distress  HEENT:  atraumatic, conjunttiva clear, no obvious abnormalities on inspection of external nose and ears  NECK: normal movements of the head and neck  LUNGS: on inspection no signs of respiratory distress, breathing rate appears normal, no obvious gross SOB, gasping or wheezing  CV: no obvious cyanosis  MS: moves all visible extremities without noticeable abnormality  PSYCH/NEURO: pleasant and cooperative, no obvious depression or anxiety, speech and thought processing grossly intact Lab Results  Component Value Date   WBC 5.4 08/08/2017   HGB 12.2 08/08/2017   HCT 38.1 08/08/2017   PLT 239.0 08/08/2017   GLUCOSE 92 08/08/2017   CHOL 183 08/08/2017   TRIG 142.0 08/08/2017   HDL 34.80 (L) 08/08/2017   LDLCALC 120 (H) 08/08/2017   ALT 14 08/08/2017   AST 14 08/08/2017   NA 138 08/08/2017   K 4.2 08/08/2017   CL 104 08/08/2017   CREATININE 0.75 08/08/2017   BUN 10 08/08/2017   CO2 26 08/08/2017   TSH 1.72 08/08/2017   INR 1.17 05/17/2015   HGBA1C 6.1 08/08/2017    ASSESSMENT AND PLAN:  Discussed the following assessment and plan:    ICD-10-CM   1. Other migraine without status migrainosus, not intractable  G43.809   2. Medication management  Z79.899    More severe no obv trigger   Failing sumatriptan at this time   Check for triggers can try inc Topamax to 150 per day  If tolerates  rx for  Nurtec odt  For failed  triptan  And then  Fu  Counseled.   Expectant management and discussion of plan and treatment with opportunity to ask questions and all were answered. The patient agreed with the plan and demonstrated an understanding of the instructions.  plan in person visit in 2-3 months  Or as needed  Advised to call back or seek an in-person evaluation if worsening  or having  further concerns . In interim  Return for 2-3 months in person regarding HA and meds .  Berniece Andreas, MD  Outside external source  DATA REVIEWED:   Total time on date  of service including record review  ordering and plan of care:  30 minutes

## 2020-01-13 NOTE — Telephone Encounter (Signed)
Pt is calling in stating that her Rx topiramate (TOPAMAX) 50 MG was denied and she is out.  Pharm:  [facility] in [place] on [address]

## 2020-01-13 NOTE — Telephone Encounter (Signed)
Called patient and set up a virtual today for a med refill since it has been over a year since her last visit. Patient verbalized an understanding.

## 2020-01-20 ENCOUNTER — Telehealth: Payer: Self-pay

## 2020-01-20 NOTE — Telephone Encounter (Signed)
Started prior authorization for Nurtec 75 mg dispersible tablet Key: [id number] Request approved Case ID: [id number] Start Date: 12/21/2019 End Date: 01/19/2021

## 2020-02-19 ENCOUNTER — Other Ambulatory Visit (HOSPITAL_COMMUNITY)
Admission: RE | Admit: 2020-02-19 | Discharge: 2020-02-19 | Disposition: A | Discharge status: Home / Self Care | Payer: BC Managed Care – PPO | Source: Ambulatory Visit | Attending: Obstetrics & Gynecology | Admitting: Obstetrics & Gynecology

## 2020-02-19 ENCOUNTER — Ambulatory Visit (INDEPENDENT_AMBULATORY_CARE_PROVIDER_SITE_OTHER): Payer: BC Managed Care – PPO | Admitting: Obstetrics & Gynecology

## 2020-02-19 ENCOUNTER — Other Ambulatory Visit: Payer: Self-pay

## 2020-02-19 ENCOUNTER — Encounter: Payer: Self-pay | Admitting: Obstetrics & Gynecology

## 2020-02-19 VITALS — BP 117/79 | HR 75 | Ht 61.0 in | Wt 189.0 lb

## 2020-02-19 DIAGNOSIS — Z01419 Encounter for gynecological examination (general) (routine) without abnormal findings: Secondary | ICD-10-CM | POA: Insufficient documentation

## 2020-02-19 DIAGNOSIS — Z113 Encounter for screening for infections with a predominantly sexual mode of transmission: Secondary | ICD-10-CM

## 2020-02-19 DIAGNOSIS — Z3042 Encounter for surveillance of injectable contraceptive: Secondary | ICD-10-CM

## 2020-02-19 DIAGNOSIS — Z3009 Encounter for other general counseling and advice on contraception: Secondary | ICD-10-CM

## 2020-02-19 NOTE — Progress Notes (Signed)
Subjective:     [name] is a 34 y.o. female here for a routine exam.  Current complaints: none. Pt received her Depo Provera injection today. No issues. Pt has not been able to travel this year due to Covid. Plans to go 'somewhere in Nov'.       Gynecologic History No LMP recorded. Patient has had an injection. Contraception: Depo-Provera injections Last Pap: 10/04/2018. Results were: normal Last mammogram: n/a.  Obstetric History OB History  Gravida Para Term Preterm AB Living  0 0 0 0 0 0  SAB TAB Ectopic Multiple Live Births  0 0 0 0     The following portions of the patient's history were reviewed and updated as appropriate: allergies, current medications, past family history, past medical history, past social history, past surgical history and problem list.  Review of Systems Pertinent items are noted in HPI.    Objective:  BP 117/79   Pulse 75   Ht '5\' 1"'  (1.549 m)   Wt 189 lb 0.6 oz (85.7 kg)   BMI 35.72 kg/m   General Appearance:    Alert, cooperative, no distress, appears stated age  Head:    Normocephalic, without obvious abnormality, atraumatic  Eyes:    conjunctiva/corneas clear, EOM's intact, both eyes  Ears:    Normal external ear canals, both ears  Nose:   Nares normal, septum midline, mucosa normal, no drainage    or sinus tenderness  Throat:   Lips, mucosa, and tongue normal; teeth and gums normal  Neck:   Supple, symmetrical, trachea midline, no adenopathy;    thyroid:  no enlargement/tenderness/nodules  Back:     Symmetric, no curvature, ROM normal, no CVA tenderness  Lungs:     respirations unlabored  Chest Wall:    No tenderness or deformity   Heart:    Regular rate and rhythm  Breast Exam:    No tenderness, masses, or nipple abnormality  Abdomen:     Soft, non-tender, bowel sounds active all four quadrants,    no masses, no organomegaly  Genitalia:    Normal female without lesion, discharge or tenderness     Extremities:   Extremities normal,  atraumatic, no cyanosis or edema  Pulses:   2+ and symmetric all extremities  Skin:   Skin color, texture, turgor normal, no rashes or lesions    Assessment:    Healthy female exam.   STI screen  Depo Provera injection   Plan:     Diagnoses and all orders for this visit:  Well female exam with routine gynecological exam -     Cervicovaginal ancillary only( [place]) -     Comp Met (CMET) -     Hemoglobin A1c -     CBC -     TSH -     HIV antibody (with reflex) -     RPR -     Hepatitis B surface antigen -     Hepatitis C antibody  Routine screening for STI (sexually transmitted infection) -     HIV antibody (with reflex) -     RPR -     Hepatitis B surface antigen -     Hepatitis C antibody  Encounter for counseling regarding contraception  f/u in 1 year or sooner prn  Marva Hendryx L. Harraway-Smith, M.D., Cherlynn June

## 2020-02-20 LAB — CERVICOVAGINAL ANCILLARY ONLY
Chlamydia: NEGATIVE
Comment: NEGATIVE
Comment: NORMAL
Neisseria Gonorrhea: NEGATIVE

## 2020-02-25 DIAGNOSIS — Z01419 Encounter for gynecological examination (general) (routine) without abnormal findings: Secondary | ICD-10-CM | POA: Diagnosis not present

## 2020-02-25 DIAGNOSIS — Z113 Encounter for screening for infections with a predominantly sexual mode of transmission: Secondary | ICD-10-CM | POA: Diagnosis not present

## 2020-02-26 LAB — HEMOGLOBIN A1C
Est. average glucose Bld gHb Est-mCnc: 111 mg/dL
Hgb A1c MFr Bld: 5.5 % (ref 4.8–5.6)

## 2020-02-26 LAB — TSH: TSH: 2.22 u[IU]/mL (ref 0.450–4.500)

## 2020-02-26 LAB — CBC
Hematocrit: 40.2 % (ref 34.0–46.6)
Hemoglobin: 12.7 g/dL (ref 11.1–15.9)
MCH: 25.1 pg — ABNORMAL LOW (ref 26.6–33.0)
MCHC: 31.6 g/dL (ref 31.5–35.7)
MCV: 79 fL (ref 79–97)
Platelets: 258 10*3/uL (ref 150–450)
RBC: 5.06 x10E6/uL (ref 3.77–5.28)
RDW: 15.1 % (ref 11.7–15.4)
WBC: 5.7 10*3/uL (ref 3.4–10.8)

## 2020-02-26 LAB — COMPREHENSIVE METABOLIC PANEL
ALT: 9 IU/L (ref 0–32)
AST: 11 IU/L (ref 0–40)
Albumin/Globulin Ratio: 1.4 (ref 1.2–2.2)
Albumin: 4.7 g/dL (ref 3.8–4.8)
Alkaline Phosphatase: 66 IU/L (ref 48–121)
BUN/Creatinine Ratio: 9 (ref 9–23)
BUN: 9 mg/dL (ref 6–20)
Bilirubin Total: 0.3 mg/dL (ref 0.0–1.2)
CO2: 17 mmol/L — ABNORMAL LOW (ref 20–29)
Calcium: 9.9 mg/dL (ref 8.7–10.2)
Chloride: 108 mmol/L — ABNORMAL HIGH (ref 96–106)
Creatinine, Ser: 0.98 mg/dL (ref 0.57–1.00)
GFR calc Af Amer: 86 mL/min/{1.73_m2} (ref >59)
GFR calc non Af Amer: 75 mL/min/{1.73_m2} (ref >59)
Globulin, Total: 3.3 g/dL (ref 1.5–4.5)
Glucose: 97 mg/dL (ref 65–99)
Potassium: 4.2 mmol/L (ref 3.5–5.2)
Sodium: 139 mmol/L (ref 134–144)
Total Protein: 8 g/dL (ref 6.0–8.5)

## 2020-02-26 LAB — HEPATITIS C ANTIBODY: Hep C Virus Ab: <0.1 s/co ratio (ref 0.0–0.9)

## 2020-02-26 LAB — HIV ANTIBODY (ROUTINE TESTING W REFLEX): HIV Screen 4th Generation wRfx: NONREACTIVE

## 2020-02-26 LAB — HEPATITIS B SURFACE ANTIGEN: Hepatitis B Surface Ag: NEGATIVE

## 2020-02-26 LAB — RPR: RPR Ser Ql: NONREACTIVE

## 2020-03-12 ENCOUNTER — Other Ambulatory Visit: Payer: Self-pay

## 2020-03-12 NOTE — Progress Notes (Deleted)
No chief complaint on file.   HPI: [name] 34 y.o. come in for Chronic disease management  ROS: See pertinent positives and negatives per HPI.  Past Medical History:  Diagnosis Date  . Anemia, iron deficiency    With elevated hemoglobin A2  . Chicken pox   . Egg donor     Family History  Problem Relation Age of Onset  . Hyperlipidemia Mother   . Miscarriages / [place] Mother        Mother has 1 miscarriage  . Cancer Father        Lung Cancer  . Learning disabilities Sister        Sister has Cerebral Palsy and Epilepsy  . Mental retardation Sister   . Cancer Maternal Grandfather        Liver Cancer    Social History   Socioeconomic History  . Marital status: Single    Spouse name: Not on file  . Number of children: Not on file  . Years of education: Not on file  . Highest education level: Not on file  Occupational History  . Not on file  Tobacco Use  . Smoking status: Never Smoker  . Smokeless tobacco: Never Used  Vaping Use  . Vaping Use: Never used  Substance and Sexual Activity  . Alcohol use: No    Alcohol/week: 0.0 standard drinks  . Drug use: No  . Sexual activity: Yes    Birth control/protection: Injection  Other Topics Concern  . Not on file  Social History Narrative   hhof 2 and 2  plus dog   Working  Dispensing optician.   School [employer]  Senior  [employer] .  6 hours   40 work  And 12 credits .Marland Kitchen Done dec 16    No ets. Tobacco   caffiene  Once coke zero.   2-3    ETOH:  Wine weekends.    WALKING .     Working on knee  Problems  Has seen SM .  wendover  ocass aleve.       Social Determinants of Health   Financial Resource Strain:   . Difficulty of Paying Living Expenses:   Food Insecurity:   . Worried About Charity fundraiser in the Last Year:   . Arboriculturist in the Last Year:   Transportation Needs:   . Film/video editor (Medical):   Marland Kitchen Lack of Transportation (Non-Medical):   Physical Activity:   . Days of Exercise per Week:     . Minutes of Exercise per Session:   Stress:   . Feeling of Stress :   Social Connections:   . Frequency of Communication with Friends and Family:   . Frequency of Social Gatherings with Friends and Family:   . Attends Religious Services:   . Active Member of Clubs or Organizations:   . Attends Archivist Meetings:   Marland Kitchen Marital Status:     Outpatient Medications Prior to Visit  Medication Sig Dispense Refill  . fexofenadine (ALLEGRA) 180 MG tablet Take 180 mg by mouth daily as needed for allergies.     . flunisolide (NASALIDE) 25 MCG/ACT (0.025%) SOLN USE 2 SPRAYS IN EACH NOSTRIL TWICE DAILY (Patient not taking: Reported on 01/13/2020) 25 mL 11  . fluticasone (FLONASE) 50 MCG/ACT nasal spray Place 2 sprays into both nostrils daily. 16 g 6  . IRON PO Take 3 tablets by mouth at bedtime.    Marland Kitchen  medroxyPROGESTERone (DEPO-PROVERA) 150 MG/ML injection ADMINISTER 1 ML(150 MG) IN THE MUSCLE EVERY 3 MONTHS 1 mL 2  . medroxyPROGESTERone (DEPO-PROVERA) 150 MG/ML injection Inject 1 mL (150 mg total) into the muscle once for 1 dose. 1 mL 3  . Rimegepant Sulfate (NURTEC) 75 MG TBDP Take 1 tablet by mouth as directed. Dissolvable As needed for acute migraine . 15 tablet 1  . Simethicone (GAS-X EXTRA STRENGTH PO) Take 1 tablet by mouth every 4 (four) hours as needed (gas).    . SUMAtriptan (IMITREX) 100 MG tablet TAKE 1 TABLET BY MOUTH AT ONSET OF MIGRAINE. MAY REPEAT IN 2 HOURS AS NEEDED (Patient not taking: Reported on 02/19/2020) 9 tablet 0  . topiramate (TOPAMAX) 50 MG tablet TAKE 1 TABLET(50 MG) BY MOUTH  Morning  and 2 tablets at night or as directed 90 tablet 3  . vitamin C (ASCORBIC ACID) 500 MG tablet Take 1,000 mg by mouth daily.     Facility-Administered Medications Prior to Visit  Medication Dose Route Frequency Provider Last Rate Last Admin  . medroxyPROGESTERone (DEPO-PROVERA) injection 150 mg  150 mg Intramuscular Q90 days Willodean Rosenthal, MD   150 mg at 02/19/20 0900      EXAM:  There were no vitals taken for this visit.  There is no height or weight on file to calculate BMI.  GENERAL: vitals reviewed and listed above, alert, oriented, appears well hydrated and in no acute distress HEENT: atraumatic, conjunctiva  clear, no obvious abnormalities on inspection of external nose and ears OP : no lesion edema or exudate  NECK: no obvious masses on inspection palpation  LUNGS: clear to auscultation bilaterally, no wheezes, rales or rhonchi, good air movement CV: HRRR, no clubbing cyanosis or  peripheral edema nl cap refill  MS: moves all extremities without noticeable focal  abnormality PSYCH: pleasant and cooperative, no obvious depression or anxiety Lab Results  Component Value Date   WBC 5.7 02/25/2020   HGB 12.7 02/25/2020   HCT 40.2 02/25/2020   PLT 258 02/25/2020   GLUCOSE 97 02/25/2020   CHOL 183 08/08/2017   TRIG 142.0 08/08/2017   HDL 34.80 (L) 08/08/2017   LDLCALC 120 (H) 08/08/2017   ALT 9 02/25/2020   AST 11 02/25/2020   NA 139 02/25/2020   K 4.2 02/25/2020   CL 108 (H) 02/25/2020   CREATININE 0.98 02/25/2020   BUN 9 02/25/2020   CO2 17 (L) 02/25/2020   TSH 2.220 02/25/2020   INR 1.17 05/17/2015   HGBA1C 5.5 02/25/2020   BP Readings from Last 3 Encounters:  02/19/20 117/79  03/13/19 113/81  12/09/18 124/70    ASSESSMENT AND PLAN:  Discussed the following assessment and plan:  No diagnosis found.  -Patient advised to return or notify health care team  if  new concerns arise.  There are no Patient Instructions on file for this visit.   Neta Mends. Walterine Amodei M.D.

## 2020-03-15 ENCOUNTER — Ambulatory Visit: Payer: BC Managed Care – PPO | Admitting: Internal Medicine

## 2020-03-22 ENCOUNTER — Ambulatory Visit: Payer: BC Managed Care – PPO | Admitting: Internal Medicine

## 2020-04-19 NOTE — Progress Notes (Signed)
Chief Complaint  Patient presents with   Follow-up    Doing okay   Ear Pain    Left ear    HPI: [name] 34 y.o. come in for headaches   Medications  Now taking topomax 150 per day  And  nurtec as needed  Doing a lot better   Check left ear   Hx of  Infection when laying on side .  No  rx inside .  With nurtec and  Helps migraine.  In about an hour and subsides  Has had 3 migraines total waking with them .  Sleep is ok  r nasal stuffiness the day before?    Allegra and flonase every day .   Has had her  Gyne check Has had her covid vaccine ROS: See pertinent positives and negatives per HPI.  Past Medical History:  Diagnosis Date   Anemia, iron deficiency    With elevated hemoglobin A2   Chicken pox    Egg donor     Family History  Problem Relation Age of Onset   Hyperlipidemia Mother    Miscarriages / [place] Mother        Mother has 1 miscarriage   Cancer Father        Lung Cancer   Learning disabilities Sister        Sister has Cerebral Palsy and Epilepsy   Mental retardation Sister    Cancer Maternal Grandfather        Liver Cancer    Social History   Socioeconomic History   Marital status: Single    Spouse name: Not on file   Number of children: Not on file   Years of education: Not on file   Highest education level: Not on file  Occupational History   Not on file  Tobacco Use   Smoking status: Never Smoker   Smokeless tobacco: Never Used  Vaping Use   Vaping Use: Never used  Substance and Sexual Activity   Alcohol use: No    Alcohol/week: 0.0 standard drinks   Drug use: No   Sexual activity: Yes    Birth control/protection: Injection  Other Topics Concern   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  6 hours   40 work  And 12 credits .Marland Kitchen Done dec 16    No ets. Tobacco   caffiene  Once coke zero.   2-3    ETOH:  Wine weekends.     WALKING .     Working on knee  Problems  Has seen SM .  wendover  ocass aleve.       Social Determinants of Health   Financial Resource Strain:    Difficulty of Paying Living Expenses:   Food Insecurity:    Worried About Programme researcher, broadcasting/film/video in the Last Year:    Barista in the Last Year:   Transportation Needs:    Freight forwarder (Medical):    Lack of Transportation (Non-Medical):   Physical Activity:    Days of Exercise per Week:    Minutes of Exercise per Session:   Stress:    Feeling of Stress :   Social Connections:    Frequency of Communication with Friends and Family:    Frequency of Social Gatherings with Friends and Family:    Attends Religious  Services:    Active Member of Clubs or Organizations:    Attends Banker Meetings:    Marital Status:     Outpatient Medications Prior to Visit  Medication Sig Dispense Refill   fexofenadine (ALLEGRA) 180 MG tablet Take 180 mg by mouth daily as needed for allergies.      fluticasone (FLONASE) 50 MCG/ACT nasal spray Place 2 sprays into both nostrils daily. 16 g 6   IRON PO Take 3 tablets by mouth at bedtime.     medroxyPROGESTERone (DEPO-PROVERA) 150 MG/ML injection ADMINISTER 1 ML(150 MG) IN THE MUSCLE EVERY 3 MONTHS 1 mL 2   Rimegepant Sulfate (NURTEC) 75 MG TBDP Take 1 tablet by mouth as directed. Dissolvable As needed for acute migraine . 15 tablet 1   Simethicone (GAS-X EXTRA STRENGTH PO) Take 1 tablet by mouth every 4 (four) hours as needed (gas).     topiramate (TOPAMAX) 50 MG tablet TAKE 1 TABLET(50 MG) BY MOUTH  Morning  and 2 tablets at night or as directed 90 tablet 3   vitamin C (ASCORBIC ACID) 500 MG tablet Take 1,000 mg by mouth daily.     flunisolide (NASALIDE) 25 MCG/ACT (0.025%) SOLN USE 2 SPRAYS IN EACH NOSTRIL TWICE DAILY (Patient not taking: Reported on 04/20/2020) 25 mL 11   medroxyPROGESTERone (DEPO-PROVERA) 150 MG/ML injection Inject 1 mL (150 mg total) into  the muscle once for 1 dose. 1 mL 3   SUMAtriptan (IMITREX) 100 MG tablet TAKE 1 TABLET BY MOUTH AT ONSET OF MIGRAINE. MAY REPEAT IN 2 HOURS AS NEEDED (Patient not taking: Reported on 02/19/2020) 9 tablet 0   Facility-Administered Medications Prior to Visit  Medication Dose Route Frequency Provider Last Rate Last Admin   medroxyPROGESTERone (DEPO-PROVERA) injection 150 mg  150 mg Intramuscular Q90 days Willodean Rosenthal, MD   150 mg at 02/19/20 0900     EXAM:  BP 116/74    Pulse 71    Temp 98.8 F (37.1 C) (Oral)    Ht 5\' 1"  (1.549 m)    Wt 188 lb (85.3 kg)    SpO2 97%    BMI 35.52 kg/m   Body mass index is 35.52 kg/m.  GENERAL: vitals reviewed and listed above, alert, oriented, appears well hydrated and in no acute distress HEENT: atraumatic, conjunctiva  clear, no obvious abnormalities on inspection of external nose and ears  Tm clear eac pierced   Canal mildly  Red but no edema or  Discharge OP : masked    NECK: no obvious masses on inspection palpation  LUNGS: clear to auscultation bilaterally, no wheezes, rales or rhonchi, good air movement CV: HRRR, no clubbing cyanosis or  peripheral edema nl cap refill  MS: moves all extremities without noticeable focal  Abnormality Neuro grossly non focal and normal  PSYCH: pleasant and cooperative, no obvious depression or anxiety Lab Results  Component Value Date   WBC 5.7 02/25/2020   HGB 12.7 02/25/2020   HCT 40.2 02/25/2020   PLT 258 02/25/2020   GLUCOSE 97 02/25/2020   CHOL 183 08/08/2017   TRIG 142.0 08/08/2017   HDL 34.80 (L) 08/08/2017   LDLCALC 120 (H) 08/08/2017   ALT 9 02/25/2020   AST 11 02/25/2020   NA 139 02/25/2020   K 4.2 02/25/2020   CL 108 (H) 02/25/2020   CREATININE 0.98 02/25/2020   BUN 9 02/25/2020   CO2 17 (L) 02/25/2020   TSH 2.220 02/25/2020   INR 1.17 05/17/2015   HGBA1C 5.5 02/25/2020  BP Readings from Last 3 Encounters:  04/20/20 116/74  02/19/20 117/79  03/13/19 113/81    ASSESSMENT  AND PLAN:  Discussed the following assessment and plan:  Other migraine without status migrainosus, not intractable  Medication management  Otalgia, unspecified laterality Migraines  More controlled     Continue on controller and same med    Still track and avoid known triggers . Plan rov in about 6 mos  Med check   Ear seems like mild external   ear irritation but no oe     Expectant management. And fu if needed   -Patient advised to return or notify health care team  if  new concerns arise.  Patient Instructions  Continue to track headaches and llot for triggers the day before .  Stay on same medication Left ear is not infected but ear canal slightly irritated  Avoid water in ear  Can use otc swimmers ear drops if needed  Getting worse.    Neta Mends. Ravina Milner M.D.

## 2020-04-20 ENCOUNTER — Other Ambulatory Visit: Payer: Self-pay

## 2020-04-20 ENCOUNTER — Ambulatory Visit: Payer: BC Managed Care – PPO | Admitting: Internal Medicine

## 2020-04-20 ENCOUNTER — Encounter: Payer: Self-pay | Admitting: Internal Medicine

## 2020-04-20 VITALS — BP 116/74 | HR 71 | Temp 98.8°F | Ht 61.0 in | Wt 188.0 lb

## 2020-04-20 DIAGNOSIS — H9209 Otalgia, unspecified ear: Secondary | ICD-10-CM

## 2020-04-20 DIAGNOSIS — Z79899 Other long term (current) drug therapy: Secondary | ICD-10-CM | POA: Diagnosis not present

## 2020-04-20 DIAGNOSIS — G43809 Other migraine, not intractable, without status migrainosus: Secondary | ICD-10-CM | POA: Diagnosis not present

## 2020-04-20 NOTE — Patient Instructions (Addendum)
Continue to track headaches and llot for triggers the day before .  Stay on same medication Left ear is not infected but ear canal slightly irritated  Avoid water in ear  Can use otc swimmers ear drops if needed  Getting worse.

## 2020-05-06 ENCOUNTER — Ambulatory Visit: Payer: BC Managed Care – PPO

## 2020-05-17 ENCOUNTER — Other Ambulatory Visit: Payer: Self-pay

## 2020-05-17 ENCOUNTER — Ambulatory Visit (INDEPENDENT_AMBULATORY_CARE_PROVIDER_SITE_OTHER): Payer: BC Managed Care – PPO

## 2020-05-17 DIAGNOSIS — Z3042 Encounter for surveillance of injectable contraceptive: Secondary | ICD-10-CM

## 2020-05-17 DIAGNOSIS — Z3009 Encounter for other general counseling and advice on contraception: Secondary | ICD-10-CM

## 2020-05-17 NOTE — Progress Notes (Signed)
Chart reviewed - agree with CMA/RN documentation.  ° °

## 2020-05-17 NOTE — Progress Notes (Signed)
Patient presents for Depo provera injection.  Patient provided medication. Patient will return in three months for next injection.  Armandina Stammer RN

## 2020-06-28 ENCOUNTER — Other Ambulatory Visit: Payer: Self-pay

## 2020-06-28 ENCOUNTER — Ambulatory Visit (INDEPENDENT_AMBULATORY_CARE_PROVIDER_SITE_OTHER): Payer: BC Managed Care – PPO

## 2020-06-28 DIAGNOSIS — Z23 Encounter for immunization: Secondary | ICD-10-CM | POA: Diagnosis not present

## 2020-06-28 NOTE — Progress Notes (Signed)
Patient presented for flu vaccine. Armandina Stammer RN

## 2020-07-05 ENCOUNTER — Other Ambulatory Visit: Payer: Self-pay | Admitting: Internal Medicine

## 2020-07-22 ENCOUNTER — Ambulatory Visit: Payer: Self-pay

## 2020-07-22 ENCOUNTER — Encounter: Payer: Self-pay | Admitting: Family Medicine

## 2020-07-22 ENCOUNTER — Ambulatory Visit: Payer: BC Managed Care – PPO | Admitting: Family Medicine

## 2020-07-22 ENCOUNTER — Other Ambulatory Visit: Payer: Self-pay

## 2020-07-22 VITALS — BP 118/82 | HR 93 | Ht 61.0 in | Wt 180.0 lb

## 2020-07-22 DIAGNOSIS — M778 Other enthesopathies, not elsewhere classified: Secondary | ICD-10-CM | POA: Diagnosis not present

## 2020-07-22 DIAGNOSIS — G8929 Other chronic pain: Secondary | ICD-10-CM

## 2020-07-22 NOTE — Assessment & Plan Note (Signed)
Acute on chronic in nature.  Does have symptoms suggestive of more of a capsulitis with findings on ultrasound to suggest a subacromial bursitis as well.  This may be secondary to underlying capsular type pain. -Counseled on home exercise therapy and supportive care. -Duexis samples. -Could consider imaging or physical therapy.

## 2020-07-22 NOTE — Progress Notes (Signed)
Mazzie Brodrick - 34 y.o. female MRN [id number]  Date of birth: 1986-03-27  SUBJECTIVE:  Including CC & ROS.  Chief Complaint  Patient presents with  . Shoulder Pain    left x 2 months    Cela Newcom is a 34 y.o. female that is presenting with acute on chronic left shoulder pain.  The pain has been ongoing for 2 months.  Denies any specific inciting event.  She feels that and external rotation as well as flexion.  Has not reached much improvement with modalities to date.  No history of surgery or previous injury.  No history of similar pain.   Review of Systems See HPI   HISTORY: Past Medical, Surgical, Social, and Family History Reviewed & Updated per EMR.   Pertinent Historical Findings include:  Past Medical History:  Diagnosis Date  . Anemia, iron deficiency    With elevated hemoglobin A2  . Chicken pox   . Egg donor     Past Surgical History:  Procedure Laterality Date  . Denies surgical hx    . tuboovarian abcess  04-2015    Family History  Problem Relation Age of Onset  . Hyperlipidemia Mother   . Miscarriages / [place] Mother        Mother has 1 miscarriage  . Cancer Father        Lung Cancer  . Learning disabilities Sister        Sister has Cerebral Palsy and Epilepsy  . Mental retardation Sister   . Cancer Maternal Grandfather        Liver Cancer    Social History   Socioeconomic History  . Marital status: Single    Spouse name: Not on file  . Number of children: Not on file  . Years of education: Not on file  . Highest education level: Not on file  Occupational History  . Not on file  Tobacco Use  . Smoking status: Never Smoker  . Smokeless tobacco: Never Used  Vaping Use  . Vaping Use: Never used  Substance and Sexual Activity  . Alcohol use: No    Alcohol/week: 0.0 standard drinks  . Drug use: No  . Sexual activity: Yes    Birth control/protection: Injection  Other Topics Concern  . Not on file  Social History Narrative   hhof 2  and 2  plus dog   Working  Animator.   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  6 hours   40 work  And 12 credits .Marland Kitchen Done dec 16    No ets. Tobacco   caffiene  Once coke zero.   2-3    ETOH:  Wine weekends.    WALKING .     Working on knee  Problems  Has seen SM .  wendover  ocass aleve.       Social Determinants of Health   Financial Resource Strain:   . Difficulty of Paying Living Expenses: Not on file  Food Insecurity:   . Worried About Programme researcher, broadcasting/film/video in the Last Year: Not on file  . Ran Out of Food in the Last Year: Not on file  Transportation Needs:   . Lack of Transportation (Medical): Not on file  . Lack of Transportation (Non-Medical): Not on file  Physical Activity:   . Days of Exercise per Week: Not on file  . Minutes of Exercise per Session: Not on file  Stress:   . Feeling of Stress : Not  on file  Social Connections:   . Frequency of Communication with Friends and Family: Not on file  . Frequency of Social Gatherings with Friends and Family: Not on file  . Attends Religious Services: Not on file  . Active Member of Clubs or Organizations: Not on file  . Attends Banker Meetings: Not on file  . Marital Status: Not on file  Intimate Partner Violence:   . Fear of Current or Ex-Partner: Not on file  . Emotionally Abused: Not on file  . Physically Abused: Not on file  . Sexually Abused: Not on file     PHYSICAL EXAM:  VS: BP 118/82   Pulse 93   Ht 5\' 1"  (1.549 m)   Wt 180 lb (81.6 kg)   BMI 34.01 kg/m  Physical Exam Gen: NAD, alert, cooperative with exam, well-appearing MSK:  Left shoulder: Limitations in flexion and external rotation. Pain with external rotation and abduction. Pain with empty can testing. Pain with speeds test. Neurovascularly intact  Limited ultrasound: Left shoulder:  Normal-appearing biceps tendon but does have a half-moon effusion over the superior aspect and long pain. Normal-appearing subscapularis. Supraspinatus  with overlying bursal changes.  Some impingement noted on dynamic testing. Normal-appearing posterior glenohumeral joint.   Summary: Has findings of subacromial bursitis as well as fusion around the biceps tendon which could implicate a joint origin.  Ultrasound and interpretation by , MD    ASSESSMENT & PLAN:   Capsulitis of left shoulder Acute on chronic in nature.  Does have symptoms suggestive of more of a capsulitis with findings on ultrasound to suggest a subacromial bursitis as well.  This may be secondary to underlying capsular type pain. -Counseled on home exercise therapy and supportive care. -Duexis samples. -Could consider imaging or physical therapy.

## 2020-07-22 NOTE — Progress Notes (Signed)
Medication Samples have been provided to the patient.  Drug name: Duexis       Strength: 800mg /26.6mg         Qty: 1 box  LOT:  Exp.Date: 12/2020  Dosing instructions: Take 1 tablet by mouth three (3) times a day.  The patient has been instructed regarding the correct time, dose, and frequency of taking this medication, including desired effects and most common side effects.   01/2021, MA 9:09 AM 07/22/2020

## 2020-07-22 NOTE — Patient Instructions (Signed)
Nice to meet you  Please try heat before exercises and ice after  Please try the exercises  Please try the duexis for 3 days straight  Please send me a message in MyChart with any questions or updates.  Please see me back in 4 weeks.   --Dr. Jordan Likes

## 2020-08-16 ENCOUNTER — Ambulatory Visit (INDEPENDENT_AMBULATORY_CARE_PROVIDER_SITE_OTHER): Payer: BC Managed Care – PPO

## 2020-08-16 ENCOUNTER — Other Ambulatory Visit: Payer: Self-pay

## 2020-08-16 VITALS — BP 100/72 | HR 81 | Ht 61.0 in | Wt 189.0 lb

## 2020-08-16 DIAGNOSIS — Z3042 Encounter for surveillance of injectable contraceptive: Secondary | ICD-10-CM | POA: Diagnosis not present

## 2020-08-16 MED ORDER — MEDROXYPROGESTERONE ACETATE 150 MG/ML IM SUSP
150.0000 mg | Freq: Once | INTRAMUSCULAR | Status: AC
  Administered 2020-08-16: 150 mg via INTRAMUSCULAR

## 2020-08-16 NOTE — Progress Notes (Signed)
Patient was assessed and managed by nursing staff during this encounter. I have reviewed the chart and agree with the documentation and plan. I have also made any necessary editorial changes.  Jaynie Collins, MD 08/16/2020 12:36 PM

## 2020-08-16 NOTE — Progress Notes (Signed)
[name] here for Depo-Provera  Injection.  Injection administered without complication. Patient will return in 3 months for next injection.  [name] l [name], CMA 08/16/2020  8:32 AM

## 2020-08-24 ENCOUNTER — Other Ambulatory Visit: Payer: Self-pay

## 2020-08-24 ENCOUNTER — Ambulatory Visit: Payer: BC Managed Care – PPO | Admitting: Family Medicine

## 2020-08-24 ENCOUNTER — Encounter: Payer: Self-pay | Admitting: Family Medicine

## 2020-08-24 DIAGNOSIS — M778 Other enthesopathies, not elsewhere classified: Secondary | ICD-10-CM

## 2020-08-24 NOTE — Progress Notes (Signed)
[name] - 34 y.o. female MRN [id number]  Date of birth: 1985/12/02  SUBJECTIVE:  Including CC & ROS.  Chief Complaint  Patient presents with  . Follow-up    left shoulder    [name] is a 34 y.o. female that is following up for her left shoulder pain.  She does notice improvement in the pain and function but still has limitations in the range of motion.   Review of Systems See HPI   HISTORY: Past Medical, Surgical, Social, and Family History Reviewed & Updated per EMR.   Pertinent Historical Findings include:  Past Medical History:  Diagnosis Date  . Anemia, iron deficiency    With elevated hemoglobin A2  . Chicken pox   . Egg donor     Past Surgical History:  Procedure Laterality Date  . Denies surgical hx    . tuboovarian abcess  04-2015    Family History  Problem Relation Age of Onset  . Hyperlipidemia Mother   . Miscarriages / [place] Mother        Mother has 1 miscarriage  . Cancer Father        Lung Cancer  . Learning disabilities Sister        Sister has Cerebral Palsy and Epilepsy  . Mental retardation Sister   . Cancer Maternal Grandfather        Liver Cancer    Social History   Socioeconomic History  . Marital status: Single    Spouse name: Not on file  . Number of children: Not on file  . Years of education: Not on file  . Highest education level: Not on file  Occupational History  . Not on file  Tobacco Use  . Smoking status: Never Smoker  . Smokeless tobacco: Never Used  Vaping Use  . Vaping Use: Never used  Substance and Sexual Activity  . Alcohol use: No    Alcohol/week: 0.0 standard drinks  . Drug use: No  . Sexual activity: Yes    Birth control/protection: Injection  Other Topics Concern  . 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  6 hours   40 work  And 12 credits .Marland Kitchen Done dec 16    No ets. Tobacco   caffiene  Once coke zero.   2-3     ETOH:  Wine weekends.    WALKING .     Working on knee  Problems  Has seen SM .  wendover  ocass aleve.       Social Determinants of Health   Financial Resource Strain:   . Difficulty of Paying Living Expenses: Not on file  Food Insecurity:   . Worried About Programme researcher, broadcasting/film/video in the Last Year: Not on file  . Ran Out of Food in the Last Year: Not on file  Transportation Needs:   . Lack of Transportation (Medical): Not on file  . Lack of Transportation (Non-Medical): Not on file  Physical Activity:   . Days of Exercise per Week: Not on file  . Minutes of Exercise per Session: Not on file  Stress:   . Feeling of Stress : Not on file  Social Connections:   . Frequency of Communication with Friends and Family: Not on file  . Frequency of Social Gatherings with Friends and Family: Not on file  . Attends Religious  Services: Not on file  . Active Member of Clubs or Organizations: Not on file  . Attends Banker Meetings: Not on file  . Marital Status: Not on file  Intimate Partner Violence:   . Fear of Current or Ex-Partner: Not on file  . Emotionally Abused: Not on file  . Physically Abused: Not on file  . Sexually Abused: Not on file     PHYSICAL EXAM:  VS: BP 117/84   Pulse 79   Ht 5\' 2"  (1.575 m)   Wt 185 lb (83.9 kg)   BMI 33.84 kg/m  Physical Exam Gen: NAD, alert, cooperative with exam, well-appearing MSK:  Left shoulder: Limited flexion and abduction. Limited external rotation. Normal strength resistance. Neurovascular intact     ASSESSMENT & PLAN:   Capsulitis of left shoulder Having improvement in her motion and pain.  Still seems more capsular in nature with limited external rotation compared to the contralateral side. -Counseled on home exercise therapy and supportive care. -She will try Pilates. -Can consider injection or physical therapy.  Or further imaging.

## 2020-08-24 NOTE — Patient Instructions (Signed)
Good to see you  Please send me a message in MyChart with any questions or updates.  Please see me back in 2 months or as needed.   --Dr. Jordan Likes

## 2020-08-24 NOTE — Assessment & Plan Note (Signed)
Having improvement in her motion and pain.  Still seems more capsular in nature with limited external rotation compared to the contralateral side. -Counseled on home exercise therapy and supportive care. -She will try Pilates. -Can consider injection or physical therapy.  Or further imaging.

## 2020-09-16 ENCOUNTER — Encounter: Payer: Self-pay | Admitting: Internal Medicine

## 2020-10-22 ENCOUNTER — Ambulatory Visit: Payer: BC Managed Care – PPO | Admitting: Internal Medicine

## 2020-10-25 ENCOUNTER — Ambulatory Visit: Payer: BC Managed Care – PPO | Admitting: Internal Medicine

## 2020-10-29 NOTE — Progress Notes (Signed)
Chief Complaint  Patient presents with  . Follow-up    HPI: [name] 35 y.o. come in for Chronic disease management  Fu about her migraines and medications  MHA  Seems to be suppressed  Has taken steps for environ manipulation  Taking 150 Topamax at night  And nurtec   Working better prn migraine  About 3 x in 6 mos usuall awakens with them and med  Helps better than previous triptan where  Sx came back later over days.  No other change in health   Last gyne  Check  w in 2 years   On depo  And no periods  And taking iron   Not a HA trigger  Sleep ave 7  Tad some wine 2-3 per week.  Activity not much . Ordering peleton.   ROS: See pertinent positives and negatives per HPI. No cv oulm sx   Past Medical History:  Diagnosis Date  . Anemia, iron deficiency    With elevated hemoglobin A2  . Chicken pox   . Egg donor     Family History  Problem Relation Age of Onset  . Hyperlipidemia Mother   . Miscarriages / [place] Mother        Mother has 1 miscarriage  . Cancer Father        Lung Cancer  . Learning disabilities Sister        Sister has Cerebral Palsy and Epilepsy  . Mental retardation Sister   . Cancer Maternal Grandfather        Liver Cancer    Social History   Socioeconomic History  . Marital status: Single    Spouse name: Not on file  . Number of children: Not on file  . Years of education: Not on file  . Highest education level: Not on file  Occupational History  . Not on file  Tobacco Use  . Smoking status: Never Smoker  . Smokeless tobacco: Never Used  Vaping Use  . Vaping Use: Never used  Substance and Sexual Activity  . Alcohol use: No    Alcohol/week: 0.0 standard drinks  . Drug use: No  . Sexual activity: Yes    Birth control/protection: Injection  Other Topics Concern  . 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  6 hours   40 work  And 12 credits .Marland Kitchen  Done dec 16    No ets. Tobacco   caffiene  Once coke zero.   2-3    ETOH:  Wine weekends.    WALKING .     Working on knee  Problems  Has seen SM .  wendover  ocass aleve.       Social Determinants of Health   Financial Resource Strain: Not on file  Food Insecurity: Not on file  Transportation Needs: Not on file  Physical Activity: Not on file  Stress: Not on file  Social Connections: Not on file    Outpatient Medications Prior to Visit  Medication Sig Dispense Refill  . fexofenadine (ALLEGRA) 180 MG tablet Take 180 mg by mouth daily as needed for allergies.     . fluticasone (FLONASE) 50 MCG/ACT nasal spray Place 2 sprays into both nostrils daily. 16 g 6  . IRON PO Take 3 tablets by mouth at bedtime.    . NURTEC 75 MG TBDP TAKE  1 TABLET BY MOUTH AS DIRECTED 15 tablet 1  . Simethicone (GAS-X EXTRA STRENGTH PO) Take 1 tablet by mouth every 4 (four) hours as needed (gas).    . topiramate (TOPAMAX) 50 MG tablet TAKE 1 TABLET BY MOUTH EVERY MORNING AND 2 TABLETS EVERY NIGHT AT BEDTIME 90 tablet 3  . vitamin C (ASCORBIC ACID) 500 MG tablet Take 1,000 mg by mouth daily.     No facility-administered medications prior to visit.     EXAM:  BP 110/80 (BP Location: Right Arm, Patient Position: Sitting, Cuff Size: Large)   Pulse 73   Temp 98.8 F (37.1 C) (Oral)   Ht 5\' 2"  (1.575 m)   Wt 191 lb 9.6 oz (86.9 kg)   SpO2 98%   BMI 35.04 kg/m   Body mass index is 35.04 kg/m.  GENERAL: vitals reviewed and listed above, alert, oriented, appears well hydrated and in no acute distress HEENT: atraumatic, conjunctiva  clear, no obvious abnormalities on inspection of external nose and ears OP : masked  NECK: no obvious masses on inspection palpation  LUNGS: clear to auscultation bilaterally, no wheezes, rales or rhonchi, good air movement Abdomen:  Sof,t normal bowel sounds without hepatosplenomegaly, no guarding rebound or masses no CVA tenderness CV: HRRR, no clubbing cyanosis or   peripheral edema nl cap refill  MS: moves all extremities without noticeable focal  abnormality PSYCH: pleasant and cooperative, no obvious depression or anxiety Lab Results  Component Value Date   WBC 5.7 02/25/2020   HGB 12.7 02/25/2020   HCT 40.2 02/25/2020   PLT 258 02/25/2020   GLUCOSE 97 02/25/2020   CHOL 183 08/08/2017   TRIG 142.0 08/08/2017   HDL 34.80 (L) 08/08/2017   LDLCALC 120 (H) 08/08/2017   ALT 9 02/25/2020   AST 11 02/25/2020   NA 139 02/25/2020   K 4.2 02/25/2020   CL 108 (H) 02/25/2020   CREATININE 0.98 02/25/2020   BUN 9 02/25/2020   CO2 17 (L) 02/25/2020   TSH 2.220 02/25/2020   INR 1.17 05/17/2015   HGBA1C 5.5 02/25/2020   BP Readings from Last 3 Encounters:  11/01/20 110/80  08/24/20 117/84  08/16/20 100/72    ASSESSMENT AND PLAN:  Discussed the following assessment and plan:  Other migraine without status migrainosus, not intractable - Plan: Basic metabolic panel, CBC with Differential/Platelet, Hepatic function panel, Lipid panel, TSH  Medication management - Plan: Basic metabolic panel, CBC with Differential/Platelet, Hepatic function panel, Lipid panel, TSH  Hyperlipidemia, unspecified hyperlipidemia type - Plan: Basic metabolic panel, CBC with Differential/Platelet, Hepatic function panel, Lipid panel, TSH  History of anemia - Plan: Basic metabolic panel, CBC with Differential/Platelet, Hepatic function panel, Lipid panel, TSH Current regimen seems to be doing well  topamax for suppression and nurtec prn  . Continue lsi   Labs either with 08/18/20 lab or gyne  In about 6 mos and then ROV. Med check  lsi for healthy cv health in future Have  Pharmacy contact us for refills  electronically -Patient advised to return or notify health care team  if  new concerns arise.  Patient Instructions  [place]  You are doing ok. No change in meds  Advise  Lab work in about 6 months and then ROV .  Have pharmacy contact [facility] for refills electronically.       [place]. [name] M.D.

## 2020-11-01 ENCOUNTER — Ambulatory Visit: Payer: BC Managed Care – PPO | Admitting: Internal Medicine

## 2020-11-01 ENCOUNTER — Other Ambulatory Visit: Payer: Self-pay

## 2020-11-01 ENCOUNTER — Encounter: Payer: Self-pay | Admitting: Internal Medicine

## 2020-11-01 VITALS — BP 110/80 | HR 73 | Temp 98.8°F | Ht 62.0 in | Wt 191.6 lb

## 2020-11-01 DIAGNOSIS — Z862 Personal history of diseases of the blood and blood-forming organs and certain disorders involving the immune mechanism: Secondary | ICD-10-CM

## 2020-11-01 DIAGNOSIS — Z79899 Other long term (current) drug therapy: Secondary | ICD-10-CM | POA: Diagnosis not present

## 2020-11-01 DIAGNOSIS — G43809 Other migraine, not intractable, without status migrainosus: Secondary | ICD-10-CM

## 2020-11-01 DIAGNOSIS — E785 Hyperlipidemia, unspecified: Secondary | ICD-10-CM

## 2020-11-01 NOTE — Patient Instructions (Signed)
Glad  You are doing ok. No change in meds  Advise  Lab work in about 6 months and then ROV .  Have pharmacy contact us for refills electronically.

## 2020-11-11 ENCOUNTER — Other Ambulatory Visit: Payer: Self-pay | Admitting: Internal Medicine

## 2020-11-15 ENCOUNTER — Ambulatory Visit: Payer: BC Managed Care – PPO

## 2020-11-15 ENCOUNTER — Other Ambulatory Visit: Payer: Self-pay

## 2020-11-15 ENCOUNTER — Ambulatory Visit (INDEPENDENT_AMBULATORY_CARE_PROVIDER_SITE_OTHER): Payer: BC Managed Care – PPO

## 2020-11-15 VITALS — BP 102/79 | HR 82 | Wt 191.0 lb

## 2020-11-15 DIAGNOSIS — Z3042 Encounter for surveillance of injectable contraceptive: Secondary | ICD-10-CM

## 2020-11-15 MED ORDER — MEDROXYPROGESTERONE ACETATE 150 MG/ML IM SUSP
150.0000 mg | Freq: Once | INTRAMUSCULAR | Status: AC
Start: 2020-11-15 — End: 2020-11-15
  Administered 2020-11-15: 150 mg via INTRAMUSCULAR

## 2020-11-15 MED ORDER — MEDROXYPROGESTERONE ACETATE 150 MG/ML IM SUSP: 150.0000 mg | INTRAMUSCULAR | 0 refills | Status: DC

## 2020-11-15 NOTE — Progress Notes (Addendum)
[name] here for Depo-Provera  Injection.  Injection administered without complication. Patient will return in 3 months for next injection.  [name], CMA 11/15/2020  1:34 PM  Attestation of Attending Supervision of CMA/RN: Evaluation and management procedures were performed by the nurse under my supervision and collaboration.  I have reviewed the nursing note and chart, and I agree with the management and plan.  Carolyn L. Harraway-Smith, M.D., [facility]

## 2020-12-24 ENCOUNTER — Telehealth: Payer: Self-pay

## 2020-12-24 NOTE — Telephone Encounter (Signed)
Spoke with the patient in regards to her PA for Nurtec 75mg  and informed her to contact her pharmacy to update her insurance information.

## 2020-12-30 ENCOUNTER — Telehealth: Payer: Self-pay

## 2020-12-30 NOTE — Telephone Encounter (Signed)
Spoke with Covermymeds support regarding a prior authorization for the patient. I informed the support team that I have contacted the patient and informed her to contact her local pharmacy to update insurance information.

## 2021-01-31 ENCOUNTER — Ambulatory Visit (INDEPENDENT_AMBULATORY_CARE_PROVIDER_SITE_OTHER): Payer: BC Managed Care – PPO

## 2021-01-31 ENCOUNTER — Other Ambulatory Visit: Payer: Self-pay

## 2021-01-31 ENCOUNTER — Ambulatory Visit: Payer: BC Managed Care – PPO

## 2021-01-31 VITALS — BP 118/79 | HR 86 | Wt 194.0 lb

## 2021-01-31 DIAGNOSIS — Z3042 Encounter for surveillance of injectable contraceptive: Secondary | ICD-10-CM

## 2021-01-31 MED ORDER — MEDROXYPROGESTERONE ACETATE 150 MG/ML IM SUSP
150.0000 mg | Freq: Once | INTRAMUSCULAR | Status: AC
Start: 2021-01-31 — End: 2021-01-31
  Administered 2021-01-31: 150 mg via INTRAMUSCULAR

## 2021-01-31 MED ORDER — MEDROXYPROGESTERONE ACETATE 150 MG/ML IM SUSP: 150.0000 mg | INTRAMUSCULAR | 3 refills | Status: DC

## 2021-01-31 NOTE — Progress Notes (Signed)
[name] here for Depo-Provera  Injection.  Injection administered without complication. Patient will return in 3 months for next injection.  [name] l Misty Foutz, CMA 01/31/2021  11:39 AM

## 2021-01-31 NOTE — Progress Notes (Signed)
Patient seen and assessed by nursing staff.  Agree with documentation and plan.  

## 2021-02-10 ENCOUNTER — Ambulatory Visit: Payer: BC Managed Care – PPO | Admitting: Obstetrics & Gynecology

## 2021-03-07 ENCOUNTER — Other Ambulatory Visit: Payer: Self-pay | Admitting: Internal Medicine

## 2021-03-09 ENCOUNTER — Telehealth: Payer: Self-pay

## 2021-03-09 DIAGNOSIS — Z8669 Personal history of other diseases of the nervous system and sense organs: Secondary | ICD-10-CM

## 2021-03-09 NOTE — Telephone Encounter (Signed)
I initiated a PA for the pt's Nurtec 75mg  tablets. The PA was sent to plan and has been denied by . This PA decision can be appealed within 180 days of submission. [employer]. The PA was denied due to medication not being prescribed or recommended by doctor who specializes in brain and nerve disorders.

## 2021-03-09 NOTE — Addendum Note (Signed)
Addended by: Christy Sartorius on: 03/09/2021 02:09 PM   Modules accepted: Orders

## 2021-03-09 NOTE — Telephone Encounter (Signed)
So tell patient insurance information denial and reason.   Please refer to neurology or headache clinic for medicine evaluation for her migraines.

## 2021-03-09 NOTE — Telephone Encounter (Signed)
Left a message for the pt to return my call.  

## 2021-03-15 NOTE — Telephone Encounter (Signed)
Pt informed of denial of PA and that a referral to Neurology has been placed and someone should be reaching out with appointment information.

## 2021-03-15 NOTE — Telephone Encounter (Signed)
The patient was returning Mykal call

## 2021-03-16 ENCOUNTER — Ambulatory Visit (INDEPENDENT_AMBULATORY_CARE_PROVIDER_SITE_OTHER): Payer: 59 | Admitting: Obstetrics & Gynecology

## 2021-03-16 ENCOUNTER — Other Ambulatory Visit: Payer: Self-pay

## 2021-03-16 ENCOUNTER — Other Ambulatory Visit (HOSPITAL_COMMUNITY)
Admission: RE | Admit: 2021-03-16 | Discharge: 2021-03-16 | Disposition: A | Discharge status: Home / Self Care | Payer: 59 | Source: Ambulatory Visit | Attending: Obstetrics & Gynecology | Admitting: Obstetrics & Gynecology

## 2021-03-16 ENCOUNTER — Encounter: Payer: Self-pay | Admitting: Obstetrics & Gynecology

## 2021-03-16 VITALS — BP 107/74 | HR 80 | Wt 191.0 lb

## 2021-03-16 DIAGNOSIS — Z3009 Encounter for other general counseling and advice on contraception: Secondary | ICD-10-CM

## 2021-03-16 DIAGNOSIS — Z01419 Encounter for gynecological examination (general) (routine) without abnormal findings: Secondary | ICD-10-CM | POA: Insufficient documentation

## 2021-03-16 DIAGNOSIS — Z113 Encounter for screening for infections with a predominantly sexual mode of transmission: Secondary | ICD-10-CM | POA: Insufficient documentation

## 2021-03-16 NOTE — Progress Notes (Signed)
Subjective:     [name] is a 35 y.o. female here for a routine exam.  Current complaints: none. Pt is amenorrheic on the Depo Provera. She recently traveled to [place]. Her trip to [place] was cancelled due to Covid.     Gynecologic History No LMP recorded. Patient has had an injection. Contraception: Depo-Provera injections Last Pap: 10/04/2018. Results were: normal; HPV not done.  Last mammogram: n/a  Obstetric History OB History  Gravida Para Term Preterm AB Living  0 0 0 0 0 0  SAB IAB Ectopic Multiple Live Births  0 0 0 0     The following portions of the patient's history were reviewed and updated as appropriate: allergies, current medications, past family history, past medical history, past social history, past surgical history, and problem list.  Review of Systems Pertinent items are noted in HPI.    Objective:  BP 107/74   Pulse 80   Wt 191 lb (86.6 kg)   BMI 34.93 kg/m     General Appearance:    Alert, cooperative, no distress, appears stated age  Head:    Normocephalic, without obvious abnormality, atraumatic  Eyes:    conjunctiva/corneas clear, EOM's intact, both eyes  Ears:    Normal external ear canals, both ears  Nose:   Nares normal, septum midline, mucosa normal, no drainage    or sinus tenderness  Throat:   Lips, mucosa, and tongue normal; teeth and gums normal  Neck:   Supple, symmetrical, trachea midline, no adenopathy;    thyroid:  no enlargement/tenderness/nodules  Back:     Symmetric, no curvature, ROM normal, no CVA tenderness  Lungs:     respirations unlabored  Chest Wall:    No tenderness or deformity   Heart:    Regular rate and rhythm  Breast Exam:    No tenderness, masses, or nipple abnormality  Abdomen:     Soft, non-tender, bowel sounds active all four quadrants,    no masses, no organomegaly  Genitalia:    Normal female without lesion, discharge or tenderness     Extremities:   Extremities normal, atraumatic, no cyanosis or edema   Pulses:   2+ and symmetric all extremities  Skin:   Skin color, texture, turgor normal, no rashes or lesions     Assessment:    Healthy female exam.  STI screening.  Contraception counseling- Pt would like to keep Depo Provera.    Plan:   Diagnoses and all orders for this visit:  Well female exam with routine gynecological exam -     Cancel: Cytology - PAP( [place]) -     Cytology - PAP( [place])  Routine screening for STI (sexually transmitted infection) -     Cancel: Cytology - PAP( [place]) -     Cytology - PAP( )  Encounter for counseling regarding contraception   F/u in 1 year or sooner  Collins L. Harraway-Smith, M.D., [facility]

## 2021-03-17 ENCOUNTER — Encounter: Payer: Self-pay | Admitting: Neurology

## 2021-03-18 LAB — CYTOLOGY - PAP
Chlamydia: NEGATIVE
Comment: NEGATIVE
Comment: NEGATIVE
Comment: NORMAL
Diagnosis: NEGATIVE
High risk HPV: NEGATIVE
Neisseria Gonorrhea: NEGATIVE

## 2021-03-30 ENCOUNTER — Encounter: Payer: Self-pay | Admitting: Internal Medicine

## 2021-04-01 ENCOUNTER — Other Ambulatory Visit: Payer: Self-pay | Admitting: Internal Medicine

## 2021-04-18 ENCOUNTER — Ambulatory Visit: Payer: BC Managed Care – PPO

## 2021-04-25 ENCOUNTER — Other Ambulatory Visit: Payer: BC Managed Care – PPO

## 2021-05-02 ENCOUNTER — Other Ambulatory Visit: Payer: Self-pay

## 2021-05-02 ENCOUNTER — Ambulatory Visit (INDEPENDENT_AMBULATORY_CARE_PROVIDER_SITE_OTHER): Payer: 59

## 2021-05-02 ENCOUNTER — Ambulatory Visit: Payer: BC Managed Care – PPO | Admitting: Internal Medicine

## 2021-05-02 DIAGNOSIS — Z3042 Encounter for surveillance of injectable contraceptive: Secondary | ICD-10-CM | POA: Diagnosis not present

## 2021-05-02 MED ORDER — MEDROXYPROGESTERONE ACETATE 150 MG/ML IM SUSP
150.0000 mg | Freq: Once | INTRAMUSCULAR | Status: AC
  Administered 2021-05-02: 150 mg via INTRAMUSCULAR

## 2021-05-02 NOTE — Progress Notes (Signed)
Patient presents for Depo Provera injection. Patient tolerated injection well and supplied her own medication. Armandina Stammer NR

## 2021-05-09 ENCOUNTER — Ambulatory Visit: Payer: 59 | Admitting: Internal Medicine

## 2021-05-10 ENCOUNTER — Other Ambulatory Visit (INDEPENDENT_AMBULATORY_CARE_PROVIDER_SITE_OTHER): Payer: 59

## 2021-05-10 ENCOUNTER — Other Ambulatory Visit: Payer: Self-pay

## 2021-05-10 DIAGNOSIS — Z862 Personal history of diseases of the blood and blood-forming organs and certain disorders involving the immune mechanism: Secondary | ICD-10-CM

## 2021-05-10 DIAGNOSIS — Z79899 Other long term (current) drug therapy: Secondary | ICD-10-CM

## 2021-05-10 DIAGNOSIS — G43809 Other migraine, not intractable, without status migrainosus: Secondary | ICD-10-CM

## 2021-05-10 DIAGNOSIS — E785 Hyperlipidemia, unspecified: Secondary | ICD-10-CM | POA: Diagnosis not present

## 2021-05-10 LAB — CBC WITH DIFFERENTIAL/PLATELET
Basophils Absolute: 0 10*3/uL (ref 0.0–0.1)
Basophils Relative: 0.6 % (ref 0.0–3.0)
Eosinophils Absolute: 0.1 10*3/uL (ref 0.0–0.7)
Eosinophils Relative: 2.1 % (ref 0.0–5.0)
HCT: 37.5 % (ref 36.0–46.0)
Hemoglobin: 11.9 g/dL — ABNORMAL LOW (ref 12.0–15.0)
Lymphocytes Relative: 44.6 % (ref 12.0–46.0)
Lymphs Abs: 2.3 10*3/uL (ref 0.7–4.0)
MCHC: 31.8 g/dL (ref 30.0–36.0)
MCV: 77.4 fl — ABNORMAL LOW (ref 78.0–100.0)
Monocytes Absolute: 0.4 10*3/uL (ref 0.1–1.0)
Monocytes Relative: 7.7 % (ref 3.0–12.0)
Neutro Abs: 2.3 10*3/uL (ref 1.4–7.7)
Neutrophils Relative %: 45 % (ref 43.0–77.0)
Platelets: 243 10*3/uL (ref 150.0–400.0)
RBC: 4.85 Mil/uL (ref 3.87–5.11)
RDW: 15 % (ref 11.5–15.5)
WBC: 5.1 10*3/uL (ref 4.0–10.5)

## 2021-05-15 NOTE — Progress Notes (Signed)
Mild anemia again   will review at upcoming visit

## 2021-05-17 ENCOUNTER — Other Ambulatory Visit: Payer: Self-pay

## 2021-05-17 ENCOUNTER — Ambulatory Visit (INDEPENDENT_AMBULATORY_CARE_PROVIDER_SITE_OTHER): Payer: 59 | Admitting: Internal Medicine

## 2021-05-17 ENCOUNTER — Encounter: Payer: Self-pay | Admitting: Internal Medicine

## 2021-05-17 VITALS — BP 128/86 | HR 76 | Temp 98.5°F | Ht 62.0 in | Wt 190.0 lb

## 2021-05-17 DIAGNOSIS — Z79899 Other long term (current) drug therapy: Secondary | ICD-10-CM

## 2021-05-17 DIAGNOSIS — D649 Anemia, unspecified: Secondary | ICD-10-CM

## 2021-05-17 DIAGNOSIS — G43809 Other migraine, not intractable, without status migrainosus: Secondary | ICD-10-CM | POA: Diagnosis not present

## 2021-05-17 DIAGNOSIS — E785 Hyperlipidemia, unspecified: Secondary | ICD-10-CM | POA: Diagnosis not present

## 2021-05-17 NOTE — Patient Instructions (Signed)
Good to see  you today  Continue to track sleep food etc.  Keep neurology appt.    No change in meds at this time.   Plan lab  fasting preferred in 3 month s to include anemia iron studies cholesterol sugar levels.

## 2021-05-17 NOTE — Progress Notes (Signed)
Chief Complaint  Patient presents with   Follow-up     HPI: [name] 35 y.o. come in for Chronic disease management  Follow-up headaches and lab work taking iron on Depo no periods or bleeding takes iron Headaches partly controlled has appointment Coming in September HA awake  middle of night  take med and helps  for a while none  and then season change   once a week and then  last 2  months only one at most.  Nurtec quite helpful    2 hours .  No periods  since dep.  Works from home .  ROS: See pertinent positives and negatives per HPI.  Past Medical History:  Diagnosis Date   Anemia, iron deficiency    With elevated hemoglobin A2   Chicken pox    Egg donor     Family History  Problem Relation Age of Onset   Hyperlipidemia Mother    Miscarriages / [place] Mother        Mother has 1 miscarriage   Cancer Father        Lung Cancer   Learning disabilities Sister        Sister has Cerebral Palsy and Epilepsy   Mental retardation Sister    Cancer Maternal Grandfather        Liver Cancer    Social History   Socioeconomic History   Marital status: Single    Spouse name: Not on file   Number of children: Not on file   Years of education: Not on file   Highest education level: Not on file  Occupational History   Not on file  Tobacco Use   Smoking status: Never   Smokeless tobacco: Never  Vaping Use   Vaping Use: Never used  Substance and Sexual Activity   Alcohol use: No    Alcohol/week: 0.0 standard drinks   Drug use: No   Sexual activity: Yes    Birth control/protection: Injection  Other Topics Concern   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  6 hours   40 work  And 12 credits .Marland [employer] Done dec 16    No ets. Tobacco   caffiene  Once coke zero.   2-3    ETOH:  Wine weekends.    WALKING .     Working on knee  Problems  Has seen SM .  wendover  ocass aleve.       Social  Determinants of Health   Financial Resource Strain: Not on file  Food Insecurity: Not on file  Transportation Needs: Not on file  Physical Activity: Not on file  Stress: Not on file  Social Connections: Not on file    Outpatient Medications Prior to Visit  Medication Sig Dispense Refill   fexofenadine (ALLEGRA) 180 MG tablet Take 180 mg by mouth daily as needed for allergies.      fluticasone (FLONASE) 50 MCG/ACT nasal spray Place 2 sprays into both nostrils daily. 16 g 6   IRON PO Take 3 tablets by mouth at bedtime.     medroxyPROGESTERone (DEPO-PROVERA) 150 MG/ML injection Inject 1 mL (150 mg total) into the muscle every 3 (three) months. 1 mL 3   NURTEC 75 MG TBDP TAKE 1 TABLET BY MOUTH AS DIRECTED 15 tablet 1   Simethicone (GAS-X EXTRA STRENGTH PO) Take 1  tablet by mouth every 4 (four) hours as needed (gas).     topiramate (TOPAMAX) 50 MG tablet TAKE 1 TABLET BY MOUTH EVERY MORNING AND 2 TABLETS EVERY NIGHT AT BEDTIME 90 tablet 3   vitamin C (ASCORBIC ACID) 500 MG tablet Take 1,000 mg by mouth daily.     No facility-administered medications prior to visit.     EXAM:  BP 128/86 (BP Location: Left Arm, Patient Position: Sitting, Cuff Size: Normal)   Pulse 76   Temp 98.5 F (36.9 C) (Oral)   Ht 5\' 2"  (1.575 m)   Wt 190 lb (86.2 kg)   SpO2 98%   BMI 34.75 kg/m   Body mass index is 34.75 kg/m.  GENERAL: vitals reviewed and listed above, alert, oriented, appears well hydrated and in no acute distress HEENT: atraumatic, conjunctiva  clear, no obvious abnormalities on inspection of external nose and ears OP : Mast NECK: no obvious masses on inspection palpation  LUNGS: clear to auscultation bilaterally, no wheezes, rales or rhonchi, good air movement CV: HRRR, no clubbing cyanosis or  peripheral edema nl cap refill  MS: moves all extremities without noticeable focal  abnormality Neuro grossly normal Abdomen no guarding rebound or obvious masses. PSYCH: pleasant and  cooperative, no obvious depression or anxiety Lab Results  Component Value Date   WBC 5.1 05/10/2021   HGB 11.9 (L) 05/10/2021   HCT 37.5 05/10/2021   PLT 243.0 05/10/2021   GLUCOSE 97 02/25/2020   CHOL 183 08/08/2017   TRIG 142.0 08/08/2017   HDL 34.80 (L) 08/08/2017   LDLCALC 120 (H) 08/08/2017   ALT 9 02/25/2020   AST 11 02/25/2020   NA 139 02/25/2020   K 4.2 02/25/2020   CL 108 (H) 02/25/2020   CREATININE 0.98 02/25/2020   BUN 9 02/25/2020   CO2 17 (L) 02/25/2020   TSH 2.220 02/25/2020   INR 1.17 05/17/2015   HGBA1C 5.5 02/25/2020   BP Readings from Last 3 Encounters:  05/17/21 128/86  03/16/21 107/74  01/31/21 118/79    ASSESSMENT AND PLAN:  Discussed the following assessment and plan:  Other migraine without status migrainosus, not intractable - Plan: Basic metabolic panel, CBC with Differential/Platelet, Hepatic function panel, Lipid panel, TSH, IBC + Ferritin  Medication management - Plan: Basic metabolic panel, CBC with Differential/Platelet, Hepatic function panel, Lipid panel, TSH, IBC + Ferritin  Anemia, unspecified type - Plan: Basic metabolic panel, CBC with Differential/Platelet, Hepatic function panel, Lipid panel, TSH, IBC + Ferritin  Hyperlipidemia, unspecified hyperlipidemia type - Plan: Basic metabolic panel, CBC with Differential/Platelet, Hepatic function panel, Lipid panel, TSH, IBC + Ferritin  Dyslipidemia (high LDL; low HDL) - Plan: Basic metabolic panel, CBC with Differential/Platelet, Hepatic function panel, Lipid panel, TSH, IBC + Ferritin  Season change .  May be 1 trigger look again at foods other lifestyle continue the Topamax and Nurtec as needed Keep appointment with neurology for other advice  Unfortuantly orders were expired and  error  in obtaining and only cbc was done plan future labs we will get iron levels chemistries lipids.  Etc. -Patient advised to return or notify health care team  if  new concerns arise.  Patient  Instructions  Good to see  you today  Continue to track sleep food etc.  Keep neurology appt.    No change in meds at this time.   Plan lab  fasting preferred in 3 month s to include anemia iron studies cholesterol sugar levels.   04/02/21. Makenly Larabee  M.D.  

## 2021-06-01 NOTE — Progress Notes (Signed)
NEUROLOGY CONSULTATION NOTE  [name] MRN: [id number] DOB: 10/19/1985  Referring provider: Berniece Andreas, MD Primary care provider: Berniece Andreas, MD  Reason for consult:  migraines  Assessment/Plan:   Probable migraine without aura, however she also has some features of cluster headache as well  Migraine prevention:  topiramate 50mg  in AM and 100mg  at night. Migraine rescue:  Nurtec - advised to use as first line Limit use of pain relievers to no more than 2 days out of week to prevent risk of rebound or medication-overuse headache. Keep headache diary Follow up one year    Subjective:  [name] is a 35 year old right-handed female who presents for migraines.  History supplemented by referring provider's note.  Onset:  35 years old Location:  usually left-sided/orbital Quality:  pressure/throbbing Intensity:  7/10 Aura:  absent Prodrome:  absent Associated symptoms:  Left nasal congestion, photophobia.  She denies nausea, vomiting, ptosis, conjunctival injection, lacrimation, phonophobia, visual disturbance, associated unilateral numbness or weakness. Duration:  Within 90 min with Nurtec. Often occurs in middle of night - it wakes her up Frequency:  Varies.  More frequent in Spring-early Summer. Rarely winter.  Over the summer, 3 migraines Frequency of abortive medication: 3 times in 3 months Triggers:  seasonal allergies, dehydration Relieving factors:  none Activity:  Needs to lay still.  Rescue protocol:  wakes up with migraine.  Takes Excedrin 1s line.  If no improvement in 3 hours, then Nurtec Current NSAIDS/analgesics:  Excedrin.  Rarely Goody Current triptans:  none Current ergotamine:  none Current anti-emetic:  none Current muscle relaxants:  none Current Antihypertensive medications:  none Current Antidepressant medications:  none Current Anticonvulsant medications:  topiramate 50mg  in AM and 100mg  QHS Current anti-CGRP:  Nurtec (rescue) Current  Vitamins/Herbal/Supplements:  iron, C Current Antihistamines/Decongestants:  Flonase, Allegra Other therapy:  none Hormone/birth control:  Depo-Provera   Past NSAIDS/analgesics:  acetaminophen Past abortive triptans:  eletriptan, rizatriptan, sumatriptan tab Past abortive ergotamine:  none Past muscle relaxants:  none Past anti-emetic:  none Past antihypertensive medications:  none Past antidepressant medications:  none Past anticonvulsant medications:  none Past anti-CGRP:  none Past vitamins/Herbal/Supplements:  none Past antihistamines/decongestants:  none Other past therapies:  none  Caffeine:  usually 1 to cups coffee daily.   Alcohol:  Rarely.  Not a trigger Diet:  Hydrates.  Does not skip meals Exercise:  she tries Depression:  no; Anxiety:  no Other pain:  no Sleep hygiene:  good Family history of headache:  maternal aunt, maternal cousin      PAST MEDICAL HISTORY: Past Medical History:  Diagnosis Date   Anemia, iron deficiency    With elevated hemoglobin A2   Chicken pox    Egg donor     PAST SURGICAL HISTORY: Past Surgical History:  Procedure Laterality Date   Denies surgical hx     tuboovarian abcess  04-2015    MEDICATIONS: Current Outpatient Medications on File Prior to Visit  Medication Sig Dispense Refill   fexofenadine (ALLEGRA) 180 MG tablet Take 180 mg by mouth daily as needed for allergies.      fluticasone (FLONASE) 50 MCG/ACT nasal spray Place 2 sprays into both nostrils daily. 16 g 6   IRON PO Take 3 tablets by mouth at bedtime.     medroxyPROGESTERone (DEPO-PROVERA) 150 MG/ML injection Inject 1 mL (150 mg total) into the muscle every 3 (three) months. 1 mL 3   NURTEC 75 MG TBDP TAKE 1 TABLET BY MOUTH AS DIRECTED  15 tablet 1   Simethicone (GAS-X EXTRA STRENGTH PO) Take 1 tablet by mouth every 4 (four) hours as needed (gas).     topiramate (TOPAMAX) 50 MG tablet TAKE 1 TABLET BY MOUTH EVERY MORNING AND 2 TABLETS EVERY NIGHT AT BEDTIME 90  tablet 3   vitamin C (ASCORBIC ACID) 500 MG tablet Take 1,000 mg by mouth daily.     No current facility-administered medications on file prior to visit.    ALLERGIES: No Known Allergies  FAMILY HISTORY: Family History  Problem Relation Age of Onset   Hyperlipidemia Mother    Miscarriages / [place] Mother        Mother has 1 miscarriage   Cancer Father        Lung Cancer   Learning disabilities Sister        Sister has Cerebral Palsy and Epilepsy   Mental retardation Sister    Cancer Maternal Grandfather        Liver Cancer    Objective:  Blood pressure 118/81, pulse 76, height 5\' 1"  (1.549 m), weight 191 lb 3.2 oz (86.7 kg), SpO2 100 %. General: No acute distress.  Patient appears well-groomed.   Head:  Normocephalic/atraumatic Eyes:  fundi examined but not visualized Neck: supple, no paraspinal tenderness, full range of motion Back: No paraspinal tenderness Heart: regular rate and rhythm Lungs: Clear to auscultation bilaterally. Vascular: No carotid bruits. Neurological Exam: Mental status: alert and oriented to person, place, and time, recent and remote memory intact, fund of knowledge intact, attention and concentration intact, speech fluent and not dysarthric, language intact. Cranial nerves: CN I: not tested CN II: pupils equal, round and reactive to light, visual fields intact CN III, IV, VI:  full range of motion, no nystagmus, no ptosis CN V: facial sensation intact. CN VII: upper and lower face symmetric CN VIII: hearing intact CN IX, X: gag intact, uvula midline CN XI: sternocleidomastoid and trapezius muscles intact CN XII: tongue midline Bulk & Tone: normal, no fasciculations. Motor:  muscle strength 5/5 throughout Sensation:  Pinprick, temperature and vibratory sensation intact. Deep Tendon Reflexes:  2+ throughout,  toes downgoing.   Finger to nose testing:  Without dysmetria.   Heel to shin:  Without dysmetria.   Gait:  Normal station and  stride.  Romberg negative.    Thank you for allowing me to take part in the care of this patient.  , DO  CC: Shon Millet, MD

## 2021-06-03 ENCOUNTER — Other Ambulatory Visit: Payer: Self-pay

## 2021-06-03 ENCOUNTER — Encounter: Payer: Self-pay | Admitting: Neurology

## 2021-06-03 ENCOUNTER — Ambulatory Visit: Payer: 59 | Admitting: Neurology

## 2021-06-03 VITALS — BP 118/81 | HR 76 | Ht 61.0 in | Wt 191.2 lb

## 2021-06-03 DIAGNOSIS — G43009 Migraine without aura, not intractable, without status migrainosus: Secondary | ICD-10-CM

## 2021-06-03 MED ORDER — NURTEC 75 MG PO TBDP: 1.0000 | ORAL_TABLET | Freq: Every day | ORAL | 5 refills | Status: DC | PRN

## 2021-06-03 NOTE — Patient Instructions (Signed)
  Continue topiramate 50mg  in morning and 100mg  at night Take Nurtec at earliest onset of headache.  Maximum 1 tablet in 24 hours. Limit use of pain relievers to no more than 2 days out of the week.  These medications include acetaminophen, NSAIDs (ibuprofen/Advil/Motrin, naproxen/Aleve, triptans (Imitrex/sumatriptan), Excedrin, and narcotics.  This will help reduce risk of rebound headaches. Be aware of common food triggers:  - Caffeine:  coffee, black tea, cola, Mt. Dew  - Chocolate  - Dairy:  aged cheeses (brie, blue, cheddar, gouda, [place], provolone, Cash, Swiss, etc), chocolate milk, buttermilk, sour cream, limit eggs and yogurt  - Nuts, peanut butter  - Alcohol  - Cereals/grains:  FRESH breads (fresh bagels, sourdough, doughnuts), yeast productions  - Processed/canned/aged/cured meats (pre-packaged deli meats, hotdogs)  - MSG/glutamate:  soy sauce, flavor enhancer, pickled/preserved/marinated foods  - Sweeteners:  aspartame (Equal, Nutrasweet).  Sugar and Splenda are okay  - Vegetables:  legumes (lima beans, lentils, snow peas, fava beans, pinto peans, peas, garbanzo beans), sauerkraut, onions, olives, pickles  - Fruit:  avocados, bananas, citrus fruit (orange, lemon, grapefruit), mango  - Other:  Frozen meals, macaroni and cheese Routine exercise Stay adequately hydrated (aim for 64 oz water daily) Keep headache diary Maintain proper stress management Maintain proper sleep hygiene Do not skip meals Consider supplements:  magnesium citrate 400mg  daily, riboflavin 400mg  daily, coenzyme Q10 100mg  three times daily.

## 2021-06-27 ENCOUNTER — Telehealth: Payer: Self-pay

## 2021-06-27 NOTE — Telephone Encounter (Signed)
F/u   Received fax [facility]   Nurtec 75 mg is approved for 3 months valid through 06/24/21 to 09/23/21.

## 2021-06-27 NOTE — Telephone Encounter (Signed)
New message   [name] (Key: [id number]) Nurtec 75MG  dispersible tablets   Form [facility] Electronic Prior Authorization Form (2017 NCPDP) Created 13 minutes ago Sent to Plan 11 minutes ago Plan Response 10 minutes ago Submit Clinical Questions 2 minutes ago Determination Wait for Determination Please wait for [facility] 2017 NCPDP to return a determination.

## 2021-07-04 ENCOUNTER — Telehealth: Payer: Self-pay

## 2021-07-04 MED ORDER — TOPIRAMATE 50 MG PO TABS
ORAL_TABLET | ORAL | 3 refills | Status: DC
Start: 2021-07-04 — End: 2021-10-12

## 2021-07-04 NOTE — Addendum Note (Signed)
Addended by: Christy Sartorius on: 07/04/2021 01:44 PM   Modules accepted: Orders

## 2021-07-04 NOTE — Telephone Encounter (Signed)
Rx sent 

## 2021-07-04 NOTE — Telephone Encounter (Signed)
Patient called asking for Rx refill topiramate (TOPAMAX) 50 MG tablet Be sent to [facility] so that her insurance will cover

## 2021-08-01 ENCOUNTER — Other Ambulatory Visit: Payer: Self-pay

## 2021-08-01 ENCOUNTER — Ambulatory Visit (INDEPENDENT_AMBULATORY_CARE_PROVIDER_SITE_OTHER): Payer: 59

## 2021-08-01 VITALS — BP 106/74 | HR 72 | Wt 191.0 lb

## 2021-08-01 DIAGNOSIS — Z3042 Encounter for surveillance of injectable contraceptive: Secondary | ICD-10-CM | POA: Diagnosis not present

## 2021-08-01 DIAGNOSIS — Z23 Encounter for immunization: Secondary | ICD-10-CM

## 2021-08-01 MED ORDER — MEDROXYPROGESTERONE ACETATE 150 MG/ML IM SUSP
150.0000 mg | Freq: Once | INTRAMUSCULAR | Status: AC
  Administered 2021-08-01: 150 mg via INTRAMUSCULAR

## 2021-08-01 NOTE — Progress Notes (Addendum)
Patient received Depo Provera for contraception. Patient is up to date on her annual exam. Patient requested  her Flu vaccine. Armandina Stammer RN   Attestation of Attending Supervision of RN: Evaluation and management procedures were performed by the nurse under my supervision and collaboration.  I have reviewed the nursing note and chart, and I agree with the management and plan.  Carolyn L. Harraway-Smith, M.D., [place]n Core

## 2021-08-04 ENCOUNTER — Ambulatory Visit: Payer: 59 | Attending: Internal Medicine

## 2021-08-04 DIAGNOSIS — Z23 Encounter for immunization: Secondary | ICD-10-CM

## 2021-08-04 NOTE — Progress Notes (Signed)
   Covid-19 Vaccination Clinic  Name:  Drenda Sobecki    MRN: [id number] DOB: December 05, 1985  08/04/2021  Ms. Stroope was observed post Covid-19 immunization for 15 minutes without incident. She was provided with Vaccine Information Sheet and instruction to access the V-Safe system.   Ms. Payson was instructed to call 911 with any severe reactions post vaccine: Difficulty breathing  Swelling of face and throat  A fast heartbeat  A bad rash all over body  Dizziness and weakness   Immunizations Administered     Name Date Dose VIS Date Route   Pfizer Covid-19 Vaccine Bivalent Booster 08/04/2021 11:03 AM 0.3 mL 05/25/2021 Intramuscular   Manufacturer: [employer], [employer]   Lot: [id number]   NDC: [phone]

## 2021-08-10 ENCOUNTER — Other Ambulatory Visit (INDEPENDENT_AMBULATORY_CARE_PROVIDER_SITE_OTHER): Payer: 59

## 2021-08-10 DIAGNOSIS — Z79899 Other long term (current) drug therapy: Secondary | ICD-10-CM

## 2021-08-10 DIAGNOSIS — E785 Hyperlipidemia, unspecified: Secondary | ICD-10-CM

## 2021-08-10 DIAGNOSIS — D649 Anemia, unspecified: Secondary | ICD-10-CM | POA: Diagnosis not present

## 2021-08-10 DIAGNOSIS — G43809 Other migraine, not intractable, without status migrainosus: Secondary | ICD-10-CM | POA: Diagnosis not present

## 2021-08-10 LAB — IBC + FERRITIN
Ferritin: 187.9 ng/mL (ref 10.0–291.0)
Iron: 155 ug/dL — ABNORMAL HIGH (ref 42–145)
Saturation Ratios: 52 % — ABNORMAL HIGH (ref 20.0–50.0)
TIBC: 298.2 ug/dL (ref 250.0–450.0)
Transferrin: 213 mg/dL (ref 212.0–360.0)

## 2021-08-10 LAB — CBC WITH DIFFERENTIAL/PLATELET
Basophils Absolute: 0 10*3/uL (ref 0.0–0.1)
Basophils Relative: 1 % (ref 0.0–3.0)
Eosinophils Absolute: 0.2 10*3/uL (ref 0.0–0.7)
Eosinophils Relative: 3.1 % (ref 0.0–5.0)
HCT: 36.9 % (ref 36.0–46.0)
Hemoglobin: 11.8 g/dL — ABNORMAL LOW (ref 12.0–15.0)
Lymphocytes Relative: 48.9 % — ABNORMAL HIGH (ref 12.0–46.0)
Lymphs Abs: 2.4 10*3/uL (ref 0.7–4.0)
MCHC: 32 g/dL (ref 30.0–36.0)
MCV: 76.6 fl — ABNORMAL LOW (ref 78.0–100.0)
Monocytes Absolute: 0.4 10*3/uL (ref 0.1–1.0)
Monocytes Relative: 7.3 % (ref 3.0–12.0)
Neutro Abs: 2 10*3/uL (ref 1.4–7.7)
Neutrophils Relative %: 39.7 % — ABNORMAL LOW (ref 43.0–77.0)
Platelets: 229 10*3/uL (ref 150.0–400.0)
RBC: 4.82 Mil/uL (ref 3.87–5.11)
RDW: 15.4 % (ref 11.5–15.5)
WBC: 4.9 10*3/uL (ref 4.0–10.5)

## 2021-08-10 LAB — HEPATIC FUNCTION PANEL
ALT: 10 U/L (ref 0–35)
AST: 12 U/L (ref 0–37)
Albumin: 4.3 g/dL (ref 3.5–5.2)
Alkaline Phosphatase: 52 U/L (ref 39–117)
Bilirubin, Direct: 0.1 mg/dL (ref 0.0–0.3)
Total Bilirubin: 0.4 mg/dL (ref 0.2–1.2)
Total Protein: 7.5 g/dL (ref 6.0–8.3)

## 2021-08-10 LAB — LIPID PANEL
Cholesterol: 185 mg/dL (ref 0–200)
HDL: 31.3 mg/dL — ABNORMAL LOW (ref >39.00)
LDL Cholesterol: 126 mg/dL — ABNORMAL HIGH (ref 0–99)
NonHDL: 153.64
Total CHOL/HDL Ratio: 6
Triglycerides: 138 mg/dL (ref 0.0–149.0)
VLDL: 27.6 mg/dL (ref 0.0–40.0)

## 2021-08-10 LAB — BASIC METABOLIC PANEL
BUN: 10 mg/dL (ref 6–23)
CO2: 20 mEq/L (ref 19–32)
Calcium: 9.1 mg/dL (ref 8.4–10.5)
Chloride: 110 mEq/L (ref 96–112)
Creatinine, Ser: 0.9 mg/dL (ref 0.40–1.20)
GFR: 81.94 mL/min (ref >60.00)
Glucose, Bld: 85 mg/dL (ref 70–99)
Potassium: 3.6 mEq/L (ref 3.5–5.1)
Sodium: 138 mEq/L (ref 135–145)

## 2021-08-10 LAB — TSH: TSH: 1.32 u[IU]/mL (ref 0.35–5.50)

## 2021-08-22 NOTE — Progress Notes (Signed)
Anemia stable iron is not low. Kidney function thyroid liver and blood sugar normal Cholesterol about the same low HDL encourage increase exercise healthy weight loss to improve these numbers and check yearly T

## 2021-08-23 ENCOUNTER — Other Ambulatory Visit (HOSPITAL_BASED_OUTPATIENT_CLINIC_OR_DEPARTMENT_OTHER): Payer: Self-pay

## 2021-08-23 MED ORDER — PFIZER COVID-19 VAC BIVALENT 30 MCG/0.3ML IM SUSP
INTRAMUSCULAR | 0 refills | Status: DC
  Filled 2021-08-23: qty 0.3, 1d supply, fill #0

## 2021-09-01 ENCOUNTER — Other Ambulatory Visit: Payer: Self-pay | Admitting: Internal Medicine

## 2021-09-28 ENCOUNTER — Telehealth: Payer: Self-pay | Admitting: Internal Medicine

## 2021-09-28 DIAGNOSIS — E785 Hyperlipidemia, unspecified: Secondary | ICD-10-CM

## 2021-09-28 NOTE — Telephone Encounter (Signed)
Yes ok and please place referral

## 2021-09-28 NOTE — Telephone Encounter (Signed)
Referral entered  

## 2021-09-28 NOTE — Telephone Encounter (Signed)
Patient called because she would like a referral to a dietician.   Please send to [facility] at [address], [place], [place] [address]     Good callback number for patient is [phone]   Please advise

## 2021-09-28 NOTE — Telephone Encounter (Signed)
Okay to place referral

## 2021-09-30 ENCOUNTER — Telehealth: Payer: Self-pay

## 2021-09-30 NOTE — Telephone Encounter (Signed)
New message    Prior Authorization form completed fax back to  [phone]  Attached clinical notes

## 2021-10-05 NOTE — Telephone Encounter (Signed)
F/u  Received fax from [facility]   Effective date  1.3.2023  to  1.3.2024  Nurtec tablets

## 2021-10-11 ENCOUNTER — Other Ambulatory Visit: Payer: Self-pay | Admitting: Internal Medicine

## 2021-10-24 ENCOUNTER — Ambulatory Visit (INDEPENDENT_AMBULATORY_CARE_PROVIDER_SITE_OTHER): Payer: 59

## 2021-10-24 ENCOUNTER — Other Ambulatory Visit: Payer: Self-pay

## 2021-10-24 VITALS — BP 108/71 | HR 75

## 2021-10-24 DIAGNOSIS — Z3042 Encounter for surveillance of injectable contraceptive: Secondary | ICD-10-CM | POA: Diagnosis not present

## 2021-10-24 MED ORDER — MEDROXYPROGESTERONE ACETATE 150 MG/ML IM SUSP
150.0000 mg | Freq: Once | INTRAMUSCULAR | Status: AC
Start: 2021-10-24 — End: 2021-10-24
  Administered 2021-10-24: 150 mg via INTRAMUSCULAR

## 2021-10-24 NOTE — Progress Notes (Addendum)
Patient presents for Depo Provera Up to date on annual exam - last annual June 22,2022.  Last injection 08/01/2021. Armandina Stammer RN   I reviewed the patient history and course with the nurse. I agree with the documentation and plan as noted.   Milas Hock, MD

## 2021-12-28 NOTE — Progress Notes (Signed)
? ?NEUROLOGY FOLLOW UP OFFICE NOTE ? ?[name] ?[id number] ? ?Assessment/Plan:  ? ?Probable migraine without aura, however she also has some features of cluster headache as well - she has had increase frequency over the past month likely affected by frequent travelling.  She is no longer travelling, so we will see if headaches decrease.  If not, will increase topiramate to 100mg  BID.  In meantime: ?  ?Migraine prevention:  topiramate 50mg  in AM and 100mg  at night. ?Migraine rescue:  Nurtec - advised to use as first line ?Limit use of pain relievers to no more than 2 days out of week to prevent risk of rebound or medication-overuse headache. ?Keep headache diary ?Follow up 6 months. ?  ?  ?  ?Subjective:  ?[name] is a 36 year old right-handed female who follows up for migraines. ? ?UPDATE: ?In last month she reports more headaches over past month.  She has been travelling more and exposed to frequently changing environments.   ?Intensity:  5-7/10 ?Duration:  1-2 hours ?Frequency:  4 in last 30 days ? ?Rescue protocol:  Nurtec first line ?Current NSAIDS/analgesics:  none ?Current triptans:  none ?Current ergotamine:  none ?Current anti-emetic:  none ?Current muscle relaxants:  none ?Current Antihypertensive medications:  none ?Current Antidepressant medications:  none ?Current Anticonvulsant medications:  topiramate 50mg  in AM and 100mg  QHS ?Current anti-CGRP:  Nurtec (rescue) ?Current Vitamins/Herbal/Supplements:  iron, C ?Current Antihistamines/Decongestants:  Flonase, Allegra ?Other therapy:  none ?Hormone/birth control:  Depo-Provera ? ? ?Caffeine:  usually 1 to cups coffee daily.   ?Alcohol:  Rarely.  Not a trigger ?Diet:  Hydrates.  Does not skip meals ?Exercise:  she tries ?Depression:  no; Anxiety:  no ?Other pain:  no ?Sleep hygiene:  good ? ?HISTORY:  ?Onset:  36 years old ?Location:  usually left-sided/orbital ?Quality:  pressure/throbbing ?Initial Intensity:  7/10 ?Aura:  absent ?Prodrome:   absent ?Associated symptoms:  Left nasal congestion, photophobia.  She denies nausea, vomiting, ptosis, conjunctival injection, lacrimation, phonophobia, visual disturbance, associated unilateral numbness or weakness. ?Initial Duration:  Within 90 min with Nurtec. Often occurs in middle of night - it wakes her up ?Initial Frequency:  Varies.  More frequent in Spring-early Summer. Rarely winter.  Over the summer, 3 migraines ?Initial Frequency of abortive medication: 3 times in 3 months ?Triggers:  seasonal allergies, dehydration ?Relieving factors:  none ?Activity:  Needs to lay still. ? ?  ?Past NSAIDS/analgesics:  acetaminophen, Excedrin.  Rarely Goody ?Past abortive triptans:  eletriptan, rizatriptan, sumatriptan tab ?Past abortive ergotamine:  none ?Past muscle relaxants:  none ?Past anti-emetic:  none ?Past antihypertensive medications:  none ?Past antidepressant medications:  none ?Past anticonvulsant medications:  none ?Past anti-CGRP:  none ?Past vitamins/Herbal/Supplements:  none ?Past antihistamines/decongestants:  none ?Other past therapies:  none ?  ? ?Family history of headache:  maternal aunt, maternal cousin ? ?PAST MEDICAL HISTORY: ?Past Medical History:  ?Diagnosis Date  ? Anemia, iron deficiency   ? With elevated hemoglobin A2  ? Chicken pox   ? Egg donor   ? ? ?MEDICATIONS: ?Current Outpatient Medications on File Prior to Visit  ?Medication Sig Dispense Refill  ? COVID-19 mRNA bivalent vaccine, Pfizer, (PFIZER COVID-19 VAC BIVALENT) injection Inject into the muscle. 0.3 mL 0  ? fexofenadine (ALLEGRA) 180 MG tablet Take 180 mg by mouth daily as needed for allergies.     ? fluticasone (FLONASE) 50 MCG/ACT nasal spray Place 2 sprays into both nostrils daily. 16 g 6  ? IRON PO  Take 3 tablets by mouth at bedtime.    ? medroxyPROGESTERone (DEPO-PROVERA) 150 MG/ML injection Inject 1 mL (150 mg total) into the muscle every 3 (three) months. 1 mL 3  ? Rimegepant Sulfate (NURTEC) 75 MG TBDP Take 1 tablet by  mouth daily as needed. 16 tablet 5  ? Simethicone (GAS-X EXTRA STRENGTH PO) Take 1 tablet by mouth every 4 (four) hours as needed (gas).    ? topiramate (TOPAMAX) 50 MG tablet TAKE 1 TABLET BY MOUTH IN  THE MORNING AND 2 TABLETS  BY MOUTH EVERY NIGHT AT  BEDTIME 90 tablet 11  ? vitamin C (ASCORBIC ACID) 500 MG tablet Take 1,000 mg by mouth daily.    ? ?No current facility-administered medications on file prior to visit.  ? ? ?ALLERGIES: ?No Known Allergies ? ?FAMILY HISTORY: ?Family History  ?Problem Relation Age of Onset  ? Hyperlipidemia Mother   ? Miscarriages / [place] Mother   ?     Mother has 1 miscarriage  ? Cancer Father   ?     Lung Cancer  ? Learning disabilities Sister   ?     Sister has Cerebral Palsy and Epilepsy  ? Mental retardation Sister   ? Cancer Maternal Grandfather   ?     Liver Cancer  ? ? ?  ?Objective:  ?Blood pressure (!) 134/93, pulse 70, height 5\' 1"  (1.549 m), weight 195 lb 12.8 oz (88.8 kg), SpO2 93 %. ?General: No acute distress.  Patient appears well-groomed.   ? ? ? ? , DO ? ?CC: Shon Millet, MD ? ? ? ? ? ? ?

## 2021-12-29 ENCOUNTER — Encounter: Payer: Self-pay | Admitting: General Practice

## 2022-01-02 ENCOUNTER — Encounter: Payer: Self-pay | Admitting: Neurology

## 2022-01-02 ENCOUNTER — Ambulatory Visit (INDEPENDENT_AMBULATORY_CARE_PROVIDER_SITE_OTHER): Payer: 59 | Admitting: Neurology

## 2022-01-02 VITALS — BP 134/93 | HR 70 | Ht 61.0 in | Wt 195.8 lb

## 2022-01-02 DIAGNOSIS — G43009 Migraine without aura, not intractable, without status migrainosus: Secondary | ICD-10-CM

## 2022-01-02 MED ORDER — NURTEC 75 MG PO TBDP
1.0000 | ORAL_TABLET | Freq: Every day | ORAL | 5 refills | Status: DC | PRN
Start: 2022-01-02 — End: 2022-07-04

## 2022-01-02 NOTE — Patient Instructions (Signed)
Continue topiramate 50mg  in morning and 100mg  at night.  If headaches continue, contact me ?Nurtec first line for migraine attack ?Limit use of pain relievers to no more than 2 days out of week to prevent risk of rebound or medication-overuse headache. ?Keep headache diary ?Follow up 6 months. ?

## 2022-01-17 ENCOUNTER — Other Ambulatory Visit: Payer: Self-pay

## 2022-01-17 ENCOUNTER — Other Ambulatory Visit: Payer: Self-pay | Admitting: Family Medicine

## 2022-01-17 ENCOUNTER — Ambulatory Visit: Payer: 59

## 2022-01-17 DIAGNOSIS — Z3042 Encounter for surveillance of injectable contraceptive: Secondary | ICD-10-CM

## 2022-01-17 MED ORDER — MEDROXYPROGESTERONE ACETATE 150 MG/ML IM SUSP: 150.0000 mg | INTRAMUSCULAR | 3 refills | Status: DC

## 2022-01-17 NOTE — Progress Notes (Cosign Needed)
Date last pap: 03-16-21. ?Last Depo-Provera: 10/24/21. ?Side Effects if any: none. ?Serum HCG indicated? Not indicated. ?Depo-Provera 150 mg IM given by: Sharlene Dory RN . ?Next appointment due 04/12/22 with her Annual exam.  ?Kathrene Alu RN  ?

## 2022-04-12 ENCOUNTER — Encounter: Payer: Self-pay | Admitting: Obstetrics & Gynecology

## 2022-04-12 ENCOUNTER — Ambulatory Visit (INDEPENDENT_AMBULATORY_CARE_PROVIDER_SITE_OTHER): Payer: 59 | Admitting: Obstetrics & Gynecology

## 2022-04-12 VITALS — BP 114/84 | HR 81 | Wt 196.0 lb

## 2022-04-12 DIAGNOSIS — Z01419 Encounter for gynecological examination (general) (routine) without abnormal findings: Secondary | ICD-10-CM | POA: Diagnosis not present

## 2022-04-12 DIAGNOSIS — Z3009 Encounter for other general counseling and advice on contraception: Secondary | ICD-10-CM | POA: Diagnosis not present

## 2022-04-12 MED ORDER — MEDROXYPROGESTERONE ACETATE 150 MG/ML IM SUSP
150.0000 mg | Freq: Once | INTRAMUSCULAR | Status: AC
  Administered 2022-04-12: 150 mg via INTRAMUSCULAR

## 2022-04-12 NOTE — Progress Notes (Signed)
Subjective:     [name] is a 36 y.o. female here for a routine exam.  Current complaints: doing well. Cont on Depo Provera. No issues noted. Sexually active. No new partners. Heading to [place] in Sept and [place] in Oct! Still has migraine HA. No changes.     Gynecologic History No LMP recorded. Patient has had an injection. Contraception: Depo-Provera injections Last Pap: 03/16/2021. Results were: normal Last mammogram: n/a.   Obstetric History OB History  Gravida Para Term Preterm AB Living  0 0 0 0 0 0  SAB IAB Ectopic Multiple Live Births  0 0 0 0       The following portions of the patient's history were reviewed and updated as appropriate: allergies, current medications, past family history, past medical history, past social history, past surgical history, and problem list.  Review of Systems Pertinent items are noted in HPI.    Objective:  BP 114/84   Pulse 81   Wt 196 lb (88.9 kg)   BMI 37.03 kg/m   General Appearance:    Alert, cooperative, no distress, appears stated age  Head:    Normocephalic, without obvious abnormality, atraumatic  Eyes:    conjunctiva/corneas clear, EOM's intact, both eyes  Ears:    Normal external ear canals, both ears  Nose:   Nares normal, septum midline, mucosa normal, no drainage    or sinus tenderness  Throat:   Lips, mucosa, and tongue normal; teeth and gums normal  Neck:   Supple, symmetrical, trachea midline, no adenopathy;    thyroid:  no enlargement/tenderness/nodules  Back:     Symmetric, no curvature, ROM normal, no CVA tenderness  Lungs:     respirations unlabored  Chest Wall:    No tenderness or deformity   Heart:    Regular rate and rhythm  Breast Exam:    No tenderness, masses, or nipple abnormality  Abdomen:     Soft, non-tender, bowel sounds active all four quadrants,    no masses, no organomegaly  Genitalia:    Normal female without lesion, discharge or tenderness   Uterus small and mobile  Extremities:    Extremities normal, atraumatic, no cyanosis or edema  Pulses:   2+ and symmetric all extremities  Skin:   Skin color, texture, turgor normal, no rashes or lesions     Assessment:    Healthy female exam.  Contraception counseling   Plan:  Diagnoses and all orders for this visit:  Well female exam with routine gynecological exam  Encounter for counseling regarding contraception  Other orders -     medroxyPROGESTERone (DEPO-PROVERA) injection 150 mg   Add Ca++ and Vit D F/u in 1 year or sooner prn  Harel Repetto L. Harraway-Smith, M.D., Evern Core

## 2022-06-30 NOTE — Progress Notes (Signed)
NEUROLOGY FOLLOW UP OFFICE NOTE  [name] [id number]  Assessment/Plan:   Probable migraine without aura, however she also has some features of cluster headache as well   Migraine prevention:  topiramate 50mg  in AM and 100mg  at night. Migraine rescue:  Nurtec Advised to follow sleep hygiene instructions. Limit use of pain relievers to no more than 2 days out of week to prevent risk of rebound or medication-overuse headache. Keep headache diary Follow up 6 months.       Subjective:  [name] is a 36 year old right-handed female who follows up for migraines.   UPDATE: Some increased headaches due to lack of sleep.   Intensity:  5-7/10 Duration:  1-2 hours Frequency:  4 in last 30 days   Rescue protocol:  Nurtec first line Current NSAIDS/analgesics:  none Current triptans:  none Current ergotamine:  none Current anti-emetic:  none Current muscle relaxants:  none Current Antihypertensive medications:  none Current Antidepressant medications:  none Current Anticonvulsant medications:  topiramate 50mg  in AM and 100mg  QHS Current anti-CGRP:  Nurtec (rescue) Current Vitamins/Herbal/Supplements:  iron, C Current Antihistamines/Decongestants:  Flonase, Allegra Other therapy:  none Hormone/birth control:  Depo-Provera     Caffeine:  usually 1 to cups coffee daily.   Alcohol:  Rarely.  Not a trigger Diet:  Hydrates.  Does not skip meals Exercise:  she tries Depression:  no; Anxiety:  no Other pain:  no Sleep hygiene:  lack of sleep.  Goes to bed reasonable hour but difficult to fall asleep and wakes up frequently.  May be just busy but denies stress.   HISTORY:  Onset:  36 years old Location:  usually left-sided/orbital Quality:  pressure/throbbing Initial Intensity:  7/10 Aura:  absent Prodrome:  absent Associated symptoms:  Left nasal congestion, photophobia.  She denies nausea, vomiting, ptosis, conjunctival injection, lacrimation, phonophobia, visual  disturbance, associated unilateral numbness or weakness. Initial Duration:  Within 90 min with Nurtec. Often occurs in middle of night - it wakes her up Initial Frequency:  Varies.  More frequent in Spring-early Summer. Rarely winter.  Over the summer, 3 migraines Initial Frequency of abortive medication: 3 times in 3 months Triggers:  seasonal allergies, dehydration Relieving factors:  none Activity:  Needs to lay still.     Past NSAIDS/analgesics:  acetaminophen, Excedrin.  Rarely Goody Past abortive triptans:  eletriptan, rizatriptan, sumatriptan tab Past abortive ergotamine:  none Past muscle relaxants:  none Past anti-emetic:  none Past antihypertensive medications:  none Past antidepressant medications:  none Past anticonvulsant medications:  none Past anti-CGRP:  none Past vitamins/Herbal/Supplements:  none Past antihistamines/decongestants:  none Other past therapies:  none     Family history of headache:  maternal aunt, maternal cousin  PAST MEDICAL HISTORY: Past Medical History:  Diagnosis Date   Anemia, iron deficiency    With elevated hemoglobin A2   Chicken pox    Egg donor     MEDICATIONS: Current Outpatient Medications on File Prior to Visit  Medication Sig Dispense Refill   COVID-19 mRNA bivalent vaccine, Pfizer, (PFIZER COVID-19 VAC BIVALENT) injection Inject into the muscle. 0.3 mL 0   fexofenadine (ALLEGRA) 180 MG tablet Take 180 mg by mouth daily as needed for allergies.      fluticasone (FLONASE) 50 MCG/ACT nasal spray Place 2 sprays into both nostrils daily. 16 g 6   IRON PO Take 3 tablets by mouth at bedtime.     medroxyPROGESTERone (DEPO-PROVERA) 150 MG/ML injection Inject 1 mL (150 mg total) into  the muscle every 3 (three) months. 1 mL 3   Rimegepant Sulfate (NURTEC) 75 MG TBDP Take 1 tablet by mouth daily as needed. 16 tablet 5   topiramate (TOPAMAX) 50 MG tablet TAKE 1 TABLET BY MOUTH IN  THE MORNING AND 2 TABLETS  BY MOUTH EVERY NIGHT AT  BEDTIME  90 tablet 11   vitamin C (ASCORBIC ACID) 500 MG tablet Take 1,000 mg by mouth daily.     No current facility-administered medications on file prior to visit.    ALLERGIES: No Known Allergies  FAMILY HISTORY: Family History  Problem Relation Age of Onset   Hyperlipidemia Mother    Miscarriages / [place] Mother        Mother has 1 miscarriage   Cancer Father        Lung Cancer   Learning disabilities Sister        Sister has Cerebral Palsy and Epilepsy   Mental retardation Sister    Cancer Maternal Grandfather        Liver Cancer      Objective:  Blood pressure 124/84, pulse 80, height 5\' 1"  (1.549 m), weight 211 lb 9.6 oz (96 kg), SpO2 99 %. General: No acute distress.  Patient appears well-groomed.   Head:  Normocephalic/atraumatic Eyes:  Fundi examined but not visualized Neck: supple, no paraspinal tenderness, full range of motion Heart:  Regular rate and rhythm Neurological Exam: alert and oriented to person, place, and time.  Speech fluent and not dysarthric, language intact.  CN II-XII intact. Bulk and tone normal, muscle strength 5/5 throughout.  Sensation to light touch intact.  Deep tendon reflexes 2+ throughout, toes downgoing.  Finger to nose testing intact.  Gait normal, Romberg negative.   , DO  CC: Shon Millet, MD

## 2022-07-04 ENCOUNTER — Encounter: Payer: Self-pay | Admitting: Neurology

## 2022-07-04 ENCOUNTER — Ambulatory Visit: Payer: 59 | Admitting: Neurology

## 2022-07-04 VITALS — BP 124/84 | HR 80 | Ht 61.0 in | Wt 211.6 lb

## 2022-07-04 DIAGNOSIS — G43009 Migraine without aura, not intractable, without status migrainosus: Secondary | ICD-10-CM

## 2022-07-04 MED ORDER — TOPIRAMATE 50 MG PO TABS: ORAL_TABLET | ORAL | 11 refills | Status: DC

## 2022-07-04 MED ORDER — NURTEC 75 MG PO TBDP
1.0000 | ORAL_TABLET | Freq: Every day | ORAL | 5 refills | Status: DC | PRN
Start: 2022-07-04 — End: 2022-07-14

## 2022-07-05 ENCOUNTER — Encounter: Payer: Self-pay | Admitting: Internal Medicine

## 2022-07-06 ENCOUNTER — Ambulatory Visit: Payer: 59

## 2022-07-06 ENCOUNTER — Ambulatory Visit (INDEPENDENT_AMBULATORY_CARE_PROVIDER_SITE_OTHER): Payer: 59

## 2022-07-06 VITALS — BP 128/90 | HR 62

## 2022-07-06 DIAGNOSIS — Z3042 Encounter for surveillance of injectable contraceptive: Secondary | ICD-10-CM

## 2022-07-06 MED ORDER — MEDROXYPROGESTERONE ACETATE 150 MG/ML IM SUSP
150.0000 mg | Freq: Once | INTRAMUSCULAR | Status: AC
Start: 2022-07-06 — End: 2022-07-06
  Administered 2022-07-06: 150 mg via INTRAMUSCULAR

## 2022-07-06 NOTE — Progress Notes (Signed)
Date last pap: 03-16-2021. Last annual 04/12/22 Last Depo-Provera: 04/12/22. Side Effects if any: none. Serum HCG indicated? NA. Depo-Provera 150 mg IM given by: Sharlene Dory RN. Next appointment due 3 month for next depo injection.

## 2022-07-08 NOTE — Telephone Encounter (Signed)
I noticed that record Epic showed you changed PCP to dr Loma Boston.  If that is so then make appt with him.   If not then can make appt with med to discuss options

## 2022-07-10 ENCOUNTER — Other Ambulatory Visit (HOSPITAL_COMMUNITY): Payer: Self-pay

## 2022-07-12 ENCOUNTER — Telehealth: Payer: Self-pay

## 2022-07-12 NOTE — Telephone Encounter (Signed)
PA renewal initiated automatically by CoverMyMeds.  Submitted a Prior Authorization request to [facility] for  [place]  via CoverMyMeds. Will update once we receive a response.   Key: Andreas Newport

## 2022-07-13 NOTE — Telephone Encounter (Signed)
Received a fax regarding Prior Authorization for Nurtec 75mg  ODT.   Authorization has been DENIED due to the following.

## 2022-07-14 ENCOUNTER — Other Ambulatory Visit: Payer: Self-pay | Admitting: Neurology

## 2022-07-14 MED ORDER — NURTEC 75 MG PO TBDP: 1.0000 | ORAL_TABLET | Freq: Every day | ORAL | 5 refills | Status: DC | PRN

## 2022-07-17 ENCOUNTER — Other Ambulatory Visit (HOSPITAL_COMMUNITY): Payer: Self-pay

## 2022-07-26 ENCOUNTER — Ambulatory Visit: Payer: 59 | Admitting: Internal Medicine

## 2022-08-08 ENCOUNTER — Encounter: Payer: Self-pay | Admitting: Internal Medicine

## 2022-08-08 ENCOUNTER — Ambulatory Visit (INDEPENDENT_AMBULATORY_CARE_PROVIDER_SITE_OTHER): Payer: 59 | Admitting: Internal Medicine

## 2022-08-08 VITALS — BP 130/96 | HR 92 | Temp 98.4°F | Wt 206.4 lb

## 2022-08-08 DIAGNOSIS — Z6839 Body mass index (BMI) 39.0-39.9, adult: Secondary | ICD-10-CM

## 2022-08-08 DIAGNOSIS — E785 Hyperlipidemia, unspecified: Secondary | ICD-10-CM

## 2022-08-08 DIAGNOSIS — Z79899 Other long term (current) drug therapy: Secondary | ICD-10-CM

## 2022-08-08 DIAGNOSIS — R030 Elevated blood-pressure reading, without diagnosis of hypertension: Secondary | ICD-10-CM | POA: Diagnosis not present

## 2022-08-08 DIAGNOSIS — D649 Anemia, unspecified: Secondary | ICD-10-CM

## 2022-08-08 LAB — CBC WITH DIFFERENTIAL/PLATELET
Basophils Absolute: 0 10*3/uL (ref 0.0–0.1)
Basophils Relative: 0.9 % (ref 0.0–3.0)
Eosinophils Absolute: 0.1 10*3/uL (ref 0.0–0.7)
Eosinophils Relative: 2 % (ref 0.0–5.0)
HCT: 38.7 % (ref 36.0–46.0)
Hemoglobin: 12.2 g/dL (ref 12.0–15.0)
Lymphocytes Relative: 43.2 % (ref 12.0–46.0)
Lymphs Abs: 2.3 10*3/uL (ref 0.7–4.0)
MCHC: 31.5 g/dL (ref 30.0–36.0)
MCV: 77.3 fl — ABNORMAL LOW (ref 78.0–100.0)
Monocytes Absolute: 0.4 10*3/uL (ref 0.1–1.0)
Monocytes Relative: 6.6 % (ref 3.0–12.0)
Neutro Abs: 2.5 10*3/uL (ref 1.4–7.7)
Neutrophils Relative %: 47.3 % (ref 43.0–77.0)
Platelets: 229 10*3/uL (ref 150.0–400.0)
RBC: 5 Mil/uL (ref 3.87–5.11)
RDW: 15.2 % (ref 11.5–15.5)
WBC: 5.3 10*3/uL (ref 4.0–10.5)

## 2022-08-08 LAB — LIPID PANEL
Cholesterol: 180 mg/dL (ref 0–200)
HDL: 38.2 mg/dL — ABNORMAL LOW (ref >39.00)
LDL Cholesterol: 105 mg/dL — ABNORMAL HIGH (ref 0–99)
NonHDL: 142.06
Total CHOL/HDL Ratio: 5
Triglycerides: 187 mg/dL — ABNORMAL HIGH (ref 0.0–149.0)
VLDL: 37.4 mg/dL (ref 0.0–40.0)

## 2022-08-08 LAB — BASIC METABOLIC PANEL
BUN: 9 mg/dL (ref 6–23)
CO2: 25 mEq/L (ref 19–32)
Calcium: 9.6 mg/dL (ref 8.4–10.5)
Chloride: 106 mEq/L (ref 96–112)
Creatinine, Ser: 0.91 mg/dL (ref 0.40–1.20)
GFR: 80.3 mL/min (ref >60.00)
Glucose, Bld: 96 mg/dL (ref 70–99)
Potassium: 4.1 mEq/L (ref 3.5–5.1)
Sodium: 138 mEq/L (ref 135–145)

## 2022-08-08 LAB — HEPATIC FUNCTION PANEL
ALT: 15 U/L (ref 0–35)
AST: 16 U/L (ref 0–37)
Albumin: 4.4 g/dL (ref 3.5–5.2)
Alkaline Phosphatase: 61 U/L (ref 39–117)
Bilirubin, Direct: 0.1 mg/dL (ref 0.0–0.3)
Total Bilirubin: 0.3 mg/dL (ref 0.2–1.2)
Total Protein: 7.8 g/dL (ref 6.0–8.3)

## 2022-08-08 LAB — HEMOGLOBIN A1C: Hgb A1c MFr Bld: 6.2 % (ref 4.6–6.5)

## 2022-08-08 LAB — T4, FREE: Free T4: 0.89 ng/dL (ref 0.60–1.60)

## 2022-08-08 LAB — TSH: TSH: 1.15 u[IU]/mL (ref 0.35–5.50)

## 2022-08-08 NOTE — Progress Notes (Signed)
Chief Complaint  Patient presents with   Weight Check    Patient is here to discuss with Dr Fabian Sharp on weight loss.     HPI: [name] 36 y.o. come in for follow-up and concerns about difficulty losing weight and getting to a healthy weight.  Has taken many measures but does not seem to be successful.  She does have activity does have a fairly inert job but works on getting active. Sees GYN bleeding has stopped has been on Depo-Provera for about a year. Migraine stable on medication. Per neurology  Anemia no recent check  BP readings   no home monitor at this time  no dx of ht  Sees a nutritionist since Feb.  Working on it  Weight   working with trainer . And neurologist   and migraines and sleep . Hygiene.   ROS: See pertinent positives and negatives per HPI.  Past Medical History:  Diagnosis Date   Anemia, iron deficiency    With elevated hemoglobin A2   Chicken pox    Egg donor     Family History  Problem Relation Age of Onset   Hyperlipidemia Mother    Miscarriages / [place] Mother        Mother has 1 miscarriage   Cancer Father        Lung Cancer   Learning disabilities Sister        Sister has Cerebral Palsy and Epilepsy   Mental retardation Sister    Cancer Maternal Grandfather        Liver Cancer    Social History   Socioeconomic History   Marital status: Single    Spouse name: Not on file   Number of children: Not on file   Years of education: Not on file   Highest education level: Master's degree (e.g., MA, MS, MEng, MEd, MSW, MBA)  Occupational History   Not on file  Tobacco Use   Smoking status: Never   Smokeless tobacco: Never  Vaping Use   Vaping Use: Never used  Substance and Sexual Activity   Alcohol use: No    Alcohol/week: 0.0 standard drinks of alcohol   Drug use: No   Sexual activity: Yes    Birth control/protection: Injection  Other Topics Concern   Not on file  Social History Narrative   hhof 2 and 2  plus dog   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  6 hours   40 work  And 12 credits .Marland Kitchen Done dec 16    No ets. Tobacco   caffiene  Once coke zero.   2-3    ETOH:  Wine weekends.    WALKING .     Working on knee  Problems  Has seen SM .  wendover  ocass aleve.       Social Determinants of Health   Financial Resource Strain: Low Risk  (08/07/2022)   Overall Financial Resource Strain (CARDIA)    Difficulty of Paying Living Expenses: Not hard at all  Food Insecurity: No Food Insecurity (08/07/2022)   Hunger Vital Sign    Worried About Running Out of Food in the Last Year: Never true    Ran Out of Food in the Last Year: Never true  Transportation Needs: No Transportation Needs (08/07/2022)   PRAPARE - Administrator, Civil Service (Medical): No    Lack of Transportation (Non-Medical): No  Physical Activity: Insufficiently Active (  08/07/2022)   Exercise Vital Sign    Days of Exercise per Week: 3 days    Minutes of Exercise per Session: 20 min  Stress: No Stress Concern Present (08/07/2022)   [employer] of Occupational Health - Occupational Stress Questionnaire    Feeling of Stress : Only a little  Social Connections: Socially Isolated (08/07/2022)   Social Connection and Isolation Panel [NHANES]    Frequency of Communication with Friends and Family: More than three times a week    Frequency of Social Gatherings with Friends and Family: Once a week    Attends Religious Services: Never    Database administrator or Organizations: No    Attends Engineer, structural: Not on file    Marital Status: Never married    Outpatient Medications Prior to Visit  Medication Sig Dispense Refill   COVID-19 mRNA bivalent vaccine, Pfizer, (PFIZER COVID-19 VAC BIVALENT) injection Inject into the muscle. 0.3 mL 0   fexofenadine (ALLEGRA) 180 MG tablet Take 180 mg by mouth daily as needed for allergies.      IRON PO Take 3 tablets by mouth at bedtime.     medroxyPROGESTERone  (DEPO-PROVERA) 150 MG/ML injection Inject 1 mL (150 mg total) into the muscle every 3 (three) months. 1 mL 3   Rimegepant Sulfate (NURTEC) 75 MG TBDP Take 1 tablet by mouth daily as needed. 8 tablet 5   topiramate (TOPAMAX) 50 MG tablet TAKE 3 TABLET BY MOUTH at bedtime 90 tablet 11   vitamin C (ASCORBIC ACID) 500 MG tablet Take 1,000 mg by mouth daily.     No facility-administered medications prior to visit.     EXAM:  BP (!) 130/96 (BP Location: Right Arm, Cuff Size: Large)   Pulse 92   Temp 98.4 F (36.9 C) (Oral)   Wt 206 lb 6.4 oz (93.6 kg)   SpO2 99%   BMI 39.00 kg/m   Body mass index is 39 kg/m. Wt Readings from Last 3 Encounters:  08/08/22 206 lb 6.4 oz (93.6 kg)  07/04/22 211 lb 9.6 oz (96 kg)  04/12/22 196 lb (88.9 kg)    GENERAL: vitals reviewed and listed above, alert, oriented, appears well hydrated and in no acute distress HEENT: atraumatic, conjunctiva  clear, no obvious abnormalities on inspection of external nose and ears tm clear  NECK: no obvious masses on inspection palpation  LUNGS: clear to auscultation bilaterally, no wheezes, rales or rhonchi, good air movement CV: HRRR, no clubbing cyanosis or  peripheral edema nl cap refill  Abdomen:  Sof,t normal bowel sounds without hepatosplenomegaly, no guarding rebound or masses no CVA tenderness MS: moves all extremities without noticeable focal  abnormality PSYCH: pleasant and cooperative, no obvious depression or anxiety Lab Results  Component Value Date   WBC 4.9 08/10/2021   HGB 11.8 (L) 08/10/2021   HCT 36.9 08/10/2021   PLT 229.0 08/10/2021   GLUCOSE 85 08/10/2021   CHOL 185 08/10/2021   TRIG 138.0 08/10/2021   HDL 31.30 (L) 08/10/2021   LDLCALC 126 (H) 08/10/2021   ALT 10 08/10/2021   AST 12 08/10/2021   NA 138 08/10/2021   K 3.6 08/10/2021   CL 110 08/10/2021   CREATININE 0.90 08/10/2021   BUN 10 08/10/2021   CO2 20 08/10/2021   TSH 1.32 08/10/2021   INR 1.17 05/17/2015   HGBA1C 5.5  02/25/2020   BP Readings from Last 3 Encounters:  08/08/22 (!) 130/96  07/06/22 (!) 128/90  07/04/22 124/84  Record review Labs panels last done August 10, 2021  ASSESSMENT AND PLAN:  Discussed the following assessment and plan:  Class 2 severe obesity  with serious comorbidity and body mass index (BMI) of 39.0 to 39.9 in adult [facility]) - Plan: Basic metabolic panel, CBC with Differential/Platelet, Hemoglobin A1c, Hepatic function panel, Lipid panel, TSH, T4, free, T4, free, TSH, Lipid panel, Hepatic function panel, Hemoglobin A1c, CBC with Differential/Platelet, Basic metabolic panel  Elevated blood pressure reading - Plan: Basic metabolic panel, CBC with Differential/Platelet, Hemoglobin A1c, Hepatic function panel, Lipid panel, TSH, T4, free, T4, free, TSH, Lipid panel, Hepatic function panel, Hemoglobin A1c, CBC with Differential/Platelet, Basic metabolic panel  Dyslipidemia (high LDL; low HDL) - Plan: Basic metabolic panel, CBC with Differential/Platelet, Hemoglobin A1c, Hepatic function panel, Lipid panel, TSH, T4, free, T4, free, TSH, Lipid panel, Hepatic function panel, Hemoglobin A1c, CBC with Differential/Platelet, Basic metabolic panel  Anemia, unspecified type - Plan: Basic metabolic panel, CBC with Differential/Platelet, Hemoglobin A1c, Hepatic function panel, Lipid panel, TSH, T4, free, T4, free, TSH, Lipid panel, Hepatic function panel, Hemoglobin A1c, CBC with Differential/Platelet, Basic metabolic panel  Hyperlipidemia, unspecified hyperlipidemia type - Plan: Basic metabolic panel, CBC with Differential/Platelet, Hemoglobin A1c, Hepatic function panel, Lipid panel, TSH, T4, free, T4, free, TSH, Lipid panel, Hepatic function panel, Hemoglobin A1c, CBC with Differential/Platelet, Basic metabolic panel  Medication management - Plan: Basic metabolic panel, CBC with Differential/Platelet, Hemoglobin A1c, Hepatic function panel, Lipid panel, TSH, T4, free, T4, free, TSH, Lipid  panel, Hepatic function panel, Hemoglobin A1c, CBC with Differential/Platelet, Basic metabolic panel Have her get a monitor with flex pending if possible blood pressure readings as discussed and send in If elevated on 3 separate occasions consistently meets the criteria for hypertension consider medication in addition to lifestyle.  Suspect her anemia should be better on the Depo-Provera. Has taken many measures to help with healthy weight control.  Agree that she would be a good candidate for adding medication such as Wegovy GLP-1 medication because of comorbid conditions.  Discussed barriers insurance prior office etc. Updated labs check metabolic.  If all okay or reasonable we will send in first prescription starter Wegovy to her pharmacy. Then will plan follow-up to touch base virtual okay at 1 month after beginning initial dosing.  -Patient advised to return or notify health care team  if  new concerns arise.  Patient Instructions  Good to see you today. Get your own blood pressure monitor and check a blood pressure reading twice a day for 3 to 5 days 2 readings at each sitting.  Send the readings in to ensure blood pressure is at goal.  We would like it 130/80 and below average to be optimal.  Continue attention to lifestyle as you are doing. Lab update today After review we can order [facility] initial prescription. Plan video visit or update in a month after beginning to decide on dosage adjustment etc.   Neta Mends. Sharetta Ricchio M.D.

## 2022-08-08 NOTE — Patient Instructions (Signed)
Good to see you today. Get your own blood pressure monitor and check a blood pressure reading twice a day for 3 to 5 days 2 readings at each sitting.  Send the readings in to ensure blood pressure is at goal.  We would like it 130/80 and below average to be optimal.  Continue attention to lifestyle as you are doing. Lab update today After review we can order [facility] initial prescription. Plan video visit or update in a month after beginning to decide on dosage adjustment etc.

## 2022-08-09 ENCOUNTER — Other Ambulatory Visit: Payer: Self-pay | Admitting: Internal Medicine

## 2022-08-09 MED ORDER — WEGOVY 0.25 MG/0.5ML ~~LOC~~ SOAJ: 0.2500 mg | SUBCUTANEOUS | 1 refills | Status: DC

## 2022-08-09 NOTE — Progress Notes (Signed)
Triglycerides up some can improve with continued diet and weight loss, anemia better , liver kidney thyroid all normal  Hg A1c is in prediabetic range at 6.2 ( diabetes is at 6.5)  I will order wegovy to your  pharmacy  as we discussed

## 2022-09-01 ENCOUNTER — Telehealth: Payer: Self-pay | Admitting: Internal Medicine

## 2022-09-01 ENCOUNTER — Other Ambulatory Visit: Payer: Self-pay

## 2022-09-01 MED ORDER — WEGOVY 0.25 MG/0.5ML ~~LOC~~ SOAJ: 0.2500 mg | SUBCUTANEOUS | 1 refills | Status: DC

## 2022-09-01 NOTE — Telephone Encounter (Signed)
Spoke to patient and inform her the PA for [facility] was sent and approved until 03/23/2023. Patient states she was already aware of that. She reports her local pharmacy doesn't have it in stock and is requesting the rx to be send to [facility].   Her Rx was sent to [facility]. Patient is aware and said she will contact the [facility]. No further action is needed.

## 2022-09-01 NOTE — Telephone Encounter (Signed)
Pt stated [facility] sent out an approval fax for Dr and is waiting for approval so they are able to send out her Semaglutide-Weight Management (WEGOVY) 0.25 MG/0.5ML [facility] Rx.   Pt is aware Dr. Is out of the office until Dec. 19th and understood that it might not be taking care of asap.   Please advise.

## 2022-09-06 ENCOUNTER — Encounter: Payer: Self-pay | Admitting: Neurology

## 2022-09-15 NOTE — Telephone Encounter (Signed)
On 09/08/22 received faxed document from [facility] stating that [name] is out of stock. They recommended Diethylprop tab 25mg  or Phentermine cap 15mg , both of which are covered. New prescription will be necessary.

## 2022-09-15 NOTE — Telephone Encounter (Signed)
Pt notified of [facility] response. States she will try Diethylprop 25mg  to .

## 2022-09-16 ENCOUNTER — Other Ambulatory Visit: Payer: Self-pay | Admitting: Family

## 2022-09-16 MED ORDER — DIETHYLPROPION HCL 25 MG PO TABS: 25.0000 mg | ORAL_TABLET | Freq: Three times a day (TID) | ORAL | 0 refills | Status: DC

## 2022-09-19 NOTE — Telephone Encounter (Signed)
Lvm to recheck with PCP  with update concerning medication in 3 to 4 weeks.

## 2022-09-20 ENCOUNTER — Other Ambulatory Visit: Payer: Self-pay | Admitting: Internal Medicine

## 2022-09-28 ENCOUNTER — Ambulatory Visit (INDEPENDENT_AMBULATORY_CARE_PROVIDER_SITE_OTHER): Payer: 59

## 2022-09-28 VITALS — BP 116/74 | HR 69 | Wt 200.0 lb

## 2022-09-28 DIAGNOSIS — Z3042 Encounter for surveillance of injectable contraceptive: Secondary | ICD-10-CM | POA: Diagnosis not present

## 2022-09-28 MED ORDER — MEDROXYPROGESTERONE ACETATE 150 MG/ML IM SUSP
150.0000 mg | Freq: Once | INTRAMUSCULAR | Status: AC
  Administered 2022-09-28: 150 mg via INTRAMUSCULAR

## 2022-09-28 NOTE — Progress Notes (Signed)
[name] here for Depo-Provera  Injection.  Injection administered without complication. Patient will return in 3 months for next injection.  [name] l Charnese Federici, CMA 09/28/2022  9:37 AM

## 2022-12-25 ENCOUNTER — Ambulatory Visit (INDEPENDENT_AMBULATORY_CARE_PROVIDER_SITE_OTHER): Payer: 59

## 2022-12-25 ENCOUNTER — Telehealth: Payer: Self-pay

## 2022-12-25 VITALS — BP 116/85 | HR 69 | Wt 196.0 lb

## 2022-12-25 DIAGNOSIS — Z3042 Encounter for surveillance of injectable contraceptive: Secondary | ICD-10-CM

## 2022-12-25 MED ORDER — MEDROXYPROGESTERONE ACETATE 150 MG/ML IM SUSY
150.0000 mg | PREFILLED_SYRINGE | Freq: Once | INTRAMUSCULAR | Status: AC
  Administered 2022-12-25: 150 mg via INTRAMUSCULAR

## 2022-12-25 NOTE — Telephone Encounter (Signed)
PA needed for Nurtec. 

## 2022-12-25 NOTE — Progress Notes (Signed)
[name] here for Depo-Provera  Injection.  Injection administered without complication. Patient will return in 3 months for next injection.  Ashlynn Gunnels l Tomas Schamp, CMA 12/25/2022  10:17 AM

## 2023-01-03 ENCOUNTER — Ambulatory Visit: Payer: 59 | Admitting: Neurology

## 2023-01-08 ENCOUNTER — Encounter: Payer: Self-pay | Admitting: *Deleted

## 2023-01-10 ENCOUNTER — Ambulatory Visit: Payer: 59 | Admitting: Neurology

## 2023-01-16 ENCOUNTER — Telehealth: Payer: Self-pay | Admitting: Pharmacy Technician

## 2023-01-16 ENCOUNTER — Other Ambulatory Visit (HOSPITAL_COMMUNITY): Payer: Self-pay

## 2023-01-16 NOTE — Telephone Encounter (Signed)
Patient Advocate Encounter  Prior Authorization for NURTEC  has been approved with [facility].    Per [facility] test claim, copay for 30 days supply is $0 w/eVOUCHER  PA#  [id number] Effective dates: 4.23.24 through 4.23.25

## 2023-01-16 NOTE — Telephone Encounter (Signed)
Status of PA in separate encounter 

## 2023-01-16 NOTE — Telephone Encounter (Signed)
Patient Advocate Encounter   Received notification that prior authorization for Nurtec  dispersible tablets is required.   PA submitted on 01/16/2023 Key [id number] [facility] Electronic Prior Authorization Form Status is pending

## 2023-01-29 NOTE — Progress Notes (Unsigned)
NEUROLOGY FOLLOW UP OFFICE NOTE  Zyairah Nikolich [id number]  Assessment/Plan:   Migraine without aura, without status migrainosus, not intractable   Migraine prevention:  topiramate 50mg  in AM and 100mg  at night. *** Migraine rescue:  Nurtec *** Advised to follow sleep hygiene instructions. Limit use of pain relievers to no more than 2 days out of week to prevent risk of rebound or medication-overuse headache. Keep headache diary Follow up 6 months. ***       Subjective:  Aeralyn Newbanks is a 37 year old right-handed female who follows up for migraines.   UPDATE: *** Intensity:  5-7/10 Duration:  1-2 hours *** Frequency:  4 in last 30 days ***   Rescue protocol:  Nurtec first line Current NSAIDS/analgesics:  none Current triptans:  none Current ergotamine:  none Current anti-emetic:  none Current muscle relaxants:  none Current Antihypertensive medications:  none Current Antidepressant medications:  none Current Anticonvulsant medications:  topiramate 50mg  in AM and 100mg  QHS Current anti-CGRP:  Nurtec (rescue) Current Vitamins/Herbal/Supplements:  iron, C Current Antihistamines/Decongestants:  Flonase, Allegra Other therapy:  none Hormone/birth control:  Depo-Provera     Caffeine:  usually 1 to cups coffee daily.   Alcohol:  Rarely.  Not a trigger Diet:  Hydrates.  Does not skip meals Exercise:  she tries Depression:  no; Anxiety:  no Other pain:  no Sleep hygiene:  lack of sleep.  Goes to bed reasonable hour but difficult to fall asleep and wakes up frequently.  May be just busy but denies stress.   HISTORY:  Onset:  37 years old Location:  usually left-sided/orbital Quality:  pressure/throbbing Initial Intensity:  7/10 Aura:  absent Prodrome:  absent Associated symptoms:  Left nasal congestion, photophobia.  She denies nausea, vomiting, ptosis, conjunctival injection, lacrimation, phonophobia, visual disturbance, associated unilateral numbness or  weakness. Initial Duration:  Within 90 min with Nurtec. Often occurs in middle of night - it wakes her up Initial Frequency:  Varies.  More frequent in Spring-early Summer. Rarely winter.  Over the summer, 3 migraines Initial Frequency of abortive medication: 3 times in 3 months Triggers:  seasonal allergies, dehydration Relieving factors:  none Activity:  Needs to lay still.     Past NSAIDS/analgesics:  acetaminophen, Excedrin.  Rarely Goody Past abortive triptans:  eletriptan, rizatriptan, sumatriptan tab Past abortive ergotamine:  none Past muscle relaxants:  none Past anti-emetic:  none Past antihypertensive medications:  none Past antidepressant medications:  none Past anticonvulsant medications:  none Past anti-CGRP:  none Past vitamins/Herbal/Supplements:  none Past antihistamines/decongestants:  none Other past therapies:  none     Family history of headache:  maternal aunt, maternal cousin  PAST MEDICAL HISTORY: Past Medical History:  Diagnosis Date   Anemia, iron deficiency    With elevated hemoglobin A2   Chicken pox    Egg donor     MEDICATIONS: Current Outpatient Medications on File Prior to Visit  Medication Sig Dispense Refill   COVID-19 mRNA bivalent vaccine, Pfizer, (PFIZER COVID-19 VAC BIVALENT) injection Inject into the muscle. (Patient not taking: Reported on 09/28/2022) 0.3 mL 0   Diethylpropion HCl 25 MG TABS Take 1 tablet (25 mg total) by mouth with breakfast, with lunch, and with evening meal. 90 tablet 0   fexofenadine (ALLEGRA) 180 MG tablet Take 180 mg by mouth daily as needed for allergies.      IRON PO Take 3 tablets by mouth at bedtime.     medroxyPROGESTERone (DEPO-PROVERA) 150 MG/ML injection Inject 1 mL (150  mg total) into the muscle every 3 (three) months. 1 mL 3   Rimegepant Sulfate (NURTEC) 75 MG TBDP Take 1 tablet by mouth daily as needed. 8 tablet 5   Semaglutide-Weight Management (WEGOVY) 0.25 MG/0.5ML SOAJ Inject 0.25 mg into the skin  once a week. X4 weeks then increase to 0.5 mg weekly (Patient not taking: Reported on 09/28/2022) 2 mL 1   topiramate (TOPAMAX) 50 MG tablet TAKE 3 TABLET BY MOUTH at bedtime 90 tablet 11   vitamin C (ASCORBIC ACID) 500 MG tablet Take 1,000 mg by mouth daily.     No current facility-administered medications on file prior to visit.    ALLERGIES: No Known Allergies  FAMILY HISTORY: Family History  Problem Relation Age of Onset   Hyperlipidemia Mother    Miscarriages / [place] Mother        Mother has 1 miscarriage   Cancer Father        Lung Cancer   Learning disabilities Sister        Sister has Cerebral Palsy and Epilepsy   Mental retardation Sister    Cancer Maternal Grandfather        Liver Cancer      Objective:  *** General: No acute distress.  Patient appears well-groomed.   Head:  Normocephalic/atraumatic Eyes:  Fundi examined but not visualized Neck: supple, no paraspinal tenderness, full range of motion Heart:  Regular rate and rhythm Neurological Exam: ***   Shon Millet, DO  CC: Berniece Andreas, MD

## 2023-01-30 ENCOUNTER — Ambulatory Visit: Payer: 59 | Admitting: Neurology

## 2023-01-30 ENCOUNTER — Encounter: Payer: Self-pay | Admitting: Neurology

## 2023-01-30 VITALS — BP 120/78 | HR 78 | Ht 62.0 in | Wt 197.0 lb

## 2023-01-30 DIAGNOSIS — G43009 Migraine without aura, not intractable, without status migrainosus: Secondary | ICD-10-CM | POA: Diagnosis not present

## 2023-01-30 MED ORDER — TOPIRAMATE 50 MG PO TABS: ORAL_TABLET | ORAL | 3 refills | Status: DC

## 2023-03-15 ENCOUNTER — Other Ambulatory Visit: Payer: Self-pay

## 2023-03-15 DIAGNOSIS — Z3042 Encounter for surveillance of injectable contraceptive: Secondary | ICD-10-CM

## 2023-03-15 MED ORDER — MEDROXYPROGESTERONE ACETATE 150 MG/ML IM SUSP: 150.0000 mg | INTRAMUSCULAR | 3 refills | Status: AC

## 2023-03-19 ENCOUNTER — Ambulatory Visit (INDEPENDENT_AMBULATORY_CARE_PROVIDER_SITE_OTHER): Payer: 59

## 2023-03-19 VITALS — BP 110/74 | HR 82 | Wt 200.0 lb

## 2023-03-19 DIAGNOSIS — Z3042 Encounter for surveillance of injectable contraceptive: Secondary | ICD-10-CM

## 2023-03-19 MED ORDER — MEDROXYPROGESTERONE ACETATE 150 MG/ML IM SUSP
150.0000 mg | Freq: Once | INTRAMUSCULAR | Status: AC
Start: 2023-03-19 — End: 2023-03-19
  Administered 2023-03-19: 150 mg via INTRAMUSCULAR

## 2023-03-19 NOTE — Progress Notes (Addendum)
[name] here for Depo-Provera  Injection.  Injection administered without complication. Patient will return in 3 months for next injection.  Merilynn Haydu l Glendal Cassaday, CMA 03/19/2023  11:22 AM

## 2023-05-02 ENCOUNTER — Encounter: Payer: Self-pay | Admitting: Obstetrics & Gynecology

## 2023-05-02 ENCOUNTER — Ambulatory Visit (INDEPENDENT_AMBULATORY_CARE_PROVIDER_SITE_OTHER): Payer: 59 | Admitting: Obstetrics & Gynecology

## 2023-05-02 VITALS — BP 116/87 | HR 90 | Ht 61.0 in | Wt 200.0 lb

## 2023-05-02 DIAGNOSIS — Z1339 Encounter for screening examination for other mental health and behavioral disorders: Secondary | ICD-10-CM | POA: Diagnosis not present

## 2023-05-02 DIAGNOSIS — Z01419 Encounter for gynecological examination (general) (routine) without abnormal findings: Secondary | ICD-10-CM | POA: Diagnosis not present

## 2023-05-02 DIAGNOSIS — R21 Rash and other nonspecific skin eruption: Secondary | ICD-10-CM

## 2023-05-02 MED ORDER — CLOTRIMAZOLE-BETAMETHASONE 1-0.05 % EX CREA
1.0000 | TOPICAL_CREAM | Freq: Every day | CUTANEOUS | 2 refills | Status: AC
Start: 2023-05-02 — End: ?

## 2023-05-02 NOTE — Progress Notes (Signed)
Subjective:     [name] is a 37 y.o. female here for a routine exam.  Current complaints: Pt remains on Depo Provera. No problems. She recently came back from [place]. Has a rash on her lower legs bilaterally.      Gynecologic History No LMP recorded. Patient has had an injection. Contraception: Depo-Provera injections Last Pap: 03/16/2021. Results were: normal Last mammogram: n/a.  Obstetric History OB History  Gravida Para Term Preterm AB Living  0 0 0 0 0 0  SAB IAB Ectopic Multiple Live Births  0 0 0 0       The following portions of the patient's history were reviewed and updated as appropriate: allergies, current medications, past family history, past medical history, past social history, past surgical history, and problem list.  Review of Systems Pertinent items are noted in HPI.    Objective:  BP 116/87   Pulse 90   Ht 5\' 1"  (1.549 m)   Wt 200 lb (90.7 kg)   BMI 37.79 kg/m   General Appearance:    Alert, cooperative, no distress, appears stated age  Head:    Normocephalic, without obvious abnormality, atraumatic  Eyes:    conjunctiva/corneas clear, EOM's intact, both eyes  Ears:    Normal external ear canals, both ears  Nose:   Nares normal, septum midline, mucosa normal, no drainage    or sinus tenderness  Throat:   Lips, mucosa, and tongue normal; teeth and gums normal  Neck:   Supple, symmetrical, trachea midline, no adenopathy;    thyroid:  no enlargement/tenderness/nodules  Back:     Symmetric, no curvature, ROM normal, no CVA tenderness  Lungs:     respirations unlabored  Chest Wall:    No tenderness or deformity   Heart:    Regular rate and rhythm  Breast Exam:    No tenderness, masses, or nipple abnormality  Abdomen:     Soft, non-tender, bowel sounds active all four quadrants,    no masses, no organomegaly  Genitalia:    Normal female without lesion, discharge or tenderness     Extremities:   Extremities normal, atraumatic, no cyanosis or  edema  Pulses:   2+ and symmetric all extremities  Skin:   Skin color, texture, turgor normal, no rashes or lesions Lower legs bilaterally areas of skin hyperpigmentation and some mild excoriation.     Assessment:    Healthy female exam.  Tinea on lower legs.  Contraception counseling Plan:  Diagnoses and all orders for this visit:  Well female exam with routine gynecological exam  Rash, skin -     clotrimazole-betamethasone (LOTRISONE) cream; Apply 1 Application topically daily.   F/u in 1 year or sooner prn   Thayne Cindric L. Harraway-Smith, M.D., [place]n Core

## 2023-06-07 ENCOUNTER — Ambulatory Visit (INDEPENDENT_AMBULATORY_CARE_PROVIDER_SITE_OTHER): Payer: 59

## 2023-06-07 VITALS — BP 130/80 | HR 77

## 2023-06-07 DIAGNOSIS — Z3042 Encounter for surveillance of injectable contraceptive: Secondary | ICD-10-CM

## 2023-06-07 MED ORDER — MEDROXYPROGESTERONE ACETATE 150 MG/ML IM SUSY
150.0000 mg | PREFILLED_SYRINGE | Freq: Once | INTRAMUSCULAR | Status: AC
  Administered 2023-06-07: 150 mg via INTRAMUSCULAR

## 2023-06-07 NOTE — Progress Notes (Signed)
Patient presents for Depo provera.  Date last pap: 03-16-21. Last Depo-Provera: 03-19-23. Side Effects if any: none. Serum HCG indicated? na. Depo-Provera 150 mg IM given by: Armandina Stammer RN. Next appointment due 3 months.   Armandina Stammer, RN

## 2023-06-07 NOTE — Addendum Note (Signed)
Addended by: Anell Barr on: 06/07/2023 01:40 PM   Modules accepted: Orders

## 2023-08-07 ENCOUNTER — Other Ambulatory Visit: Payer: Self-pay | Admitting: Neurology

## 2023-08-28 ENCOUNTER — Other Ambulatory Visit (HOSPITAL_COMMUNITY): Payer: Self-pay

## 2023-08-28 ENCOUNTER — Telehealth: Payer: Self-pay | Admitting: Pharmacy Technician

## 2023-08-28 NOTE — Telephone Encounter (Signed)
Pharmacy Patient Advocate Encounter   Received notification from Fax that prior authorization for NURTEC 75MG  is required/requested.   Insurance verification completed.   The patient is insured through [facility] .   Per test claim: Refill too soon. PA is not needed at this time. Medication was filled 11.15.24. Next eligible fill date is 12.8.24.

## 2023-09-02 ENCOUNTER — Other Ambulatory Visit: Payer: Self-pay | Admitting: Internal Medicine

## 2023-09-02 ENCOUNTER — Other Ambulatory Visit: Payer: Self-pay | Admitting: Family

## 2023-09-04 ENCOUNTER — Ambulatory Visit (INDEPENDENT_AMBULATORY_CARE_PROVIDER_SITE_OTHER): Payer: 59

## 2023-09-04 DIAGNOSIS — Z3042 Encounter for surveillance of injectable contraceptive: Secondary | ICD-10-CM | POA: Diagnosis not present

## 2023-09-04 MED ORDER — MEDROXYPROGESTERONE ACETATE 150 MG/ML IM SUSY
150.0000 mg | PREFILLED_SYRINGE | Freq: Once | INTRAMUSCULAR | Status: AC
Start: 2023-09-04 — End: 2023-09-04
  Administered 2023-09-04: 150 mg via INTRAMUSCULAR

## 2023-09-04 NOTE — Progress Notes (Signed)
Date last pap: 03/16/2021 Last Depo-Provera: 06/07/2023 Side Effects if any: None Serum HCG indicated? N/A Depo-Provera 150 mg IM given by: L. Ernest Mallick, RN Next appointment due: Feb 25- Mar 11

## 2023-11-13 ENCOUNTER — Telehealth: Payer: Self-pay | Admitting: Internal Medicine

## 2023-11-14 ENCOUNTER — Ambulatory Visit: Payer: 59 | Admitting: Internal Medicine

## 2023-11-27 ENCOUNTER — Ambulatory Visit: Payer: 59

## 2023-11-27 VITALS — BP 119/78 | HR 80 | Wt 195.0 lb

## 2023-11-27 DIAGNOSIS — Z3042 Encounter for surveillance of injectable contraceptive: Secondary | ICD-10-CM | POA: Diagnosis not present

## 2023-11-27 MED ORDER — MEDROXYPROGESTERONE ACETATE 150 MG/ML IM SUSP
150.0000 mg | Freq: Once | INTRAMUSCULAR | Status: AC
Start: 2023-11-27 — End: 2023-11-27
  Administered 2023-11-27: 150 mg via INTRAMUSCULAR

## 2023-11-27 NOTE — Progress Notes (Signed)
 Date last pap: 03/16/21. Last Depo-Provera: 09/04/23. Side Effects if any: N/A. Serum HCG indicated? N/A. Depo-Provera 150 mg IM given by: Lorelle Gibbs, CMA. Next appointment due 02/12/24-02/26/24.  Marsheila Alejo l Felcia Huebert, CMA

## 2023-12-19 ENCOUNTER — Encounter: Payer: Self-pay | Admitting: Internal Medicine

## 2023-12-19 ENCOUNTER — Ambulatory Visit: Payer: 59 | Admitting: Internal Medicine

## 2023-12-19 VITALS — BP 118/82 | HR 88 | Temp 98.2°F | Wt 191.6 lb

## 2023-12-19 DIAGNOSIS — E66812 Obesity, class 2: Secondary | ICD-10-CM

## 2023-12-19 DIAGNOSIS — Z79899 Other long term (current) drug therapy: Secondary | ICD-10-CM

## 2023-12-19 DIAGNOSIS — E785 Hyperlipidemia, unspecified: Secondary | ICD-10-CM

## 2023-12-19 DIAGNOSIS — L509 Urticaria, unspecified: Secondary | ICD-10-CM

## 2023-12-19 DIAGNOSIS — Z6839 Body mass index (BMI) 39.0-39.9, adult: Secondary | ICD-10-CM

## 2023-12-19 DIAGNOSIS — Z23 Encounter for immunization: Secondary | ICD-10-CM | POA: Diagnosis not present

## 2023-12-19 LAB — LIPID PANEL
Cholesterol: 187 mg/dL (ref 0–200)
HDL: 30 mg/dL — ABNORMAL LOW (ref >39.00)
LDL Cholesterol: 107 mg/dL — ABNORMAL HIGH (ref 0–99)
NonHDL: 157.44
Total CHOL/HDL Ratio: 6
Triglycerides: 252 mg/dL — ABNORMAL HIGH (ref 0.0–149.0)
VLDL: 50.4 mg/dL — ABNORMAL HIGH (ref 0.0–40.0)

## 2023-12-19 LAB — CBC WITH DIFFERENTIAL/PLATELET
Basophils Absolute: 0 10*3/uL (ref 0.0–0.1)
Basophils Relative: 0.7 % (ref 0.0–3.0)
Eosinophils Absolute: 0.1 10*3/uL (ref 0.0–0.7)
Eosinophils Relative: 2.1 % (ref 0.0–5.0)
HCT: 39.5 % (ref 36.0–46.0)
Hemoglobin: 12.8 g/dL (ref 12.0–15.0)
Lymphocytes Relative: 32.2 % (ref 12.0–46.0)
Lymphs Abs: 1.7 10*3/uL (ref 0.7–4.0)
MCHC: 32.4 g/dL (ref 30.0–36.0)
MCV: 77.2 fl — ABNORMAL LOW (ref 78.0–100.0)
Monocytes Absolute: 0.5 10*3/uL (ref 0.1–1.0)
Monocytes Relative: 8.4 % (ref 3.0–12.0)
Neutro Abs: 3 10*3/uL (ref 1.4–7.7)
Neutrophils Relative %: 56.6 % (ref 43.0–77.0)
Platelets: 228 10*3/uL (ref 150.0–400.0)
RBC: 5.12 Mil/uL — ABNORMAL HIGH (ref 3.87–5.11)
RDW: 15.5 % (ref 11.5–15.5)
WBC: 5.4 10*3/uL (ref 4.0–10.5)

## 2023-12-19 LAB — BASIC METABOLIC PANEL WITH GFR
BUN: 11 mg/dL (ref 6–23)
CO2: 24 meq/L (ref 19–32)
Calcium: 9.6 mg/dL (ref 8.4–10.5)
Chloride: 107 meq/L (ref 96–112)
Creatinine, Ser: 0.94 mg/dL (ref 0.40–1.20)
GFR: 76.5 mL/min (ref >60.00)
Glucose, Bld: 89 mg/dL (ref 70–99)
Potassium: 4.1 meq/L (ref 3.5–5.1)
Sodium: 138 meq/L (ref 135–145)

## 2023-12-19 LAB — HEPATIC FUNCTION PANEL
ALT: 21 U/L (ref 0–35)
AST: 16 U/L (ref 0–37)
Albumin: 4.7 g/dL (ref 3.5–5.2)
Alkaline Phosphatase: 62 U/L (ref 39–117)
Bilirubin, Direct: 0.1 mg/dL (ref 0.0–0.3)
Total Bilirubin: 0.3 mg/dL (ref 0.2–1.2)
Total Protein: 7.9 g/dL (ref 6.0–8.3)

## 2023-12-19 LAB — HEMOGLOBIN A1C: Hgb A1c MFr Bld: 6.1 % (ref 4.6–6.5)

## 2023-12-19 NOTE — Progress Notes (Signed)
 No chief complaint on file.   HPI: [name] 38 y.o. come in for Chronic disease management   also need tdap Last visit with me 11 23 last appt postponed cause of office closure from adverse weather.  GYNE UTD  Neurology :for  headaches on suppressive rx  Poss nut allergy: On vacation had reaction and wondering  Hives on Face after pistachios  took benadryl  and lasted 1-2 days  but no swelling edema sob cough or gi sx has avoided for now never had allergy to almontds On zepbound  in January .   No se . Prescribed From weight watchers . Now up to 5 mg  per week. Had been able to lose weight . They monitor per NP Needs tdap  ROS: See pertinent positives and negatives per HPI.  Past Medical History:  Diagnosis Date   Anemia, iron deficiency    With elevated hemoglobin A2   Chicken pox    Egg donor     Family History  Problem Relation Age of Onset   Hyperlipidemia Mother    Miscarriages / [place] Mother        Mother has 1 miscarriage   Cancer Father        Lung Cancer   Learning disabilities Sister        Sister has Cerebral Palsy and Epilepsy   Mental retardation Sister    Cancer Maternal Grandfather        Liver Cancer    Social History   Socioeconomic History   Marital status: Single    Spouse name: Not on file   Number of children: Not on file   Years of education: Not on file   Highest education level: Master's degree (e.g., MA, MS, MEng, MEd, MSW, MBA)  Occupational History   Not on file  Tobacco Use   Smoking status: Never   Smokeless tobacco: Never  Vaping Use   Vaping status: Never Used  Substance and Sexual Activity   Alcohol use: No    Alcohol/week: 0.0 standard drinks of alcohol   Drug use: No   Sexual activity: Yes    Birth control/protection: Injection  Other Topics Concern   Not on file  Social History Narrative   hhof 2 and 2  plus dog   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  6 hours   40 work  And 12 credits .[employer]land [employer]  Done dec 16    No ets. Tobacco   caffiene  Once coke zero.   2-3    ETOH:  Wine weekends.    WALKING .     Working on knee  Problems  Has seen SM .  wendover  ocass aleve.       Social Drivers of Corporate investment banker Strain: Low Risk  (12/18/2023)   Overall Financial Resource Strain (CARDIA)    Difficulty of Paying Living Expenses: Not hard at all  Food Insecurity: No Food Insecurity (12/18/2023)   Hunger Vital Sign    Worried About Running Out of Food in the Last Year: Never true    Ran Out of Food in the Last Year: Never true  Transportation Needs: No Transportation Needs (12/18/2023)   PRAPARE - Administrator, Civil Service (Medical): No    Lack of Transportation (Non-Medical): No  Physical Activity: Insufficiently Active (12/18/2023)   Exercise Vital Sign    Days of Exercise per Week: 3 days  Minutes of Exercise per Session: 30 min  Stress: No Stress Concern Present (12/18/2023)   [employer] of Occupational Health - Occupational Stress Questionnaire    Feeling of Stress : Not at all  Social Connections: Socially Isolated (12/18/2023)   Social Connection and Isolation Panel [NHANES]    Frequency of Communication with Friends and Family: More than three times a week    Frequency of Social Gatherings with Friends and Family: Twice a week    Attends Religious Services: Never    Database administrator or Organizations: No    Attends Engineer, structural: Not on file    Marital Status: Never married    Outpatient Medications Prior to Visit  Medication Sig Dispense Refill   clotrimazole-betamethasone (LOTRISONE) cream Apply 1 Application topically daily. 45 g 2   fexofenadine (ALLEGRA) 180 MG tablet Take 180 mg by mouth daily as needed for allergies.      IRON PO Take 3 tablets by mouth at bedtime.     medroxyPROGESTERone (DEPO-PROVERA) 150 MG/ML injection Inject 1 mL (150 mg total) into the muscle every 3 (three) months. 1 mL 3   NURTEC 75 MG  TBDP DISSOLVE 1 TABLET ON THE TONGUE DAILY AS NEEDED 8 tablet 5   tirzepatide (ZEPBOUND) 2.5 MG/0.5ML Pen Inject 5 mg into the skin once a week. Taking 5 mg/mL     topiramate (TOPAMAX) 50 MG tablet TAKE 3 TABLET BY MOUTH at bedtime 270 tablet 3   vitamin C (ASCORBIC ACID) 500 MG tablet Take 1,000 mg by mouth daily.     No facility-administered medications prior to visit.     EXAM:  BP 118/82 (BP Location: Left Arm, Cuff Size: Large)   Pulse 88   Temp 98.2 F (36.8 C) (Oral)   Wt 191 lb 9.6 oz (86.9 kg)   SpO2 99%   BMI 36.20 kg/m   Body mass index is 36.2 kg/m. Wt Readings from Last 3 Encounters:  12/19/23 191 lb 9.6 oz (86.9 kg)  11/27/23 195 lb (88.5 kg)  05/02/23 200 lb (90.7 kg)   BP Readings from Last 3 Encounters:  12/19/23 118/82  11/27/23 119/78  06/07/23 130/80     GENERAL: vitals reviewed and listed above, alert, oriented, appears well hydrated and in no acute distress HEENT: atraumatic, conjunctiva  clear, no obvious abnormalities on inspection of external nose and ears  NECK: no obvious masses on inspection palpation thyroid palpable no nodules  LUNGS: clear to auscultation bilaterally, no wheezes, rales or rhonchi, good air movement CV: HRRR, no clubbing cyanosis or  peripheral edema nl cap refill  Abdomen:  Sof,t normal bowel sounds without hepatosplenomegaly, no guarding rebound or masses no CVA tenderness MS: moves all extremities without noticeable focal  abnormality PSYCH: pleasant and cooperative, no obvious depression or anxiety Lab Results  Component Value Date   WBC 5.3 08/08/2022   HGB 12.2 08/08/2022   HCT 38.7 08/08/2022   PLT 229.0 08/08/2022   GLUCOSE 96 08/08/2022   CHOL 180 08/08/2022   TRIG 187.0 (H) 08/08/2022   HDL 38.20 (L) 08/08/2022   LDLCALC 105 (H) 08/08/2022   ALT 15 08/08/2022   AST 16 08/08/2022   NA 138 08/08/2022   K 4.1 08/08/2022   CL 106 08/08/2022   CREATININE 0.91 08/08/2022   BUN 9 08/08/2022   CO2 25  08/08/2022   TSH 1.15 08/08/2022   INR 1.17 05/17/2015   HGBA1C 6.2 08/08/2022   BP Readings from Last 3 Encounters:  12/19/23 118/82  11/27/23 119/78  06/07/23 130/80  Ate 10 30   breakfast sand  water   so lab non fasting this pm   ASSESSMENT AND PLAN:  Discussed the following assessment and plan:  Medication management - Plan: Basic metabolic panel, CBC with Differential/Platelet, Hemoglobin A1c, Hepatic function panel, Lipid panel, TSH, T4, free  Class 2 severe obesity due to excess calories with serious comorbidity and body mass index (BMI) of 39.0 to 39.9 in adult [facility]) - Plan: Basic metabolic panel, CBC with Differential/Platelet, Hemoglobin A1c, Hepatic function panel, Lipid panel, TSH, T4, free  Dyslipidemia (high LDL; low HDL) - Plan: Basic metabolic panel, CBC with Differential/Platelet, Hemoglobin A1c, Hepatic function panel, Lipid panel, TSH, T4, free  Hives episodes - Plan: Basic metabolic panel, CBC with Differential/Platelet, Hemoglobin A1c, Hepatic function panel, Lipid panel, TSH, T4, free, Ambulatory referral to Allergy  Need for diphtheria-tetanus-pertussis (Tdap) vaccine - Plan: Tdap vaccine greater than or equal to 7yo IM See instructions  about  lsi and  meds  tdap today  -Patient advised to return or notify health care team  if  new concerns arise.  Patient Instructions  Good to see you today  Stay hydrated . Lab today. Should be contacted about allergy referral. About the hives   reaction with possible some nuts. Add resistance exercises 2 x per week .  Carry benadryl  liquigels or chewable in interim    Neta Mends. Vedanth Sirico M.D.

## 2023-12-19 NOTE — Patient Instructions (Addendum)
 Good to see you today  Stay hydrated . Lab today. Should be contacted about allergy referral. About the hives   reaction with possible some nuts. Add resistance exercises 2 x per week .  Carry benadryl  liquigels or chewable in interim

## 2023-12-20 LAB — TSH: TSH: 1.35 u[IU]/mL (ref 0.35–5.50)

## 2023-12-20 LAB — T4, FREE: Free T4: 0.82 ng/dL (ref 0.60–1.60)

## 2023-12-24 ENCOUNTER — Encounter: Payer: Self-pay | Admitting: Internal Medicine

## 2023-12-24 NOTE — Progress Notes (Signed)
 Triglycerides are up more than last visit  . Optimize diet changes avoid sugars and processed foods and fird fatty foods    and  exercise that can help bring this down . Rest of labs  including thyroid levels are in range or insignificant out of range.

## 2024-01-02 ENCOUNTER — Telehealth: Payer: Self-pay

## 2024-01-02 NOTE — Telephone Encounter (Signed)
*  [facility]  Pharmacy Patient Advocate Encounter   Received notification from CoverMyMeds that prior authorization for Nurtec 75MG  dispersible tablets  is required/requested.   Insurance verification completed.   The patient is insured through [facility] .   Per test claim: PA required; PA submitted to above mentioned insurance via CoverMyMeds Key/confirmation #/EOC [id number] Status is pending

## 2024-01-11 NOTE — Telephone Encounter (Signed)
 Request Reference Number: [id number]. NURTEC TAB 75MG  ODT is approved through 01/01/2025. Your patient may now fill this prescription and it will be covered.

## 2024-01-25 ENCOUNTER — Encounter: Payer: Self-pay | Admitting: Internal Medicine

## 2024-01-25 ENCOUNTER — Ambulatory Visit: Payer: Self-pay | Admitting: Internal Medicine

## 2024-01-25 VITALS — BP 118/86 | HR 94 | Temp 98.2°F | Resp 20 | Ht 62.0 in | Wt 185.0 lb

## 2024-01-25 DIAGNOSIS — L508 Other urticaria: Secondary | ICD-10-CM

## 2024-01-25 DIAGNOSIS — L308 Other specified dermatitis: Secondary | ICD-10-CM

## 2024-01-25 DIAGNOSIS — J302 Other seasonal allergic rhinitis: Secondary | ICD-10-CM

## 2024-01-25 DIAGNOSIS — J3089 Other allergic rhinitis: Secondary | ICD-10-CM

## 2024-01-25 DIAGNOSIS — H1013 Acute atopic conjunctivitis, bilateral: Secondary | ICD-10-CM

## 2024-01-25 DIAGNOSIS — L309 Dermatitis, unspecified: Secondary | ICD-10-CM | POA: Insufficient documentation

## 2024-01-25 DIAGNOSIS — H1010 Acute atopic conjunctivitis, unspecified eye: Secondary | ICD-10-CM

## 2024-01-25 MED ORDER — HYDROCORTISONE 2.5 % EX OINT: TOPICAL_OINTMENT | CUTANEOUS | 3 refills | Status: AC

## 2024-01-25 MED ORDER — RYALTRIS 665-25 MCG/ACT NA SUSP: 2.0000 | Freq: Two times a day (BID) | NASAL | 5 refills | Status: AC | PRN

## 2024-01-25 MED ORDER — NEFFY 2 MG/0.1ML NA SOLN: 1.0000 | NASAL | 1 refills | Status: AC | PRN

## 2024-01-25 MED ORDER — TRIAMCINOLONE ACETONIDE 0.1 % EX OINT: TOPICAL_OINTMENT | CUTANEOUS | 1 refills | Status: AC

## 2024-01-25 NOTE — Patient Instructions (Signed)
 Rash after nuts and dairy: Recurrent rash and pruritus after nut consumption x 2. And once after charcuterie board (already somewhat avoiding dairy due to concerns regarding intolerance) Reactions somewhat delayed and previously tolerating these foods. - Schedule allergy testing for nuts and dairy next Friday at 11:15 AM. - Prescribe Neffy nasal epinephrine for emergency use. ([place]) - Advise avoidance of all nuts and dairy until testing is completed.  Seasonal rhinitis in spring. Nasal congestion, rhinorrhea, and sneezing during spring. Previous Flonase  ineffective. Ryaltris proposed for better control pending insurance. - Prescribe Ryaltris nasal spray pending insurance coverage. 2 sprays twice daily (goes to [facility]) -continue Allegra 180 mg daily as needed.  - Include environmental panel in allergy testing to identify specific triggers. - Advise discontinuation of Allegra and nasal spray three days prior to allergy testing.  Eczema Eczema with flare-ups exacerbated by heat and sun. Previous treatment with topical creams. Atopic Dermatitis:  Daily Care For Maintenance (daily and continue even once eczema controlled) - Use hypoallergenic hydrating ointment at least twice daily.  This must be done daily for control of flares. (Great options include Vaseline, CeraVe, Aquaphor, Aveeno, Cetaphil, VaniCream, etc) - Avoid detergents, soaps or lotions with fragrances/dyes - Limit showers/baths to 5 minutes and use luke warm water instead of hot, pat dry following baths, and apply moisturizer - can use steroid/non-steroid therapy creams as detailed below up to twice weekly for prevention of flares.  For Flares:(add this to maintenance therapy if needed for flares) First apply steroid/non-steroid treatment creams. Wait 5 minutes then apply moisturizer.  - Triamcinolone 0.1% to body for moderate flares-apply topically twice daily to red, raised areas of skin,  followed by moisturizer. Do NOT use on face, groin or armpits. - Hydrocortisone 2.5% to face/body-apply topically twice daily to red, raised areas of skin, followed by moisturizer  Follow up : next Friday May 9th at 11:15 AM - stop ryaltris and allegra 3 days prior to visit (1-55, PN, TN, milk) It was a pleasure meeting you in clinic today! Thank you for allowing me to participate in your care.

## 2024-01-25 NOTE — Progress Notes (Signed)
 NEW PATIENT Date of Service/Encounter:  01/25/24 Referring provider: Reginal Capra, MD Primary care provider: Reginal Capra, MD  Subjective:  [name] is a 38 y.o. female  presenting today for evaluation of acute hives and chronic rhinitis. History obtained from: chart review and patient.   Discussed the use of AI scribe software for clinical note transcription with the patient, who gave verbal consent to proceed.  History of Present Illness   [name] is a 38 year old female who presents with suspected food allergies after experiencing rashes following consumption of nuts and dairy.  She experienced her first episode of rash after consuming peanuts while on a tour in [place] Redness and breakouts appeared on her face and neck that night, accompanied by itchiness. Initially, she attributed the symptoms to different food in [place]  A second episode occurred after consuming pecans, with symptoms manifesting within an hour. She developed red, itchy bumps on her forehead and a rash on her chest, which became raised and extremely itchy the following day. She applied cortisone and Benadryl cream without relief, and the symptoms subsided after 48 hours. She also took Benadryl and Allegra during this episode.  A third episode happened after consuming cheese and crackers, with symptoms appearing within two hours. She experienced itchiness, a rash, and breakouts on her back and chin. This rash also lasted for two days. No nausea, vomiting, diarrhea, trouble breathing, wheezing, or lightheadedness occurred during these episodes.  She has since eliminated nuts and dairy from her diet. She has a history of seasonal allergies, primarily in the spring, causing nasal congestion and headaches. She previously used Flonase  but now only takes Allegra. She also has a history of eczema, primarily on her arms, chest, and back, and uses sunscreen to prevent heat-related breakouts.  She had  been avoiding dairy prior to this due to suspected intolerance-felt it increased her acne symptoms.     Chart Review:  Reviewed PCP notes from referral 12/19/23: hives after eating pistachio  Past Medical History: Past Medical History:  Diagnosis Date   Anemia, iron deficiency    With elevated hemoglobin A2   Chicken pox    Egg donor    Medication List:  Current Outpatient Medications  Medication Sig Dispense Refill   EPINEPHrine (NEFFY) 2 MG/0.1ML SOLN Place 1 spray into the nose as needed (anaphylaxis). 3 each 1   hydrocortisone 2.5 % ointment Apply topically twice daily as need to red sandpapery rash. 30 g 3   Olopatadine-Mometasone  (RYALTRIS) 665-25 MCG/ACT SUSP Place 2 each into the nose 2 (two) times daily as needed. 29 g 5   triamcinolone ointment (KENALOG) 0.1 % Apply topically twice daily to BODY as needed for red, sandpaper like rash.  Do not use on face, groin or armpits. 80 g 1   clotrimazole -betamethasone  (LOTRISONE ) cream Apply 1 Application topically daily. 45 g 2   fexofenadine (ALLEGRA) 180 MG tablet Take 180 mg by mouth daily as needed for allergies.      IRON PO Take 3 tablets by mouth at bedtime.     medroxyPROGESTERone  (DEPO-PROVERA ) 150 MG/ML injection Inject 1 mL (150 mg total) into the muscle every 3 (three) months. 1 mL 3   NURTEC 75 MG TBDP DISSOLVE 1 TABLET ON THE TONGUE DAILY AS NEEDED 8 tablet 5   tirzepatide (ZEPBOUND) 2.5 MG/0.5ML Pen Inject 5 mg into the skin once a week. Taking 5 mg/mL     topiramate  (TOPAMAX ) 50 MG tablet TAKE 3  TABLET BY MOUTH at bedtime 270 tablet 3   vitamin C (ASCORBIC ACID) 500 MG tablet Take 1,000 mg by mouth daily.     No current facility-administered medications for this visit.   Known Allergies:  No Known Allergies Past Surgical History: Past Surgical History:  Procedure Laterality Date   Denies surgical hx     tuboovarian abcess  04-2015   Family History: Family History  Problem Relation Age of Onset    Hyperlipidemia Mother    Miscarriages / [place] Mother        Mother has 1 miscarriage   Cancer Father        Lung Cancer   COPD Father    Learning disabilities Sister        Sister has Cerebral Palsy and Epilepsy   Mental retardation Sister    Eczema Sister    Cancer Maternal Grandfather        Liver Cancer   Social History: Lashaundra lives in a house built 40 years ago, no water damage, carpet in the bedroom, gassing, central AC, indoor dogs, no roaches, using dust mite covers in the bed and the pillows, no Szo exposure.  She works as a Actor x 3 years.  + HEPA filter in the home.  [place]   ROS:  All other systems negative except as noted per HPI.  Objective:  Blood pressure 118/86, pulse 94, temperature 98.2 F (36.8 C), temperature source Oral, resp. rate 20, height 5\' 2"  (1.575 m), weight 185 lb (83.9 kg), SpO2 100%. Body mass index is 33.84 kg/m. Physical Exam:  General Appearance:  Alert, cooperative, no distress, appears stated age  Head:  Normocephalic, without obvious abnormality, atraumatic  Eyes:  Conjunctiva clear, EOM's intact  Ears EACs normal bilaterally and normal TMs bilaterally  Nose: Nares normal, hypertrophic turbinates, normal mucosa, and no visible anterior polyps  Throat: Lips, tongue normal; teeth and gums normal, normal posterior oropharynx  Neck: Supple, symmetrical  Lungs:   clear to auscultation bilaterally, Respirations unlabored, no coughing  Heart:  regular rate and rhythm and no murmur, Appears well perfused  Extremities: No edema  Skin: Skin color, texture, turgor normal and no rashes or lesions on visualized portions of skin  Neurologic: No gross deficits   Diagnostics:  Labs:  Lab Orders  No laboratory test(s) ordered today     Assessment and Plan  Assessment and Plan    Rash after nuts and dairy: Recurrent rash and pruritus after nut consumption x 2. And once after charcuterie  board (already somewhat avoiding dairy due to concerns regarding intolerance) Reactions somewhat delayed and previously tolerating these foods. - Schedule allergy testing for nuts and dairy next Friday at 11:15 AM. - Prescribe Neffy nasal epinephrine for emergency use. ([place]  [facility] - Advise avoidance of all nuts and dairy until testing is completed.  Seasonal rhinitis in spring. Nasal congestion, rhinorrhea, and sneezing during spring. Previous Flonase  ineffective. Ryaltris proposed for better control pending insurance. - Prescribe Ryaltris nasal spray pending insurance coverage. 2 sprays twice daily (goes to [facility]) -continue Allegra 180 mg daily as needed.  - Include environmental panel in allergy testing to identify specific triggers. - Advise discontinuation of Allegra and nasal spray three days prior to allergy testing.  Eczema Eczema with flare-ups exacerbated by heat and sun. Previous treatment with topical creams. Atopic Dermatitis:  Daily Care For Maintenance (daily and continue even once eczema controlled) - Use hypoallergenic hydrating ointment at least  twice daily.  This must be done daily for control of flares. (Great options include Vaseline, CeraVe, Aquaphor, Aveeno, Cetaphil, VaniCream, etc) - Avoid detergents, soaps or lotions with fragrances/dyes - Limit showers/baths to 5 minutes and use luke warm water instead of hot, pat dry following baths, and apply moisturizer - can use steroid/non-steroid therapy creams as detailed below up to twice weekly for prevention of flares.  For Flares:(add this to maintenance therapy if needed for flares) First apply steroid/non-steroid treatment creams. Wait 5 minutes then apply moisturizer.  - Triamcinolone 0.1% to body for moderate flares-apply topically twice daily to red, raised areas of skin, followed by moisturizer. Do NOT use on face, groin or armpits. - Hydrocortisone 2.5% to face/body-apply topically  twice daily to red, raised areas of skin, followed by moisturizer  Follow up : next Friday May 9th at 11:15 AM - stop ryaltris and allegra 3 days prior to visit (1-55, PN, TN, milk) It was a pleasure meeting you in clinic today! Thank you for allowing me to participate in your care.  Jonathon Neighbors, MD [facility]          This note in its entirety was forwarded to the Provider who requested this consultation.  Other: Samples provided of Vanicream and Cetaphil  Thank you for your kind referral. I appreciate the opportunity to take part in Donelda's care. Please do not hesitate to contact me with questions.  Sincerely,  Jonathon Neighbors, MD [facility] 

## 2024-01-30 NOTE — Progress Notes (Unsigned)
 NEUROLOGY FOLLOW UP OFFICE NOTE  [name] [id number]  Assessment/Plan:   Migraine without aura, without status migrainosus, not intractable   Migraine prevention:  topiramate  50mg  in AM and 100mg  at night. Migraine rescue:  Nurtec ODT 75mg   Limit use of pain relievers to no more than 9 days out of the month to prevent risk of rebound or medication-overuse headache. Keep headache diary Follow up in 1 year or as needed        Subjective:  [name] is a 38 year old right-handed female who follows up for migraines.   UPDATE: Still doing well Intensity:  5-7/10 Duration:  1-2 hours with Nurtec  Frequency:  1 every 2 months   Rescue protocol:  Nurtec Current NSAIDS/analgesics:  none Current triptans:  none Current ergotamine:  none Current anti-emetic:  none Current muscle relaxants:  none Current Antihypertensive medications:  none Current Antidepressant medications:  none Current Anticonvulsant medications:  topiramate  50mg  in AM and 100mg  QHS Current anti-CGRP:  Nurtec (rescue) Current Vitamins/Herbal/Supplements:  magnesium, iron, C Current Antihistamines/Decongestants:  Flonase , Allegra Other therapy:  none Hormone/birth control:  Depo-Provera      Caffeine:  usually 1 to cup coffee daily.   Alcohol:  Rarely.  Not a trigger Diet:  Hydrates.  Does not skip meals Exercise:  Improved.   Depression:  no; Anxiety:  no Other pain:  no Sleep hygiene: Improved.   HISTORY:  Onset:  38 years old Location:  usually left-sided/orbital Quality:  pressure/throbbing Initial Intensity:  7/10 Aura:  absent Prodrome:  absent Associated symptoms:  Left nasal congestion, photophobia.  She denies nausea, vomiting, ptosis, conjunctival injection, lacrimation, phonophobia, visual disturbance, associated unilateral numbness or weakness. Initial Duration:  Within 90 min with Nurtec. Often occurs in middle of night - it wakes her up Initial Frequency:  Varies.  More  frequent in Spring-early Summer. Rarely winter.  Over the summer, 3 migraines Initial Frequency of abortive medication: 3 times in 3 months Triggers:  seasonal allergies, dehydration, heat exposure Relieving factors:  none Activity:  Needs to lay still.     Past NSAIDS/analgesics:  acetaminophen , Excedrin.  Rarely Goody Past abortive triptans:  eletriptan , rizatriptan , sumatriptan  tab Past abortive ergotamine:  none Past muscle relaxants:  none Past anti-emetic:  none Past antihypertensive medications:  none Past antidepressant medications:  none Past anticonvulsant medications:  none Past anti-CGRP:  none Past vitamins/Herbal/Supplements:  none Past antihistamines/decongestants:  none Other past therapies:  none     Family history of headache:  maternal aunt, maternal cousin  PAST MEDICAL HISTORY: Past Medical History:  Diagnosis Date   Anemia, iron deficiency    With elevated hemoglobin A2   Chicken pox    Egg donor     MEDICATIONS: Current Outpatient Medications on File Prior to Visit  Medication Sig Dispense Refill   clotrimazole -betamethasone  (LOTRISONE ) cream Apply 1 Application topically daily. 45 g 2   EPINEPHrine (NEFFY) 2 MG/0.1ML SOLN Place 1 spray into the nose as needed (anaphylaxis). 3 each 1   fexofenadine (ALLEGRA) 180 MG tablet Take 180 mg by mouth daily as needed for allergies.      hydrocortisone 2.5 % ointment Apply topically twice daily as need to red sandpapery rash. 30 g 3   IRON PO Take 3 tablets by mouth at bedtime.     medroxyPROGESTERone  (DEPO-PROVERA ) 150 MG/ML injection Inject 1 mL (150 mg total) into the muscle every 3 (three) months. 1 mL 3   NURTEC 75 MG TBDP DISSOLVE 1 TABLET ON  THE TONGUE DAILY AS NEEDED 8 tablet 5   Olopatadine-Mometasone  (RYALTRIS) 665-25 MCG/ACT SUSP Place 2 each into the nose 2 (two) times daily as needed. 29 g 5   tirzepatide (ZEPBOUND) 2.5 MG/0.5ML Pen Inject 5 mg into the skin once a week. Taking 5 mg/mL      topiramate  (TOPAMAX ) 50 MG tablet TAKE 3 TABLET BY MOUTH at bedtime 270 tablet 3   triamcinolone ointment (KENALOG) 0.1 % Apply topically twice daily to BODY as needed for red, sandpaper like rash.  Do not use on face, groin or armpits. 80 g 1   vitamin C (ASCORBIC ACID) 500 MG tablet Take 1,000 mg by mouth daily.     No current facility-administered medications on file prior to visit.    ALLERGIES: No Known Allergies  FAMILY HISTORY: Family History  Problem Relation Age of Onset   Hyperlipidemia Mother    Miscarriages / [place] Mother        Mother has 1 miscarriage   Cancer Father        Lung Cancer   COPD Father    Learning disabilities Sister        Sister has Cerebral Palsy and Epilepsy   Mental retardation Sister    Eczema Sister    Cancer Maternal Grandfather        Liver Cancer      Objective:  Blood pressure 114/78, pulse 98, height 5\' 5"  (1.651 m), weight 185 lb (83.9 kg), SpO2 98%. General: No acute distress.  Patient appears well-groomed.   Head:  Normocephalic/atraumatic Neck:  Supple.  No paraspinal tenderness.  Full range of motion. Heart:  Regular rate and rhythm. Neuro:  Alert and oriented.  Speech fluent and not dysarthric.  Language intact.  CN II-XII intact.  Bulk and tone normal.  Muscle strength 5/5 throughout.  Deep tendon reflexes 2+ throughout.  Gait normal.  Romberg negative.     Janne Members, DO  CC: Daphine Eagle, MD

## 2024-01-31 ENCOUNTER — Ambulatory Visit: Payer: 59 | Admitting: Neurology

## 2024-01-31 ENCOUNTER — Other Ambulatory Visit: Payer: Self-pay | Admitting: Neurology

## 2024-01-31 ENCOUNTER — Encounter: Payer: Self-pay | Admitting: Neurology

## 2024-01-31 VITALS — BP 114/78 | HR 98 | Ht 65.0 in | Wt 185.0 lb

## 2024-01-31 DIAGNOSIS — G43009 Migraine without aura, not intractable, without status migrainosus: Secondary | ICD-10-CM | POA: Diagnosis not present

## 2024-01-31 MED ORDER — TOPIRAMATE 50 MG PO TABS: ORAL_TABLET | ORAL | 3 refills | Status: DC

## 2024-01-31 MED ORDER — NURTEC 75 MG PO TBDP: ORAL_TABLET | ORAL | 11 refills | Status: AC

## 2024-02-01 ENCOUNTER — Ambulatory Visit: Admitting: Internal Medicine

## 2024-02-01 ENCOUNTER — Encounter: Payer: Self-pay | Admitting: Internal Medicine

## 2024-02-01 DIAGNOSIS — J3089 Other allergic rhinitis: Secondary | ICD-10-CM | POA: Diagnosis not present

## 2024-02-01 DIAGNOSIS — L508 Other urticaria: Secondary | ICD-10-CM | POA: Diagnosis not present

## 2024-02-01 DIAGNOSIS — H1010 Acute atopic conjunctivitis, unspecified eye: Secondary | ICD-10-CM

## 2024-02-01 DIAGNOSIS — H1013 Acute atopic conjunctivitis, bilateral: Secondary | ICD-10-CM | POA: Diagnosis not present

## 2024-02-01 DIAGNOSIS — J302 Other seasonal allergic rhinitis: Secondary | ICD-10-CM

## 2024-02-01 NOTE — Patient Instructions (Addendum)
 Rash after nuts and dairy: Recurrent rash and pruritus after nut consumption x 2. And once after charcuterie board (already somewhat avoiding dairy due to concerns regarding intolerance) Reactions somewhat delayed and previously tolerating these foods. - Skin testing today for tree nuts peanut and milk were all negative.  Will confirm with lab work. - Prescribe Neffy nasal epinephrine for emergency use. ([place]  [facility] - Advise avoidance of all nuts and dairy until testing is completed.   Seasonal rhinitis in spring. Nasal congestion, rhinorrhea, and sneezing during spring. Previous Flonase  ineffective. Ryaltris  proposed for better control pending insurance. - Prescribe Ryaltris  nasal spray pending insurance coverage. 2 sprays twice daily (goes to [facility]) -continue Allegra 180 mg daily as needed.  - Allergy testing today was negative on skin testing, borderline to dust mite on IDs.  Eczema Eczema with flare-ups exacerbated by heat and sun. Previous treatment with topical creams. Atopic Dermatitis:  Daily Care For Maintenance (daily and continue even once eczema controlled) - Use hypoallergenic hydrating ointment at least twice daily.  This must be done daily for control of flares. (Great options include Vaseline, CeraVe, Aquaphor, Aveeno, Cetaphil, VaniCream, etc) - Avoid detergents, soaps or lotions with fragrances/dyes - Limit showers/baths to 5 minutes and use luke warm water instead of hot, pat dry following baths, and apply moisturizer - can use steroid/non-steroid therapy creams as detailed below up to twice weekly for prevention of flares.  For Flares:(add this to maintenance therapy if needed for flares) First apply steroid/non-steroid treatment creams. Wait 5 minutes then apply moisturizer.  - Triamcinolone  0.1% to body for moderate flares-apply topically twice daily to red, raised areas of skin, followed by moisturizer. Do NOT use on face, groin or  armpits. - Hydrocortisone  2.5% to face/body-apply topically twice daily to red, raised areas of skin, followed by moisturizer  Follow up : 3 months sooner if needed It was a pleasure seeing you again in clinic today! Thank you for allowing me to participate in your care.  DUST MITE AVOIDANCE MEASURES:  There are three main measures that need and can be taken to avoid house dust mites:  Reduce accumulation of dust in general -reduce furniture, clothing, carpeting, books, stuffed animals, especially in bedroom  Separate yourself from the dust -use pillow and mattress encasements (can be found at stores such as [employer] or online) -avoid direct exposure to air condition flow -use a HEPA filter device, especially in the bedroom; you can also use a HEPA filter vacuum cleaner -wipe dust with a moist towel instead of a dry towel or broom when cleaning  Decrease mites and/or their secretions -wash clothing and linen and stuffed animals at highest temperature possible, at least every 2 weeks -stuffed animals can also be placed in a bag and put in a freezer overnight  Despite the above measures, it is impossible to eliminate dust mites or their allergen completely from your home.  With the above measures the burden of mites in your home can be diminished, with the goal of minimizing your allergic symptoms.  Success will be reached only when implementing and using all means together.

## 2024-02-01 NOTE — Progress Notes (Signed)
 Date of Service/Encounter:  02/01/24  Allergy testing appointment   Initial visit on 01/25/23, seen for atopic dermatitis, seasonal rhinitis in spring, rash after nuts and dairy.  Please see that note for additional details.  Today reports for allergy diagnostic testing:    DIAGNOSTICS:  Skin Testing: Environmental allergy panel and select foods. Adequate positive and negative controls. Results discussed with patient/family.  Airborne Adult Perc - 02/01/24 1210     Time Antigen Placed 1210    Allergen Manufacturer Floyd Hutchinson    Location Back    Number of Test 55    1. Control-Buffer 50% Glycerol Negative    2. Control-Histamine 3+    3. Bahia Negative    4. [place]ories Negative    5. Johnson Negative    6. [place]  Blue Negative    7. Meadow Fescue Negative    8. Perennial Rye Negative    9. Timothy Negative    10. Ragweed Mix Negative    11. Cocklebur Negative    12. Plantain,  English Negative    13. Baccharis Negative    14. Dog Fennel Negative    15. Russian Thistle Negative    16. Lamb's Quarters Negative    17. Sheep Sorrell Negative    18. Rough Pigweed Negative    19. Marsh Elder, Rough Negative    20. Mugwort, Common Negative    21. Box, Elder Negative    22. Cedar, red Negative    23. Sweet Gum Negative    24. Pecan Pollen Negative    25. Pine Mix Negative    26. Walnut, Black Pollen Negative    27. Red Mulberry Negative    28. Ash Mix Negative    29. Birch Mix Negative    30. Beech American Negative    31. Cottonwood, Guinea[place]Bissau Negative    32. Hickory, White Negative    33. Maple Mix Negative    34. Oak, [place] Mix Negative    35. Sycamore Eastern Negative    36. Alternaria Alternata Negative    37. Cladosporium Herbarum Negative    38. Aspergillus Mix Negative    39. Penicillium Mix Negative    40. Bipolaris Sorokiniana (Helminthosporium) Negative    41. Drechslera Spicifera (Curvularia) Negative    42. Mucor Plumbeus Negative    43. Fusarium  Moniliforme Negative    44. Aureobasidium Pullulans (pullulara) Negative    45. Rhizopus Oryzae Negative    46. Botrytis Cinera Negative    47. Epicoccum Nigrum Negative    48. Phoma Betae Negative    49. Dust Mite Mix Negative    50. Cat Hair 10,000 BAU/ml Negative    51.  Dog Epithelia Negative    52. Mixed Feathers Negative    53. Horse Epithelia Negative    54. Cockroach, German Negative    55. Tobacco Leaf Negative             Intradermal - 02/01/24 1211     Time Antigen Placed 1211    Allergen Manufacturer Floyd Hutchinson    Location Arm    Number of Test 16    Control Negative    Bahia Negative    French Southern Territories Negative    Johnson Negative    7 Grass Negative    Ragweed Mix Negative    Weed Mix Negative    Tree Mix Negative    Mold 1 Negative    Mold 2 Negative    Mold 3 Negative    Mold 4 Negative  Mite Mix 2+    Cat Negative    Dog Negative    Cockroach Negative             Food Adult Perc - 02/01/24 1200     Time Antigen Placed 1210    Allergen Manufacturer Floyd Hutchinson    Location Back    Number of allergen test 10    1. Peanut Negative    5. Milk, Cow Negative    10. Cashew Negative    11. Walnut Food Negative    12. Almond Negative    13. Hazelnut Negative    14. Pecan Food Negative    15. Pistachio Negative    16. [place] Nut Negative    17. Coconut Negative    Comments n             Allergy testing results were read and interpreted by myself, documented by clinical staff.  Patient provided with copy of allergy testing along with avoidance measures when indicated.   Jonathon Neighbors, MD  [facility] 

## 2024-02-15 ENCOUNTER — Ambulatory Visit: Payer: Self-pay | Admitting: Internal Medicine

## 2024-02-15 LAB — ALLERGENS, ZONE 2

## 2024-02-15 LAB — IGE NUT PROF. W/COMPONENT RFLX
F017-IgE Hazelnut (Filbert): <0.1 kU/L
F018-IgE Brazil Nut: <0.1 kU/L
F020-IgE Almond: <0.1 kU/L
F202-IgE Cashew Nut: <0.1 kU/L
F203-IgE Pistachio Nut: <0.1 kU/L
F256-IgE Walnut: <0.1 kU/L
Macadamia Nut, IgE: <0.1 kU/L
Peanut, IgE: <0.1 kU/L
Pecan Nut IgE: <0.1 kU/L

## 2024-02-15 LAB — ALLERGEN MILK: Milk IgE: <0.1 kU/L

## 2024-02-21 ENCOUNTER — Ambulatory Visit

## 2024-02-21 VITALS — BP 103/76 | HR 87 | Ht 62.0 in | Wt 184.0 lb

## 2024-02-21 DIAGNOSIS — Z3042 Encounter for surveillance of injectable contraceptive: Secondary | ICD-10-CM

## 2024-02-21 DIAGNOSIS — Z3202 Encounter for pregnancy test, result negative: Secondary | ICD-10-CM

## 2024-02-21 LAB — POCT URINE PREGNANCY: Preg Test, Ur: NEGATIVE

## 2024-02-21 NOTE — Telephone Encounter (Signed)
Tried calling x3 no answer

## 2024-02-21 NOTE — Progress Notes (Signed)
 Date last pap:03/16/2021 . Last Depo-Provera : 11/27/2023. Side Effects if any: No. Serum HCG indicated? NA. Depo-Provera  150 mg IM given by: [facility] RN. Next appointment due 05/08/24-05/22/2024.

## 2024-04-02 MED ORDER — MEDROXYPROGESTERONE ACETATE 150 MG/ML IM SUSY
150.0000 mg | PREFILLED_SYRINGE | Freq: Once | INTRAMUSCULAR | Status: AC
  Administered 2024-02-21: 150 mg via INTRAMUSCULAR

## 2024-04-02 NOTE — Addendum Note (Signed)
 Addended by: VENUS AMERICA A on: 04/02/2024 02:19 PM   Modules accepted: Orders

## 2024-05-07 ENCOUNTER — Ambulatory Visit: Admitting: Internal Medicine

## 2024-05-15 ENCOUNTER — Other Ambulatory Visit: Payer: Self-pay

## 2024-05-15 ENCOUNTER — Ambulatory Visit: Admitting: Internal Medicine

## 2024-05-15 ENCOUNTER — Encounter: Payer: Self-pay | Admitting: Internal Medicine

## 2024-05-15 VITALS — BP 120/78 | HR 85 | Temp 98.4°F | Resp 18 | Ht 61.0 in | Wt 170.8 lb

## 2024-05-15 DIAGNOSIS — J3089 Other allergic rhinitis: Secondary | ICD-10-CM

## 2024-05-15 DIAGNOSIS — H1013 Acute atopic conjunctivitis, bilateral: Secondary | ICD-10-CM

## 2024-05-15 DIAGNOSIS — L508 Other urticaria: Secondary | ICD-10-CM | POA: Diagnosis not present

## 2024-05-15 DIAGNOSIS — L308 Other specified dermatitis: Secondary | ICD-10-CM | POA: Diagnosis not present

## 2024-05-15 DIAGNOSIS — J302 Other seasonal allergic rhinitis: Secondary | ICD-10-CM | POA: Diagnosis not present

## 2024-05-15 NOTE — Patient Instructions (Signed)
 Rash after nuts and dairy: Stable Initial visit: Recurrent rash and pruritus after nut consumption x 2. And once after charcuterie board (already somewhat avoiding dairy due to concerns regarding intolerance) Reactions somewhat delayed and previously tolerating these foods. - Skin testing 02/01/24 for tree nuts peanut and milk were all negative.  Labs also negative.  - Consider an oral challenge to tree nuts and dairy to understand your risk better. - For now, continue to carry Neffy  nasal epinephrine  for emergency use in the event of severe reaction - Advise avoidance of all nuts and dairy   Mixed Rhinitis: Perennial to dust mite and likely vasomotor component Initial visit: Nasal congestion, rhinorrhea, and sneezing during spring. Previous Flonase  ineffective.  Ryaltris  has been effective. - Ryaltris  nasal spray 2 sprays twice daily as needed -continue Allegra 180 mg daily as needed. Can take up to twice daily on  - Allergy  testing 02/01/24 was negative on skin testing, borderline to dust mite on IDs. Labwork also borderline to dust mites  Atopic dermatitis: Well-controlled Eczema with flare-ups exacerbated by heat and sun. Previous treatment with topical creams. Daily Care For Maintenance (daily and continue even once eczema controlled) - Use hypoallergenic hydrating ointment at least twice daily.  This must be done daily for control of flares. (Great options include Vaseline, CeraVe, Aquaphor, Aveeno, Cetaphil, VaniCream, etc) - Avoid detergents, soaps or lotions with fragrances/dyes - Limit showers/baths to 5 minutes and use luke warm water instead of hot, pat dry following baths, and apply moisturizer - can use steroid/non-steroid therapy creams as detailed below up to twice weekly for prevention of flares.  For Flares:(add this to maintenance therapy if needed for flares) First apply steroid/non-steroid treatment creams. Wait 5 minutes then apply moisturizer.  - Triamcinolone  0.1% to  body for moderate flares-apply topically twice daily to red, raised areas of skin, followed by moisturizer. Do NOT use on face, groin or armpits. - Hydrocortisone  2.5% to face/body-apply topically twice daily to red, raised areas of skin, followed by moisturizer  Follow up : 6 months sooner if needed It was a pleasure seeing you again in clinic today! Thank you for allowing me to participate in your care.  DUST MITE AVOIDANCE MEASURES:  There are three main measures that need and can be taken to avoid house dust mites:  Reduce accumulation of dust in general -reduce furniture, clothing, carpeting, books, stuffed animals, especially in bedroom  Separate yourself from the dust -use pillow and mattress encasements (can be found at stores such as [employer] or online) -avoid direct exposure to air condition flow -use a HEPA filter device, especially in the bedroom; you can also use a HEPA filter vacuum cleaner -wipe dust with a moist towel instead of a dry towel or broom when cleaning  Decrease mites and/or their secretions -wash clothing and linen and stuffed animals at highest temperature possible, at least every 2 weeks -stuffed animals can also be placed in a bag and put in a freezer overnight  Despite the above measures, it is impossible to eliminate dust mites or their allergen completely from your home.  With the above measures the burden of mites in your home can be diminished, with the goal of minimizing your allergic symptoms.  Success will be reached only when implementing and using all means together.

## 2024-05-15 NOTE — Progress Notes (Signed)
 FOLLOW UP Date of Service/Encounter:   05/15/2024  Subjective:  [name] (DOB: [birth year]) is a 38 y.o. female who [facility] in re-evaluation of the following: Mixed rhinitis, atopic dermatitis, and adverse food reactions History obtained from: chart review and patient.  For Review, LV was on 02/01/24  with Dr.Raphel Stickles seen for allergy  testing. See below for summary of history and diagnostics.   ----------------------------------------------------- Pertinent History/Diagnostics:  Rash after nuts and dairy: Recurrent rash and pruritus after nut consumption x 2. And once after charcuterie board (already somewhat avoiding dairy due to concerns regarding intolerance) Reactions somewhat delayed and previously tolerating these foods. - Skin testing 02/01/24 for tree nuts peanut and milk were all negative. Labwork confirmed negative to tree nut and milk Mixed rhiniitis-perennial allergic to DM and vasomotor component suspected. Nasal congestion, rhinorrhea, and sneezing during spring. Previous Flonase  ineffective.  - Allergy  testing 02/01/24 was negative on skin testing, borderline to dust mite on IDs. Bloodwork + negative Eczema Eczema with flare-ups exacerbated by heat and sun. Previous treatment with topical creams. --------------------------------------------------- Today presents for follow-up. Discussed the use of AI scribe software for clinical note transcription with the patient, who gave verbal consent to proceed.  History of Present Illness [name] is a 38 year old female who presents with allergy  symptoms and skin rashes.  Allergic symptoms - Persistent sneezing and minor allergy  symptoms despite daily use of Allegra and nighttime Benadryl supplementation - Uses Ryaltris  nasal spray for allergy  relief - Blood work negative for tree nuts, peanuts, and milk allergies, but continues to avoid these foods due to previous symptoms  after consumption  Dermatologic manifestations - Minor rashes and facial breakouts, particularly periorbital (around the eyes) - Uses cortisone cream for rash management - Believes dietary modifications (reduction of greasy foods and sugar) have contributed to skin improvement   All medications reviewed by clinical staff and updated in chart. No new pertinent medical or surgical history except as noted in HPI.  ROS: All others negative except as noted per HPI.   Objective:  BP 120/78 (BP Location: Left Arm, Patient Position: Sitting, Cuff Size: Normal)   Pulse 85   Temp 98.4 F (36.9 C)   Resp 18   Ht 5' 1 (1.549 m)   Wt 170 lb 12.8 oz (77.5 kg)   SpO2 100%   BMI 32.27 kg/m  Body mass index is 32.27 kg/m. Physical Exam: General Appearance:  Alert, cooperative, no distress, appears stated age  Head:  Normocephalic, without obvious abnormality, atraumatic  Eyes:  Conjunctiva clear, EOM's intact  Ears EACs normal bilaterally and normal TMs bilaterally  Nose: Nares normal, hypertrophic turbinates, normal mucosa, and no visible anterior polyps  Throat: Lips, tongue normal; teeth and gums normal, normal posterior oropharynx  Neck: Supple, symmetrical  Lungs:   clear to auscultation bilaterally, Respirations unlabored, no coughing  Heart:  regular rate and rhythm and no murmur, Appears well perfused  Extremities: No edema  Skin: Skin color, texture, turgor normal and no rashes or lesions on visualized portions of skin  Neurologic: No gross deficits   Labs:  Lab Orders  No laboratory test(s) ordered today   Assessment/Plan   Rash after nuts and dairy: Stable Initial visit: Recurrent rash and pruritus after nut consumption x 2. And once after charcuterie board (already somewhat avoiding dairy due to concerns regarding intolerance) Reactions somewhat delayed and previously tolerating these foods. - Skin testing 02/01/24 for tree nuts peanut and milk  were all negative.  Labs  also negative.  - Consider an oral challenge to tree nuts and dairy to understand your risk better. - For now, continue to carry Neffy  nasal epinephrine  for emergency use in the event of severe reaction - Advise avoidance of all nuts and dairy   Mixed Rhinitis: Perennial to dust mite and likely vasomotor component Initial visit: Nasal congestion, rhinorrhea, and sneezing during spring. Previous Flonase  ineffective.  Ryaltris  has been effective. - Ryaltris  nasal spray 2 sprays twice daily as needed -continue Allegra 180 mg daily as needed. Can take up to twice daily on  - Allergy  testing 02/01/24 was negative on skin testing, borderline to dust mite on IDs. Labwork also borderline to dust mites  Atopic dermatitis: Well-controlled Eczema with flare-ups exacerbated by heat and sun. Previous treatment with topical creams. Daily Care For Maintenance (daily and continue even once eczema controlled) - Use hypoallergenic hydrating ointment at least twice daily.  This must be done daily for control of flares. (Great options include Vaseline, CeraVe, Aquaphor, Aveeno, Cetaphil, VaniCream, etc) - Avoid detergents, soaps or lotions with fragrances/dyes - Limit showers/baths to 5 minutes and use luke warm water instead of hot, pat dry following baths, and apply moisturizer - can use steroid/non-steroid therapy creams as detailed below up to twice weekly for prevention of flares.  For Flares:(add this to maintenance therapy if needed for flares) First apply steroid/non-steroid treatment creams. Wait 5 minutes then apply moisturizer.  - Triamcinolone  0.1% to body for moderate flares-apply topically twice daily to red, raised areas of skin, followed by moisturizer. Do NOT use on face, groin or armpits. - Hydrocortisone  2.5% to face/body-apply topically twice daily to red, raised areas of skin, followed by moisturizer  Follow up : 6 months sooner if needed It was a pleasure seeing you again in clinic  today! Thank you for allowing me to participate in your care.  Other: none  Rocky Endow, MD  [facility] 

## 2024-05-19 ENCOUNTER — Other Ambulatory Visit (HOSPITAL_COMMUNITY)
Admission: RE | Admit: 2024-05-19 | Discharge: 2024-05-19 | Disposition: A | Discharge status: Home / Self Care | Source: Ambulatory Visit | Attending: Obstetrics & Gynecology | Admitting: Obstetrics & Gynecology

## 2024-05-19 ENCOUNTER — Encounter: Payer: Self-pay | Admitting: Obstetrics & Gynecology

## 2024-05-19 ENCOUNTER — Ambulatory Visit (INDEPENDENT_AMBULATORY_CARE_PROVIDER_SITE_OTHER): Admitting: Obstetrics & Gynecology

## 2024-05-19 VITALS — BP 112/81 | HR 80 | Ht 61.0 in | Wt 169.0 lb

## 2024-05-19 DIAGNOSIS — Z1231 Encounter for screening mammogram for malignant neoplasm of breast: Secondary | ICD-10-CM | POA: Diagnosis not present

## 2024-05-19 DIAGNOSIS — Z3042 Encounter for surveillance of injectable contraceptive: Secondary | ICD-10-CM | POA: Diagnosis not present

## 2024-05-19 DIAGNOSIS — Z01419 Encounter for gynecological examination (general) (routine) without abnormal findings: Secondary | ICD-10-CM | POA: Insufficient documentation

## 2024-05-19 DIAGNOSIS — Z113 Encounter for screening for infections with a predominantly sexual mode of transmission: Secondary | ICD-10-CM

## 2024-05-19 DIAGNOSIS — Z1331 Encounter for screening for depression: Secondary | ICD-10-CM | POA: Diagnosis not present

## 2024-05-19 MED ORDER — MEDROXYPROGESTERONE ACETATE 150 MG/ML IM SUSP
150.0000 mg | Freq: Once | INTRAMUSCULAR | Status: AC
  Administered 2024-05-19: 150 mg via INTRAMUSCULAR

## 2024-05-19 NOTE — Progress Notes (Signed)
 GYNECOLOGY ANNUAL PREVENTATIVE CARE ENCOUNTER NOTE  History:    [name] is a 38 y.o. G0P0000 female here for a routine annual gynecologic exam.  Current complaints: none.  Satisfied with Depo Provera  contraception, received injection today. Desires pap and STI screen today.  Denies abnormal vaginal bleeding, discharge, pelvic pain, problems with intercourse or other gynecologic concerns.  Gynecologic History No LMP recorded (lmp unknown). Patient has had an injection. Contraception: Depo-Provera  injections Last Pap: 03/16/2021. Result was normal with negative HPV  Obstetric History OB History  Gravida Para Term Preterm AB Living  0 0 0 0 0 0  SAB IAB Ectopic Multiple Live Births  0 0 0 0     Past Medical History:  Diagnosis Date   Anemia, iron deficiency    With elevated hemoglobin A2   Chicken pox    Egg donor     Past Surgical History:  Procedure Laterality Date   Denies surgical hx     tuboovarian abcess  04-2015    Current Outpatient Medications on File Prior to Visit  Medication Sig Dispense Refill   clotrimazole -betamethasone  (LOTRISONE ) cream Apply 1 Application topically daily. 45 g 2   fexofenadine (ALLEGRA) 180 MG tablet Take 180 mg by mouth daily as needed for allergies.      fluticasone  (FLONASE ) 50 MCG/ACT nasal spray 1 spray.     hydrocortisone  2.5 % ointment Apply topically twice daily as need to red sandpapery rash. 30 g 3   IRON PO Take 3 tablets by mouth at bedtime.     medroxyPROGESTERone  (DEPO-PROVERA ) 150 MG/ML injection Inject 1 mL (150 mg total) into the muscle every 3 (three) months. 1 mL 3   Olopatadine-Mometasone  (RYALTRIS ) 665-25 MCG/ACT SUSP Place 2 each into the nose 2 (two) times daily as needed. 29 g 5   Rimegepant Sulfate (NURTEC) 75 MG TBDP DISSOLVE 1 TABLET ON THE TONGUE DAILY AS NEEDEDDISSOLVE 1 TABLET ON THE TONGUE DAILY AS NEEDED 8 tablet 11   tirzepatide (ZEPBOUND) 2.5 MG/0.5ML Pen Inject 5 mg into the skin once a week. Taking  5 mg/mL     topiramate  (TOPAMAX ) 50 MG tablet TAKE 3 TABLETS BY MOUTH AT  BEDTIME 270 tablet 3   triamcinolone  ointment (KENALOG ) 0.1 % Apply topically twice daily to BODY as needed for red, sandpaper like rash.  Do not use on face, groin or armpits. 80 g 1   vitamin C (ASCORBIC ACID) 500 MG tablet Take 1,000 mg by mouth daily.     EPINEPHrine  (NEFFY ) 2 MG/0.1ML SOLN Place 1 spray into the nose as needed (anaphylaxis). (Patient not taking: Reported on 05/19/2024) 3 each 1   No current facility-administered medications on file prior to visit.    No Known Allergies  Social History:  reports that she has never smoked. She has never used smokeless tobacco. She reports that she does not drink alcohol and does not use drugs.  Family History  Problem Relation Age of Onset   Hyperlipidemia Mother    Miscarriages / [place] Mother        Mother has 1 miscarriage   Cancer Father        Lung Cancer   COPD Father    Learning disabilities Sister        Sister has Cerebral Palsy and Epilepsy   Mental retardation Sister    Eczema Sister    Cancer Maternal Grandfather        Liver Cancer    The following portions of the patient's  history were reviewed and updated as appropriate: allergies, current medications, past family history, past medical history, past social history, past surgical history and problem list.  Review of Systems Pertinent items noted in HPI and remainder of comprehensive ROS otherwise negative.  Physical Exam:  BP 112/81 (BP Location: Left Arm, Patient Position: Sitting, Cuff Size: Normal)   Pulse 80   Ht 5' 1 (1.549 m)   Wt 169 lb (76.7 kg)   LMP  (LMP Unknown)   BMI 31.93 kg/m  CONSTITUTIONAL: Well-developed, well-nourished female in no acute distress.  HENT:  Normocephalic, atraumatic, External right and left ear normal.  EYES: Conjunctivae and EOM are normal. Pupils are equal, round, and reactive to light. No scleral icterus.  NECK: Normal range of motion,  supple, no masses observed. SKIN: Skin is warm and dry. No rash noted. Not diaphoretic. No erythema. No pallor. MUSCULOSKELETAL: Normal range of motion. No tenderness.  No cyanosis, clubbing, or edema. NEUROLOGIC: Alert and oriented to person, place, and time. Normal muscle tone coordination.  PSYCHIATRIC: Normal mood and affect. Normal behavior. Normal judgment and thought content. CARDIOVASCULAR: Normal heart rate noted, regular rhythm RESPIRATORY: Clear to auscultation bilaterally. Effort and breath sounds normal, no problems with respiration noted. BREASTS: Symmetric in size. No masses, tenderness, skin changes, nipple drainage, or lymphadenopathy bilaterally. Performed in the presence of a chaperone. ABDOMEN: Soft, no distention noted.  No tenderness, rebound or guarding.  PELVIC: Normal appearing external genitalia and urethral meatus; normal appearing vaginal mucosa and cervix.  No abnormal vaginal discharge noted.  Pap smear obtained, there was significant bleeding from endocervix  during pap ameliorated with silver nitrate application.  Normal uterine size, no other palpable masses, no uterine or adnexal tenderness.  Performed in the presence of a chaperone.  Assessment and Plan:     1. Encounter for management and injection of depo-Provera  She will return every three months for Depo Provera  Counseled about the FDA warning about bone loss on Depo Provera  taken more than 2 years. Losses in bone mineral density are temporary and reverse after discontinuation of Depo Provera . There is no evidence of an increase in risk of fractures. Encouraged calcium (1200 mg daily),vitamin D (800 IU daily) supplements, weight bearing exercises to help with the risk of bone loss.  2. Routine screening for STI (sexually transmitted infection) STI screen done, will follow up results and manage accordingly. - Cytology - PAP ancillary testing - RPR+HBsAg+HCVAb+HIV  3. Breast cancer screening by  mammogram Mammogram scheduled for breast cancer screening, to be scheduled after she turns 38. - MM 3D SCREENING MAMMOGRAM BILATERAL BREAST; Future  4. Well female exam with routine gynecological exam (Primary) - Cytology - PAP Will follow up results of pap smear and manage accordingly. Routine preventative health maintenance measures emphasized. Please refer to After Visit Summary for other counseling recommendations.      GLORIS HUGGER, MD, FACOG Obstetrician & Gynecologist, [facility] for [employer], [facility]

## 2024-05-19 NOTE — Progress Notes (Signed)
 Date last pap: 05/02/2023. Last Depo-Provera : 02/21/2024. Side Effects if any: NA. Serum HCG indicated? NA. Depo-Provera  150 mg IM given by: Shawnee Fleet, CMA . Next appointment due 08/04/2024-08/18/2024.   Pt tolerated injection well without any side effects. Shawnee Fleet, CMA

## 2024-05-20 LAB — RPR+HBSAG+HCVAB+...
HIV Screen 4th Generation wRfx: NONREACTIVE
Hep C Virus Ab: NONREACTIVE
Hepatitis B Surface Ag: NEGATIVE
RPR Ser Ql: NONREACTIVE

## 2024-05-21 ENCOUNTER — Ambulatory Visit: Payer: Self-pay | Admitting: Obstetrics & Gynecology

## 2024-05-21 LAB — CYTOLOGY - PAP
Chlamydia: NEGATIVE
Comment: NEGATIVE
Comment: NEGATIVE
Comment: NEGATIVE
Comment: NORMAL
Diagnosis: NEGATIVE
Diagnosis: REACTIVE
High risk HPV: NEGATIVE
Neisseria Gonorrhea: NEGATIVE
Trichomonas: NEGATIVE

## 2024-06-18 ENCOUNTER — Telehealth (HOSPITAL_BASED_OUTPATIENT_CLINIC_OR_DEPARTMENT_OTHER): Payer: Self-pay

## 2024-06-23 ENCOUNTER — Ambulatory Visit (HOSPITAL_BASED_OUTPATIENT_CLINIC_OR_DEPARTMENT_OTHER)
Admission: RE | Admit: 2024-06-23 | Discharge: 2024-06-23 | Disposition: A | Discharge status: Home / Self Care | Source: Ambulatory Visit | Attending: Obstetrics & Gynecology | Admitting: Obstetrics & Gynecology

## 2024-06-23 ENCOUNTER — Encounter (HOSPITAL_BASED_OUTPATIENT_CLINIC_OR_DEPARTMENT_OTHER): Payer: Self-pay

## 2024-06-23 DIAGNOSIS — Z1231 Encounter for screening mammogram for malignant neoplasm of breast: Secondary | ICD-10-CM | POA: Diagnosis present

## 2024-08-07 ENCOUNTER — Ambulatory Visit

## 2024-08-07 ENCOUNTER — Other Ambulatory Visit: Payer: Self-pay

## 2024-08-07 DIAGNOSIS — Z3042 Encounter for surveillance of injectable contraceptive: Secondary | ICD-10-CM

## 2024-08-07 MED ORDER — MEDROXYPROGESTERONE ACETATE 150 MG/ML IM SUSP: 150.0000 mg | INTRAMUSCULAR | 3 refills | Status: AC

## 2024-08-14 ENCOUNTER — Telehealth: Payer: Self-pay

## 2024-08-14 NOTE — Telephone Encounter (Signed)
 Patient left a voicemail to reschedule her depo visit that she missed last week. Returned patient's phone call and left a voicemail for her to call back.

## 2024-08-25 ENCOUNTER — Ambulatory Visit

## 2024-08-25 VITALS — BP 120/88 | HR 76 | Ht 61.0 in | Wt 168.1 lb

## 2024-08-25 DIAGNOSIS — Z3202 Encounter for pregnancy test, result negative: Secondary | ICD-10-CM

## 2024-08-25 DIAGNOSIS — Z3042 Encounter for surveillance of injectable contraceptive: Secondary | ICD-10-CM | POA: Diagnosis not present

## 2024-08-25 LAB — POCT URINE PREGNANCY: Preg Test, Ur: NEGATIVE

## 2024-08-25 MED ORDER — MEDROXYPROGESTERONE ACETATE 150 MG/ML IM SUSY
150.0000 mg | PREFILLED_SYRINGE | Freq: Once | INTRAMUSCULAR | Status: AC
  Administered 2024-08-25: 150 mg via INTRAMUSCULAR

## 2024-08-25 NOTE — Progress Notes (Signed)
 Date last pap: 05/19/24. Last Depo-Provera : 05/19/24. Side Effects if any: none. Serum HCG indicated? N/A. Depo-Provera  150 mg IM given by: Holli Dick/CMA/Rahshawn Remo [facility]. Next appointment due 11/10/24-11/24/24.

## 2024-11-03 ENCOUNTER — Ambulatory Visit

## 2024-11-19 ENCOUNTER — Ambulatory Visit: Admitting: Internal Medicine

## 2025-02-02 ENCOUNTER — Ambulatory Visit: Admitting: Neurology
